# Patient Record
Sex: Female | Born: 2000 | Race: Black or African American | Hispanic: No | Marital: Single | Smoking: Never smoker
Health system: Southern US, Community
[De-identification: ages and names within clinical notes are randomized; demographics above are authoritative.]

## PROBLEM LIST (undated history)

## (undated) DIAGNOSIS — R569 Unspecified convulsions: Secondary | ICD-10-CM

## (undated) HISTORY — PX: LEG SURGERY: SHX1003

---
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
## 2018-04-04 ENCOUNTER — Encounter (HOSPITAL_COMMUNITY): Payer: Self-pay

## 2018-04-04 ENCOUNTER — Ambulatory Visit (HOSPITAL_COMMUNITY)
Admission: EM | Admit: 2018-04-04 | Discharge: 2018-04-04 | Disposition: A | Discharge status: Home / Self Care | Payer: Medicaid Other | Attending: Internal Medicine | Admitting: Internal Medicine

## 2018-04-04 DIAGNOSIS — N898 Other specified noninflammatory disorders of vagina: Secondary | ICD-10-CM

## 2018-04-04 DIAGNOSIS — L292 Pruritus vulvae: Secondary | ICD-10-CM | POA: Insufficient documentation

## 2018-04-04 DIAGNOSIS — R569 Unspecified convulsions: Secondary | ICD-10-CM | POA: Insufficient documentation

## 2018-04-04 DIAGNOSIS — Z79899 Other long term (current) drug therapy: Secondary | ICD-10-CM | POA: Insufficient documentation

## 2018-04-04 DIAGNOSIS — L243 Irritant contact dermatitis due to cosmetics: Secondary | ICD-10-CM

## 2018-04-04 LAB — POCT PREGNANCY, URINE: Preg Test, Ur: NEGATIVE

## 2018-04-04 LAB — POCT URINALYSIS DIP (DEVICE)
Bilirubin Urine: NEGATIVE
Glucose, UA: NEGATIVE mg/dL
Ketones, ur: NEGATIVE mg/dL
Leukocytes, UA: NEGATIVE
Nitrite: NEGATIVE
Protein, ur: NEGATIVE mg/dL
Specific Gravity, Urine: 1.025 (ref 1.005–1.030)
Urobilinogen, UA: 1 mg/dL (ref 0.0–1.0)
pH: 6.5 (ref 5.0–8.0)

## 2018-04-04 MED ORDER — HYDROCORTISONE 2.5 % EX LOTN: TOPICAL_LOTION | Freq: Two times a day (BID) | CUTANEOUS | 0 refills | Status: DC

## 2018-04-04 NOTE — Discharge Instructions (Addendum)
Cervical cytology obtained We will follow up with you regarding the results of your test If tests are positive, please abstain from sexual activity for at least 7 days and notify partners Follow up with PCP if symptoms persists Return here or go to ER if you have any new or worsening symptoms   Wash underarms with warm water and mild soap Hydrocortisone prescribed.  Used as directed for symptomatic relief Continue OTC medications as needed for symptomatic relief Follow up with PCP if symptoms persists

## 2018-04-04 NOTE — ED Provider Notes (Signed)
[facility]   [id number] 04/04/18 Arrival Time: 1708   SUBJECTIVE:  [name] is a 17 y.o. female hx significant for seizures who presents with complaints of vaginal itching and irriation for the past 2 months.  She denies a precipitating event, recent sexual encounter or recent antibiotic use.  Patient is sexually active with 1 female partner.  Does not offer when her last sexual encounter was.  She has NOT tried OTC medications.  She denies aggravating factors.  She denies similar symptoms in the past.  She denies fever, chills, nausea, vomiting, abdominal or pelvic pain, urinary symptoms, vaginal discharge, vaginal odor, vaginal bleeding, dyspareunia, vaginal rashes or lesions.   Patient also complains of rash to bilateral underarms for the past 2 months.  Symptoms began after she switched deodorant.  She localizes the rash to her to her bilateral underarms.  She describes it as itchy.  She has tried OTC lotion without relief.  Denies aggravating factors.  She denies similar symptoms in the past.    No LMP recorded (lmp unknown). Patient has had an implant. LMP this past week  ROS: As per HPI.  Past Medical History:  Diagnosis Date  . Seizure (HCC)   . Seizures (HCC)    Past Surgical History:  Procedure Laterality Date  . LEG SURGERY     Allergies  Allergen Reactions  . Codeine   . Latex   . Tdap [Tetanus-Diphth-Acell Pertussis]    No current facility-administered medications on file prior to encounter.    Current Outpatient Medications on File Prior to Encounter  Medication Sig Dispense Refill  . divalproex (DEPAKOTE SPRINKLE) 125 MG capsule Take 2 capsules (250 mg total) 2 (two) times daily by mouth. 120 capsule 1    Social History   Socioeconomic History  . Marital status: Single    Spouse name: Not on file  . Number of children: Not on file  . Years of education: Not on file  . Highest education level: Not on file  Occupational History  . Not on file    Social Needs  . Financial resource strain: Not on file  . Food insecurity:    Worry: Not on file    Inability: Not on file  . Transportation needs:    Medical: Not on file    Non-medical: Not on file  Tobacco Use  . Smoking status: Never Smoker  . Smokeless tobacco: Never Used  Substance and Sexual Activity  . Alcohol use: No  . Drug use: No  . Sexual activity: Not on file  Lifestyle  . Physical activity:    Days per week: Not on file    Minutes per session: Not on file  . Stress: Not on file  Relationships  . Social connections:    Talks on phone: Not on file    Gets together: Not on file    Attends religious service: Not on file    Active member of club or organization: Not on file    Attends meetings of clubs or organizations: Not on file    Relationship status: Not on file  . Intimate partner violence:    Fear of current or ex partner: Not on file    Emotionally abused: Not on file    Physically abused: Not on file    Forced sexual activity: Not on file  Other Topics Concern  . Not on file  Social History Narrative  . Not on file   History reviewed. No pertinent family history.  OBJECTIVE:  Vitals:   04/04/18 1832  BP: 117/76  Pulse: 73  Resp: 20  Temp: 98.3 F (36.8 C)  TempSrc: Temporal  SpO2: 100%     General appearance: alert, cooperative, appears stated age and no distress Throat: lips, mucosa, and tongue normal; teeth and gums normal Lungs: CTA bilaterally without adventitious breath sounds Heart: regular rate and rhythm.  Radial pulses 2+ symmetrical bilaterally Abdomen: soft, non-tender; bowel sounds normal; no masses or organomegaly; no guarding or rebound tenderness GU:  external examination without vulvar lesions or erythema Bimanual exam: Negative for cervical motion or adenexal tenderness; Speculum exam: Scant thin white/yellow discharge appreciated around cervix; Cervix visualized not friable. Scant blood seen from cervix os. Cervical swab  obtained Skin: warm and dry; hyperpigmented papular rash localized to bilateral axilla; nontender to palpation; no obvious drainage Psychological:  Alert and cooperative. Normal mood and affect.  LABS:   Labs Reviewed  POCT URINALYSIS DIP (DEVICE) - Abnormal; Notable for the following components:      Result Value   Hgb urine dipstick MODERATE (*)    All other components within normal limits  POCT PREGNANCY, URINE  CERVICOVAGINAL ANCILLARY ONLY    ASSESSMENT & PLAN:  1. Itching in the vaginal area   2. Irritant contact dermatitis due to cosmetics     Meds ordered this encounter  Medications  . hydrocortisone 2.5 % lotion    Sig: Apply topically 2 (two) times daily.    Dispense:  59 mL    Refill:  0    Order Specific Question:   Supervising Provider    Answer:   Isa RankinMURRAY, LAURA WILSON [[id number]]    Pending: Labs Reviewed  POCT URINALYSIS DIP (DEVICE) - Abnormal; Notable for the following components:      Result Value   Hgb urine dipstick MODERATE (*)    All other components within normal limits  POCT PREGNANCY, URINE  CERVICOVAGINAL ANCILLARY ONLY    Cervical cytology obtained We will follow up with you regarding the results of your test If tests are positive, please abstain from sexual activity for at least 7 days and notify partners Follow up with PCP if symptoms persists Return here or go to ER if you have any new or worsening symptoms   Wash underarms with warm water and mild soap Hydrocortisone prescribed.  Use as directed for symptomatic relief Continue OTC medications as needed for symptomatic relief Follow up with PCP if symptoms persists  Reviewed expectations re: course of current medical issues. Questions answered. Outlined signs and symptoms indicating need for more acute intervention. Patient verbalized understanding. After Visit Summary given.       Rennis HardingWurst, Salimatou Simone, [name] 04/04/18 2122

## 2018-04-04 NOTE — ED Triage Notes (Signed)
Pt presents with rash and irritation under both arms Pt also complains of vaginal irritation ( burning and itching)

## 2018-04-07 LAB — CERVICOVAGINAL ANCILLARY ONLY
Bacterial vaginitis: NEGATIVE
Candida vaginitis: POSITIVE — AB
Chlamydia: NEGATIVE
Neisseria Gonorrhea: NEGATIVE
Trichomonas: NEGATIVE

## 2018-04-08 ENCOUNTER — Telehealth (HOSPITAL_COMMUNITY): Payer: Self-pay

## 2018-04-08 MED ORDER — FLUCONAZOLE 150 MG PO TABS: 150.0000 mg | ORAL_TABLET | Freq: Every day | ORAL | 0 refills | Status: AC

## 2018-04-08 NOTE — Telephone Encounter (Signed)
Pt contacted regarding test for candida (yeast) was positive.  Prescription for fluconazole 150mg po now, repeat dose in 3d if needed, #2 no refills, sent to the pharmacy of record.  Recheck or followup with PCP for further evaluation if symptoms are not improving.  Answered all questions.  

## 2018-05-28 ENCOUNTER — Ambulatory Visit: Payer: Self-pay | Admitting: Family Medicine

## 2018-05-28 ENCOUNTER — Telehealth: Payer: Self-pay | Admitting: *Deleted

## 2018-05-28 NOTE — Telephone Encounter (Signed)
Contacted pt to ask her if she was wanting to just discuss removing her nexplanon or if she was wanting to have it removed and replaced, she said she would have to call her mom.  I called her back and she stated that her mom would be calling to make an appointment. If it is to remove and replace it needs to be scheduled for a procedure clinic.  PLease assit mom when she calls back to determine what she is seeking to have done. Lamonte SakaiZimmerman Rumple, Elide Stalzer D, [place]

## 2018-07-09 ENCOUNTER — Encounter (HOSPITAL_COMMUNITY): Payer: Self-pay | Admitting: Emergency Medicine

## 2018-07-09 ENCOUNTER — Ambulatory Visit (HOSPITAL_COMMUNITY)
Admission: EM | Admit: 2018-07-09 | Discharge: 2018-07-09 | Disposition: A | Discharge status: Home / Self Care | Payer: Self-pay | Attending: Family Medicine | Admitting: Family Medicine

## 2018-07-09 DIAGNOSIS — J22 Unspecified acute lower respiratory infection: Secondary | ICD-10-CM | POA: Insufficient documentation

## 2018-07-09 LAB — POCT RAPID STREP A: Streptococcus, Group A Screen (Direct): NEGATIVE

## 2018-07-09 MED ORDER — PSEUDOEPH-BROMPHEN-DM 30-2-10 MG/5ML PO SYRP: 5.0000 mL | ORAL_SOLUTION | Freq: Four times a day (QID) | ORAL | 0 refills | Status: DC | PRN

## 2018-07-09 MED ORDER — AZITHROMYCIN 250 MG PO TABS: 250.0000 mg | ORAL_TABLET | Freq: Every day | ORAL | 0 refills | Status: DC

## 2018-07-09 MED ORDER — CETIRIZINE HCL 10 MG PO CAPS: 10.0000 mg | ORAL_CAPSULE | Freq: Every day | ORAL | 0 refills | Status: DC

## 2018-07-09 MED ORDER — ONDANSETRON 4 MG PO TBDP: 4.0000 mg | ORAL_TABLET | Freq: Three times a day (TID) | ORAL | 0 refills | Status: DC | PRN

## 2018-07-09 NOTE — ED Triage Notes (Signed)
Pt sts cough and URI sx with some post tussive vomiting x 5 days

## 2018-07-09 NOTE — Discharge Instructions (Addendum)
Please begin taking azithromycin, 2 tablets today, 1 tablet for the following 4 days Please begin daily Zyrtec over the next 1 to 2 weeks Please use cough syrup provided as needed for cough May use Zofran as needed, dissolves underneath tongue, for nausea.  Please drink plenty of fluids  Follow-up if symptoms not improving or worsening.

## 2018-07-10 NOTE — ED Provider Notes (Signed)
[facility]    CSN: [id number] Arrival date & time: 07/09/18  1147     History   Chief Complaint Chief Complaint  Patient presents with  . Cough    HPI [name] is a 17 y.o. female history of febrile seizures presenting today for evaluation of cough and URI symptoms.  Patient states that she has had cough, congestion for the past 1-1/2 weeks.  Of recently her symptoms have led to posttussive emesis.  She denies any diarrhea.  Denies abdominal pain.  Occasional sore throat/scratchy sensation.  She has tried Warden/ranger as well as Zarbee's and honey lemon tea.  She denies fevers.  Denies chest pain or shortness of breath.  Her main complaint is the cough and persistence of symptoms.  Olene Floss has been monitoring her temperature, as in the past she has had many febrile seizures.  HPI  Past Medical History:  Diagnosis Date  . Seizure (HCC)   . Seizures [facility])     Patient Active Problem List   Diagnosis Date Noted  . Family history of brain cancer 05/20/2015  . 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 Well child check 04/26/2011  . DEVELOPMENTAL DELAY 08/02/2010  . Seizures (HCC) 10/31/2006    Past Surgical History:  Procedure Laterality Date  . LEG SURGERY      OB History   None      Home Medications    Prior to Admission medications   Medication Sig Start Date End Date Taking? Authorizing Provider  azithromycin (ZITHROMAX) 250 MG tablet Take 1 tablet (250 mg total) by mouth daily. Take first 2 tablets together, then 1 every day until finished. 07/09/18   Jeremiyah Cullens C, [name]  brompheniramine-pseudoephedrine-DM 30-2-10 MG/5ML syrup Take 5 mLs by mouth 4 (four) times daily as needed. 07/09/18   Azyah Flett C, [name]  Cetirizine HCl 10 MG CAPS Take 1 capsule (10 mg total) by mouth daily for 10 days. 07/09/18 07/19/18  Aaditya Letizia C, [name]  divalproex (DEPAKOTE SPRINKLE) 125 MG capsule Take 2 capsules (250 mg total) 2 (two) times daily by mouth.  07/19/17   Ward, Layla Maw, DO  hydrocortisone 2.5 % lotion Apply topically 2 (two) times daily. 04/04/18   Wurst, [place], [name]  ondansetron (ZOFRAN ODT) 4 MG disintegrating tablet Take 1 tablet (4 mg total) by mouth every 8 (eight) hours as needed for nausea or vomiting. 07/09/18   Era Parr, Junius Creamer, [name]    Family History History reviewed. No pertinent family history.  Social History Social History   Tobacco Use  . Smoking status: Never Smoker  . Smokeless tobacco: Never Used  Substance Use Topics  . Alcohol use: No  . Drug use: No     Allergies   Codeine; Latex; and Tdap [tetanus-diphth-acell pertussis]   Review of Systems Review of Systems  Constitutional: Negative for activity change, appetite change, chills, fatigue and fever.  HENT: Positive for congestion, rhinorrhea and sore throat. Negative for ear pain, sinus pressure and trouble swallowing.   Eyes: Negative for discharge and redness.  Respiratory: Positive for cough. Negative for chest tightness and shortness of breath.   Cardiovascular: Negative for chest pain.  Gastrointestinal: Positive for vomiting. Negative for abdominal pain, diarrhea and nausea.  Musculoskeletal: Negative for myalgias.  Skin: Negative for rash.  Neurological: Negative for dizziness, light-headedness and headaches.     Physical Exam Triage Vital Signs ED Triage Vitals  Enc Vitals Group     BP 07/09/18 1247 132/69  Pulse Rate 07/09/18 1247 78     Resp 07/09/18 1247 18     Temp 07/09/18 1247 97.9 F (36.6 C)     Temp Source 07/09/18 1247 Oral     SpO2 07/09/18 1247 98 %     Weight --      Height --      Head Circumference --      Peak Flow --      Pain Score 07/09/18 1248 4     Pain Loc --      Pain Edu? --      Excl. in GC? --    No data found.  Updated Vital Signs BP 132/69 (BP Location: Left Arm)   Pulse 78   Temp 97.9 F (36.6 C) (Oral)   Resp 18   SpO2 98%   Visual Acuity Right Eye Distance:   Left Eye  Distance:   Bilateral Distance:    Right Eye Near:   Left Eye Near:    Bilateral Near:     Physical Exam  Constitutional: She appears well-developed and well-nourished. No distress.  HENT:  Head: Normocephalic and atraumatic.  Bilateral ears without tenderness to palpation of external auricle, tragus and mastoid, EAC's without erythema or swelling, TM's with good bony landmarks and cone of light. Non erythematous.  Oral mucosa pink and moist, no tonsillar enlargement or exudate. Posterior pharynx patent and erythematous, no uvula deviation or swelling. Normal phonation.  Eyes: Conjunctivae are normal.  Neck: Neck supple.  Cardiovascular: Normal rate and regular rhythm.  No murmur heard. Pulmonary/Chest: Effort normal and breath sounds normal. No respiratory distress.  Breathing comfortably at rest, CTABL, no wheezing, rales or other adventitious sounds auscultated  Frequent dry cough in room  Abdominal: Soft. There is no tenderness.  Musculoskeletal: She exhibits no edema.  Neurological: She is alert.  Skin: Skin is warm and dry.  Psychiatric: She has a normal mood and affect.  Nursing note and vitals reviewed.    UC Treatments / Results  Labs (all labs ordered are listed, but only abnormal results are displayed) Labs Reviewed  CULTURE, GROUP A STREP [facility])  POCT RAPID STREP A    EKG None  Radiology No results found.  Procedures Procedures (including critical care time)  Medications Ordered in UC Medications - No data to display  Initial Impression / Assessment and Plan / UC Course  I have reviewed the triage vital signs and the nursing notes.  Pertinent labs & imaging results that were available during my care of the patient were reviewed by me and considered in my medical decision making (see chart for details).     Patient with 1/2 weeks of URI symptoms, will recommend continued symptomatic management with Zyrtec, cough syrup, will also provide  azithromycin to cover for atypical respiratory infection.  Zofran as needed for nausea.  Continue to monitor temperature, no fever in clinic today.  If developing fever or seizures to go to emergency room.Discussed strict return precautions. Patient verbalized understanding and is agreeable with plan.  Final Clinical Impressions(s) / UC Diagnoses   Final diagnoses:  Lower respiratory infection (e.g., bronchitis, pneumonia, pneumonitis, pulmonitis)     Discharge Instructions     Please begin taking azithromycin, 2 tablets today, 1 tablet for the following 4 days Please begin daily Zyrtec over the next 1 to 2 weeks Please use cough syrup provided as needed for cough May use Zofran as needed, dissolves underneath tongue, for nausea.  Please drink plenty of fluids  Follow-up if symptoms not improving or worsening.   ED Prescriptions    Medication Sig Dispense Auth. Provider   azithromycin (ZITHROMAX) 250 MG tablet Take 1 tablet (250 mg total) by mouth daily. Take first 2 tablets together, then 1 every day until finished. 6 tablet Cythina Mickelsen C, [name]   Cetirizine HCl 10 MG CAPS Take 1 capsule (10 mg total) by mouth daily for 10 days. 10 capsule Gaines Cartmell C, [name]   brompheniramine-pseudoephedrine-DM 30-2-10 MG/5ML syrup Take 5 mLs by mouth 4 (four) times daily as needed. 120 mL Kevina Piloto C, [name]   ondansetron (ZOFRAN ODT) 4 MG disintegrating tablet Take 1 tablet (4 mg total) by mouth every 8 (eight) hours as needed for nausea or vomiting. 20 tablet Terryann Verbeek, [place] C, [name]     Controlled Substance Prescriptions [place] Controlled Substance Registry consulted? Not Applicable   Lew Dawes, [place] 07/10/18 4098

## 2018-07-11 LAB — CULTURE, GROUP A STREP (THRC)

## 2018-10-15 ENCOUNTER — Ambulatory Visit (INDEPENDENT_AMBULATORY_CARE_PROVIDER_SITE_OTHER): Payer: Self-pay | Admitting: Family Medicine

## 2018-10-15 ENCOUNTER — Other Ambulatory Visit: Payer: Self-pay

## 2018-10-15 ENCOUNTER — Encounter: Payer: Self-pay | Admitting: Family Medicine

## 2018-10-15 VITALS — BP 100/82 | HR 102 | Temp 98.7°F | Ht 63.0 in | Wt 109.2 lb

## 2018-10-15 DIAGNOSIS — J029 Acute pharyngitis, unspecified: Secondary | ICD-10-CM

## 2018-10-15 DIAGNOSIS — J209 Acute bronchitis, unspecified: Secondary | ICD-10-CM

## 2018-10-15 LAB — POCT RAPID STREP A (OFFICE): Rapid Strep A Screen: NEGATIVE

## 2018-10-15 MED ORDER — AZITHROMYCIN 250 MG PO TABS: ORAL_TABLET | ORAL | 0 refills | Status: DC

## 2018-10-15 MED ORDER — BENZONATATE 200 MG PO CAPS: 200.0000 mg | ORAL_CAPSULE | Freq: Two times a day (BID) | ORAL | 0 refills | Status: DC | PRN

## 2018-10-15 MED ORDER — LORATADINE 10 MG PO TABS
10.0000 mg | ORAL_TABLET | Freq: Every day | ORAL | 3 refills | Status: DC
Start: 2018-10-15 — End: 2021-01-28

## 2018-10-15 MED ORDER — DIVALPROEX SODIUM 125 MG PO CSDR: 250.0000 mg | DELAYED_RELEASE_CAPSULE | Freq: Two times a day (BID) | ORAL | 1 refills | Status: DC

## 2018-10-15 NOTE — Patient Instructions (Signed)
Great to see you!   

## 2018-10-17 NOTE — Progress Notes (Signed)
    CHIEF COMPLAINT / HPI: Cough, sore throat and general fatigue fors everal days. Started with sore throat which ahs resolved but loasy 8 days cough getting worse. Was treated with Tamiflu initially.  REVIEW OF SYSTEMS: Fever initially but none inlast 7 days. Cough productive scant green sputum.Muscle aches resolving. Fatigue for 8 days. No rash  PERTINENT  PMH / PSH: I have reviewed the patient's medications, allergies, past medical and surgical history, smoking status and updated in the EMR as appropriate.   OBJECTIVE:  Vital signs reviewed. GENERAL: Well-developed, well-nourished, no acute distress. CARDIOVASCULAR: Regular rate and rhythm no murmur gallop or rub LUNGS: Clear to auscultation bilaterally, no rales or wheeze.Coarse breath sounds on ciough OP: no erythema or exudate. NECK no LAD ABDOMEN: Soft positive bowel sounds NEURO: No gross focal neurological deficits. MSK: Movement of extremity x 4.    ASSESSMENT / PLAN:  Bronchitis Recently treated for influenza  Will treat witth azithromycin.  She is due WCC and Nexplanon removal with re-insetrion so will schedyule her for that and f/u of current issues Refilled depakote Will get blood work at next visit as she has not been takingmedicine  regularly

## 2018-11-04 ENCOUNTER — Ambulatory Visit (INDEPENDENT_AMBULATORY_CARE_PROVIDER_SITE_OTHER): Payer: Medicaid Other | Admitting: Pediatrics

## 2018-11-05 ENCOUNTER — Encounter: Payer: Self-pay | Admitting: Family Medicine

## 2018-11-12 ENCOUNTER — Ambulatory Visit (INDEPENDENT_AMBULATORY_CARE_PROVIDER_SITE_OTHER): Payer: Medicaid Other | Admitting: Pediatrics

## 2018-11-12 ENCOUNTER — Encounter (INDEPENDENT_AMBULATORY_CARE_PROVIDER_SITE_OTHER): Payer: Self-pay | Admitting: Pediatrics

## 2018-11-12 ENCOUNTER — Other Ambulatory Visit: Payer: Self-pay

## 2018-11-12 VITALS — BP 102/74 | HR 74 | Temp 97.9°F | Ht 64.37 in | Wt 109.8 lb

## 2018-11-12 DIAGNOSIS — T7622XA Child sexual abuse, suspected, initial encounter: Secondary | ICD-10-CM

## 2018-11-12 DIAGNOSIS — N898 Other specified noninflammatory disorders of vagina: Secondary | ICD-10-CM

## 2018-11-12 DIAGNOSIS — T7621XA Adult sexual abuse, suspected, initial encounter: Secondary | ICD-10-CM | POA: Diagnosis not present
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
## 2018-11-17 LAB — CHLAMYDIA/GONOCOCCUS/TRICHOMONAS, NAA
Chlamydia by NAA: NEGATIVE
Gonococcus by NAA: NEGATIVE
Trich vag by NAA: NEGATIVE

## 2018-11-20 ENCOUNTER — Other Ambulatory Visit: Payer: Self-pay

## 2018-11-20 ENCOUNTER — Emergency Department (HOSPITAL_COMMUNITY)
Admission: EM | Admit: 2018-11-20 | Discharge: 2018-11-20 | Disposition: A | Discharge status: Home / Self Care | Payer: Medicaid Other | Attending: Emergency Medicine | Admitting: Emergency Medicine

## 2018-11-20 DIAGNOSIS — R451 Restlessness and agitation: Secondary | ICD-10-CM | POA: Diagnosis not present

## 2018-11-20 DIAGNOSIS — E876 Hypokalemia: Secondary | ICD-10-CM | POA: Diagnosis not present

## 2018-11-20 LAB — CBC WITH DIFFERENTIAL/PLATELET
Abs Immature Granulocytes: 0.03 10*3/uL (ref 0.00–0.07)
Basophils Absolute: 0 10*3/uL (ref 0.0–0.1)
Basophils Relative: 0 %
Eosinophils Absolute: 0.1 10*3/uL (ref 0.0–0.5)
Eosinophils Relative: 1 %
HCT: 38.4 % (ref 36.0–46.0)
Hemoglobin: 12.2 g/dL (ref 12.0–15.0)
Immature Granulocytes: 0 %
Lymphocytes Relative: 20 %
Lymphs Abs: 1.6 10*3/uL (ref 0.7–4.0)
MCH: 27.9 pg (ref 26.0–34.0)
MCHC: 31.8 g/dL (ref 30.0–36.0)
MCV: 87.9 fL (ref 80.0–100.0)
Monocytes Absolute: 0.3 10*3/uL (ref 0.1–1.0)
Monocytes Relative: 4 %
Neutro Abs: 6 10*3/uL (ref 1.7–7.7)
Neutrophils Relative %: 75 %
Platelets: 315 10*3/uL (ref 150–400)
RBC: 4.37 MIL/uL (ref 3.87–5.11)
RDW: 12.3 % (ref 11.5–15.5)
WBC: 8 10*3/uL (ref 4.0–10.5)
nRBC: 0 % (ref 0.0–0.2)

## 2018-11-20 LAB — COMPREHENSIVE METABOLIC PANEL
ALT: 12 U/L (ref 0–44)
AST: 27 U/L (ref 15–41)
Albumin: 4.1 g/dL (ref 3.5–5.0)
Alkaline Phosphatase: 53 U/L (ref 38–126)
Anion gap: 17 — ABNORMAL HIGH (ref 5–15)
BUN: 18 mg/dL (ref 6–20)
CO2: 18 mmol/L — ABNORMAL LOW (ref 22–32)
Calcium: 9.6 mg/dL (ref 8.9–10.3)
Chloride: 106 mmol/L (ref 98–111)
Creatinine, Ser: 1.23 mg/dL — ABNORMAL HIGH (ref 0.44–1.00)
GFR calc Af Amer: >60 mL/min (ref >60)
GFR calc non Af Amer: >60 mL/min (ref >60)
Glucose, Bld: 149 mg/dL — ABNORMAL HIGH (ref 70–99)
Potassium: 3 mmol/L — ABNORMAL LOW (ref 3.5–5.1)
Sodium: 141 mmol/L (ref 135–145)
Total Bilirubin: 0.7 mg/dL (ref 0.3–1.2)
Total Protein: 7.3 g/dL (ref 6.5–8.1)

## 2018-11-20 LAB — I-STAT BETA HCG BLOOD, ED (MC, WL, AP ONLY): I-stat hCG, quantitative: <5 m[IU]/mL (ref <5)

## 2018-11-20 LAB — ACETAMINOPHEN LEVEL: Acetaminophen (Tylenol), Serum: <10 ug/mL — ABNORMAL LOW (ref 10–30)

## 2018-11-20 LAB — ETHANOL: Alcohol, Ethyl (B): <10 mg/dL (ref <10)

## 2018-11-20 LAB — SALICYLATE LEVEL: Salicylate Lvl: <7 mg/dL (ref 2.8–30.0)

## 2018-11-20 MED ORDER — HALOPERIDOL LACTATE 5 MG/ML IJ SOLN
5.0000 mg | Freq: Once | INTRAMUSCULAR | Status: AC
  Administered 2018-11-20: 5 mg via INTRAMUSCULAR

## 2018-11-20 MED ORDER — POTASSIUM CHLORIDE ER 20 MEQ PO TBCR: 10.0000 meq | EXTENDED_RELEASE_TABLET | Freq: Every day | ORAL | 0 refills | Status: DC

## 2018-11-20 NOTE — ED Triage Notes (Signed)
Pt here via EMS after behavioral pseudoseizure activity with large crowd around her videoing. EMS arrival and gave 5 of midazolam IM. Pt arrives to ED screaming profanity at staff, attempting to spit on staff, and requiring four people to keep her from flailing off the bed. Pt uncooperative with triage assessment, screaming "it ain't none of your business, bitch," "I will slap the fuck out of all of y'all."
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
## 2018-11-21 ENCOUNTER — Emergency Department (HOSPITAL_COMMUNITY): Payer: Medicaid Other

## 2018-11-21 ENCOUNTER — Other Ambulatory Visit: Payer: Self-pay

## 2018-11-21 ENCOUNTER — Emergency Department (HOSPITAL_COMMUNITY)
Admission: EM | Admit: 2018-11-21 | Discharge: 2018-11-22 | Disposition: A | Discharge status: Home / Self Care | Payer: Medicaid Other | Attending: Emergency Medicine | Admitting: Emergency Medicine

## 2018-11-21 DIAGNOSIS — R404 Transient alteration of awareness: Secondary | ICD-10-CM

## 2018-11-21 DIAGNOSIS — R000 Tachycardia, unspecified: Secondary | ICD-10-CM | POA: Diagnosis not present

## 2018-11-21 DIAGNOSIS — R569 Unspecified convulsions: Secondary | ICD-10-CM | POA: Diagnosis not present

## 2018-11-21 DIAGNOSIS — Z8669 Personal history of other diseases of the nervous system and sense organs: Secondary | ICD-10-CM | POA: Diagnosis not present

## 2018-11-21 DIAGNOSIS — R4182 Altered mental status, unspecified: Secondary | ICD-10-CM | POA: Diagnosis not present

## 2018-11-21 DIAGNOSIS — G40909 Epilepsy, unspecified, not intractable, without status epilepticus: Secondary | ICD-10-CM | POA: Diagnosis present

## 2018-11-21 LAB — CBC WITH DIFFERENTIAL/PLATELET
Abs Immature Granulocytes: 0.02 10*3/uL (ref 0.00–0.07)
Basophils Absolute: 0 10*3/uL (ref 0.0–0.1)
Basophils Relative: 0 %
Eosinophils Absolute: 0 10*3/uL (ref 0.0–0.5)
Eosinophils Relative: 0 %
HCT: 38.5 % (ref 36.0–46.0)
Hemoglobin: 12.4 g/dL (ref 12.0–15.0)
Immature Granulocytes: 0 %
Lymphocytes Relative: 9 %
Lymphs Abs: 0.7 10*3/uL (ref 0.7–4.0)
MCH: 29 pg (ref 26.0–34.0)
MCHC: 32.2 g/dL (ref 30.0–36.0)
MCV: 90.2 fL (ref 80.0–100.0)
Monocytes Absolute: 0.3 10*3/uL (ref 0.1–1.0)
Monocytes Relative: 4 %
Neutro Abs: 6.3 10*3/uL (ref 1.7–7.7)
Neutrophils Relative %: 87 %
Platelets: 288 10*3/uL (ref 150–400)
RBC: 4.27 MIL/uL (ref 3.87–5.11)
RDW: 12.7 % (ref 11.5–15.5)
WBC: 7.3 10*3/uL (ref 4.0–10.5)
nRBC: 0 % (ref 0.0–0.2)

## 2018-11-21 LAB — COMPREHENSIVE METABOLIC PANEL
ALT: 14 U/L (ref 0–44)
AST: 27 U/L (ref 15–41)
Albumin: 4.1 g/dL (ref 3.5–5.0)
Alkaline Phosphatase: 54 U/L (ref 38–126)
Anion gap: 10 (ref 5–15)
BUN: 16 mg/dL (ref 6–20)
CO2: 23 mmol/L (ref 22–32)
Calcium: 9.6 mg/dL (ref 8.9–10.3)
Chloride: 106 mmol/L (ref 98–111)
Creatinine, Ser: 0.9 mg/dL (ref 0.44–1.00)
GFR calc Af Amer: >60 mL/min (ref >60)
GFR calc non Af Amer: >60 mL/min (ref >60)
Glucose, Bld: 110 mg/dL — ABNORMAL HIGH (ref 70–99)
Potassium: 3.5 mmol/L (ref 3.5–5.1)
Sodium: 139 mmol/L (ref 135–145)
Total Bilirubin: 0.5 mg/dL (ref 0.3–1.2)
Total Protein: 7.5 g/dL (ref 6.5–8.1)

## 2018-11-21 LAB — I-STAT BETA HCG BLOOD, ED (MC, WL, AP ONLY): I-stat hCG, quantitative: <5 m[IU]/mL (ref <5)

## 2018-11-21 LAB — CBG MONITORING, ED: Glucose-Capillary: 80 mg/dL (ref 70–99)

## 2018-11-21 LAB — ETHANOL: Alcohol, Ethyl (B): <10 mg/dL (ref <10)

## 2018-11-21 LAB — VALPROIC ACID LEVEL: Valproic Acid Lvl: 25 ug/mL — ABNORMAL LOW (ref 50.0–100.0)

## 2018-11-21 MED ORDER — LORAZEPAM 2 MG/ML IJ SOLN
1.0000 mg | Freq: Once | INTRAMUSCULAR | Status: AC
  Administered 2018-11-21: 1 mg via INTRAVENOUS
  Filled 2018-11-21: qty 1

## 2018-11-21 MED ORDER — VALPROATE SODIUM 500 MG/5ML IV SOLN
250.0000 mg | Freq: Once | INTRAVENOUS | Status: AC
  Administered 2018-11-22: 250 mg via INTRAVENOUS
  Filled 2018-11-21: qty 2.5

## 2018-11-21 NOTE — ED Notes (Signed)
Dr Jacqulyn Bath made aware of family request to speak with him.

## 2018-11-21 NOTE — ED Notes (Signed)
Pt. To CT

## 2018-11-21 NOTE — ED Provider Notes (Signed)
Emergency Department Provider Note   I have reviewed the triage vital signs and the nursing notes.   HISTORY  Chief Complaint Seizures   HPI [name] is a 18 y.o. female with PMH of seizure including non-epileptiform seizure presents to the emergency department by EMS.  She was found unresponsive on her knees by her mother.  EMS notes a history of seizure but report unclear regarding compliance with medications.  She was seen in the emergency department yesterday with agitation and what sounded like a pseudoseizure on scene.  She required Haldol at that time and restraint.   Level 5 caveat: Patient following commands but not speaking. Moaning at times.   Past Medical History:  Diagnosis Date  . Seizure (HCC)   . Seizures [facility])     Patient Active Problem List   Diagnosis Date Noted  . Family history of brain cancer 05/20/2015  . [name] term use of drug 09/26/2011  . Well child check 04/26/2011  . DEVELOPMENTAL DELAY 08/02/2010  . Seizures (HCC) 10/31/2006    Past Surgical History:  Procedure Laterality Date  . LEG SURGERY     Allergies Codeine; Latex; and Tdap [tetanus-diphth-acell pertussis]  No family history on file.  Social History Social History   Tobacco Use  . Smoking status: Never Smoker  . Smokeless tobacco: Never Used  Substance Use Topics  . Alcohol use: No  . Drug use: No    Review of Systems  Level 5 caveat: Non-verbal  ____________________________________________   PHYSICAL EXAM:  VITAL SIGNS: Vitals:   11/22/18 0000 11/22/18 0020  BP: 101/65   Pulse: 78 97  Resp: 16 19  Temp:    SpO2: 100% 100%   Constitutional: Alert and making eye contact but not speaking. Well appearing and in no acute distress. Eyes: Conjunctivae are normal.  Head: Atraumatic. Nose: No congestion/rhinnorhea. Mouth/Throat: Mucous membranes are moist.  Neck: No stridor. Cardiovascular: Normal rate, regular rhythm. Good peripheral circulation. Grossly  normal heart sounds.   Respiratory: Normal respiratory effort.  No retractions. Lungs CTAB. Gastrointestinal: Soft and nontender. No distention.  Musculoskeletal: No lower extremity tenderness nor edema. No gross deformities of extremities. Neurologic:  Normal speech and language. No gross focal neurologic deficits are appreciated.  Skin:  Skin is warm, dry and intact. No rash noted.  ____________________________________________   LABS (all labs ordered are listed, but only abnormal results are displayed)  Labs Reviewed  COMPREHENSIVE METABOLIC PANEL - Abnormal; Notable for the following components:      Result Value   Glucose, Bld 110 (*)    All other components within normal limits  VALPROIC ACID LEVEL - Abnormal; Notable for the following components:   Valproic Acid Lvl 25 (*)    All other components within normal limits  ETHANOL  CBC WITH DIFFERENTIAL/PLATELET  URINALYSIS, ROUTINE W REFLEX MICROSCOPIC  RAPID URINE DRUG SCREEN, HOSP PERFORMED  CBG MONITORING, ED  I-STAT BETA HCG BLOOD, ED ([facility], [facility], [facility] ONLY)  I-STAT BETA HCG BLOOD, ED ([facility], [facility], [facility] ONLY)   ____________________________________________  EKG   EKG Interpretation  Date/Time:  Friday November 21 2018 20:43:58 EDT Ventricular Rate:  92 PR Interval:    QRS Duration: 92 QT Interval:  376 QTC Calculation: 466 R Axis:   73 Text Interpretation:  Sinus rhythm RSR' in V1 or V2, probably normal variant No STEMI.  Confirmed by Alona Bene [phone]) on 11/21/2018 8:59:45 PM       ____________________________________________  RADIOLOGY  Ct Head Wo Contrast  Result Date: 11/21/2018 CLINICAL DATA:  Found unresponsive. Has history of seizures. EXAM: CT HEAD WITHOUT CONTRAST TECHNIQUE: Contiguous axial images were obtained from the base of the skull through the vertex without intravenous contrast. COMPARISON:  None available. FINDINGS: Brain: No evidence of acute infarction, hemorrhage, hydrocephalus, extra-axial collection  or mass lesion/mass effect. Vascular: No hyperdense vessel or unexpected calcification. Skull: Normal. Negative for fracture or focal lesion. Sinuses/Orbits: No acute finding. Other: None. IMPRESSION: No acute intracranial abnormality. Electronically Signed   By: Ted Mcalpine M.D.   On: 11/21/2018 21:44    ____________________________________________   PROCEDURES  Procedure(s) performed:   Procedures  None ____________________________________________   INITIAL IMPRESSION / ASSESSMENT AND PLAN / ED COURSE  Pertinent labs & imaging results that were available during my care of the patient were reviewed by me and considered in my medical decision making (see chart for details).  Patient arrives to the emergency department after being found unresponsive in her room.  EMS questioning seizure versus pseudoseizure activity at home.  No seizure-like activity with EMS.  Patient is nonverbal here but making eye contact and following commands.   10:30 PM  CT imaging and lab work reviewed.  Discussed the case with Dr. Lyman Bishop.  Plan for labs in the emergency department.  Will give Ativan here and send Depakote level.  Likely increase Depakote at discharge if patient returns to baseline.  If not, plan for admit and EEG in the morning.   11:40 PM  Patient sleeping. Vitals are normal. Updated grandma. Plan for obs in the ED.   Care transferred to Dr. Clayborne Dana.  ____________________________________________  FINAL CLINICAL IMPRESSION(S) / ED DIAGNOSES  Final diagnoses:  Seizure-like activity (HCC)  Transient alteration of awareness     MEDICATIONS GIVEN DURING THIS VISIT:  Medications  valproate (DEPACON) 250 mg in dextrose 5 % 50 mL IVPB (250 mg Intravenous New Bag/Given 11/22/18 0018)  LORazepam (ATIVAN) injection 1 mg (1 mg Intravenous Given 11/21/18 2231)    Note:  This document was prepared using Dragon voice recognition software and may include unintentional dictation  errors.  Alona Bene, MD Emergency Medicine    Meeyah Ovitt, Arlyss Repress, MD 11/22/18 [phone]

## 2018-11-21 NOTE — ED Triage Notes (Signed)
Pt. Arrived via EMS. Per EMS pt found on knees in room and unresponsive. Pt. Has HX of seizures and uncertain of pts. Compliance with medications. Pt. Was seen yesterday c/o seizures.   Pt. Currently is unresponsive and following commands.

## 2018-11-22 MED ORDER — DIVALPROEX SODIUM 125 MG PO CSDR: 375.0000 mg | DELAYED_RELEASE_CAPSULE | Freq: Two times a day (BID) | ORAL | 1 refills | Status: DC

## 2018-11-22 NOTE — ED Notes (Addendum)
Discharge instructions and prescription discussed with pt and grandmother at bedside. Pt. Is now verbal and able to communicate needs. Pt. Verbalized no other questions at this time. Pt discharged home via wheelchair with grandmother.

## 2018-12-08 ENCOUNTER — Other Ambulatory Visit: Payer: Self-pay | Admitting: *Deleted

## 2018-12-08 ENCOUNTER — Other Ambulatory Visit: Payer: Self-pay | Admitting: Family Medicine

## 2018-12-09 MED ORDER — DIVALPROEX SODIUM 125 MG PO CSDR: 375.0000 mg | DELAYED_RELEASE_CAPSULE | Freq: Two times a day (BID) | ORAL | 1 refills | Status: DC

## 2019-04-22 DIAGNOSIS — H5212 Myopia, left eye: Secondary | ICD-10-CM | POA: Diagnosis not present

## 2019-07-06 ENCOUNTER — Other Ambulatory Visit: Payer: Self-pay

## 2019-07-07 MED ORDER — DIVALPROEX SODIUM 125 MG PO CSDR: DELAYED_RELEASE_CAPSULE | ORAL | 1 refills | Status: DC

## 2019-07-26 ENCOUNTER — Ambulatory Visit (INDEPENDENT_AMBULATORY_CARE_PROVIDER_SITE_OTHER): Payer: Medicaid Other

## 2019-07-26 ENCOUNTER — Other Ambulatory Visit: Payer: Self-pay

## 2019-07-26 ENCOUNTER — Ambulatory Visit
Admission: EM | Admit: 2019-07-26 | Discharge: 2019-07-26 | Disposition: A | Discharge status: Home / Self Care | Payer: Medicaid Other | Attending: Emergency Medicine | Admitting: Emergency Medicine

## 2019-07-26 ENCOUNTER — Encounter: Payer: Self-pay | Admitting: Emergency Medicine

## 2019-07-26 DIAGNOSIS — X501XXA Overexertion from prolonged static or awkward postures, initial encounter: Secondary | ICD-10-CM

## 2019-07-26 DIAGNOSIS — S93401A Sprain of unspecified ligament of right ankle, initial encounter: Secondary | ICD-10-CM

## 2019-07-26 DIAGNOSIS — M25571 Pain in right ankle and joints of right foot: Secondary | ICD-10-CM | POA: Diagnosis not present

## 2019-07-26 NOTE — ED Notes (Signed)
Patient able to ambulate independently  

## 2019-07-26 NOTE — ED Provider Notes (Signed)
[name]    CSN: [id number] Arrival date & time: 07/26/19  1429      History   Chief Complaint Chief Complaint  Patient presents with  . Ankle Pain    HPI [name] is a 18 y.o. female with history of seizures presenting for right ankle pain after inversion injury down some steps earlier today.  Patient denies head trauma, LOC, seizure preceding this event.  Has not tried thing for symptoms.  States pain is worse with ambulation, though can bear weight.   Past Medical History:  Diagnosis Date  . Seizure ([place])   . Seizures [facility])     Patient Active Problem List   Diagnosis Date Noted  . Family history of brain cancer 05/20/2015  . 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 Well child check 04/26/2011  . DEVELOPMENTAL DELAY 08/02/2010  . Seizures ([place]) 10/31/2006    Past Surgical History:  Procedure Laterality Date  . LEG SURGERY      OB History   No obstetric history on file.      Home Medications    Prior to Admission medications   Medication Sig Start Date End Date Taking? Authorizing Provider  divalproex (DEPAKOTE SPRINKLE) 125 MG capsule Take 3 capsules (375 mg total) by mouth 2 (two) times daily. 12/09/18   Dickie La, MD  divalproex (DEPAKOTE SPRINKLE) 125 MG capsule TAKE 2 CAPSULES (250 MG TOTAL) BY MOUTH 2 (TWO) TIMES DAILY. 07/07/19   Dickie La, MD  hydrocortisone 2.5 % lotion Apply topically 2 (two) times daily. 04/04/18   Wurst, [place], [name]  loratadine (CLARITIN) 10 MG tablet Take 1 tablet (10 mg total) by mouth daily. 10/15/18   Dickie La, MD  potassium chloride 20 MEQ TBCR Take 10 mEq by mouth daily. 11/20/18   Little, Wenda Overland, MD    Family History Family History  Problem Relation Age of Onset  . Healthy Mother   . Seizures Father     Social History Social History   Tobacco Use  . Smoking status: Never Smoker  . Smokeless tobacco: Never Used  Substance Use Topics  . Alcohol use: No  . Drug use: No      Allergies   Codeine, Latex, and Tdap [tetanus-diphth-acell pertussis]   Review of Systems Review of Systems  Constitutional: Negative for fatigue and fever.  Respiratory: Negative for cough and shortness of breath.   Cardiovascular: Negative for chest pain and palpitations.  Musculoskeletal:       Positive for right ankle pain  Neurological: Negative for weakness and numbness.     Physical Exam Triage Vital Signs ED Triage Vitals  Enc Vitals Group     BP      Pulse      Resp      Temp      Temp src      SpO2      Weight      Height      Head Circumference      Peak Flow      Pain Score      Pain Loc      Pain Edu?      Excl. in [place]?    No data found.  Updated Vital Signs BP 115/73 (BP Location: Left Arm)   Pulse 71   Temp 98 F (36.7 C) (Oral)   Resp 16   SpO2 100%   Visual Acuity Right Eye Distance:   Left Eye Distance:  Bilateral Distance:    Right Eye Near:   Left Eye Near:    Bilateral Near:     Physical Exam Constitutional:      General: She is not in acute distress. HENT:     Head: Normocephalic and atraumatic.  Eyes:     General: No scleral icterus.    Pupils: Pupils are equal, round, and reactive to light.  Cardiovascular:     Rate and Rhythm: Normal rate.  Pulmonary:     Effort: Pulmonary effort is normal.  Musculoskeletal:     Right ankle: She exhibits normal range of motion, no swelling, no ecchymosis, no deformity and normal pulse. Tenderness. Lateral malleolus tenderness found. No medial malleolus and no head of 5th metatarsal tenderness found. Achilles tendon exhibits no pain and no defect.  Skin:    Coloration: Skin is not jaundiced or pale.  Neurological:     Mental Status: She is alert and oriented to person, place, and time.      UC Treatments / Results  Labs (all labs ordered are listed, but only abnormal results are displayed) Labs Reviewed - No data to display  EKG   Radiology Dg Ankle Complete  Right  Result Date: 07/26/2019 CLINICAL DATA:  Patient fell yesterday.  Right ankle pain. EXAM: RIGHT ANKLE - COMPLETE 3+ VIEW COMPARISON:  None. FINDINGS: No evidence for an acute fracture. No subluxation or dislocation. No substantial soft tissue swelling. IMPRESSION: Negative. Electronically Signed   By: Kennith Center M.D.   On: 07/26/2019 14:53    Procedures Procedures (including critical care time)  Medications Ordered in UC Medications - No data to display  Initial Impression / Assessment and Plan / UC Course  I have reviewed the triage vital signs and the nursing notes.  Pertinent labs & imaging results that were available during my care of the patient were reviewed by me and considered in my medical decision making (see chart for details).     Foot x-ray attended office, reviewed by me and radiology: Negative for subluxation, dislocation, fracture.  Ace wrap applied in office which patient tolerated well.  Will practice RICE, ankle exercises that were provided at time of appointment.  Return precautions discussed, patient verbalized understanding and is agreeable to plan. Final Clinical Impressions(s) / UC Diagnoses   Final diagnoses:  Sprain of right ankle, unspecified ligament, initial encounter     Discharge Instructions     Part to do exercises once daily. Recommend RICE: rest, ice, compression, elevation as needed for pain.   Cold therapy (ice packs) can be used to help swelling both after injury and after prolonged use of areas of chronic pain/aches.  For pain: recommend 350 mg-1000 mg of Tylenol (acetaminophen) and/or 200 mg - 800 mg of Advil (ibuprofen, Motrin) every 8 hours as needed.  May alternate between the two throughout the day as they are generally safe to take together.  DO NOT exceed more than 3000 mg of Tylenol or 3200 mg of ibuprofen in a 24 hour period as this could damage your stomach, kidneys, liver, or increase your bleeding risk.  Return for  worsening pain, swelling, inability to bear weight, reinjury.    ED Prescriptions    None     PDMP not reviewed this encounter.   Hall-Potvin, [place], [place] 07/26/19 1511

## 2019-07-26 NOTE — Discharge Instructions (Addendum)
Part to do exercises once daily. Recommend RICE: rest, ice, compression, elevation as needed for pain.   Cold therapy (ice packs) can be used to help swelling both after injury and after prolonged use of areas of chronic pain/aches.  For pain: recommend 350 mg-1000 mg of Tylenol (acetaminophen) and/or 200 mg - 800 mg of Advil (ibuprofen, Motrin) every 8 hours as needed.  May alternate between the two throughout the day as they are generally safe to take together.  DO NOT exceed more than 3000 mg of Tylenol or 3200 mg of ibuprofen in a 24 hour period as this could damage your stomach, kidneys, liver, or increase your bleeding risk.  Return for worsening pain, swelling, inability to bear weight, reinjury.

## 2019-07-26 NOTE — ED Triage Notes (Signed)
Pt presents to [facility] for assessment after she slipped down the a few steps, twisting her ankle.  Pt c/o external ankle pain.  No swelling noted at triage.

## 2019-11-04 ENCOUNTER — Ambulatory Visit
Admission: EM | Admit: 2019-11-04 | Discharge: 2019-11-04 | Disposition: A | Discharge status: Home / Self Care | Payer: Medicaid Other | Attending: Emergency Medicine | Admitting: Emergency Medicine

## 2019-11-04 DIAGNOSIS — M545 Low back pain, unspecified: Secondary | ICD-10-CM

## 2019-11-04 DIAGNOSIS — R110 Nausea: Secondary | ICD-10-CM | POA: Diagnosis not present

## 2019-11-04 LAB — POCT URINALYSIS DIP (MANUAL ENTRY)
Bilirubin, UA: NEGATIVE
Glucose, UA: NEGATIVE mg/dL
Ketones, POC UA: NEGATIVE mg/dL
Leukocytes, UA: NEGATIVE
Nitrite, UA: NEGATIVE
Protein Ur, POC: NEGATIVE mg/dL
Spec Grav, UA: <=1.005 — AB (ref 1.010–1.025)
Urobilinogen, UA: 0.2 E.U./dL
pH, UA: 6.5 (ref 5.0–8.0)

## 2019-11-04 MED ORDER — OMEPRAZOLE 20 MG PO CPDR: 20.0000 mg | DELAYED_RELEASE_CAPSULE | Freq: Every day | ORAL | 0 refills | Status: DC

## 2019-11-04 MED ORDER — MELOXICAM 7.5 MG PO TABS: 7.5000 mg | ORAL_TABLET | Freq: Every day | ORAL | 0 refills | Status: DC

## 2019-11-04 MED ORDER — ONDANSETRON 4 MG PO TBDP
4.0000 mg | ORAL_TABLET | Freq: Once | ORAL | Status: AC
  Administered 2019-11-04: 4 mg via ORAL

## 2019-11-04 MED ORDER — ONDANSETRON HCL 4 MG PO TABS: 4.0000 mg | ORAL_TABLET | Freq: Four times a day (QID) | ORAL | 0 refills | Status: DC

## 2019-11-04 NOTE — Discharge Instructions (Signed)
Take pain medication once daily: May take Tylenol as well if needed. May use ice/heat for additional pain relief. Recommend taking Prilosec every day as this can help treat nausea. Take Zofran as needed for severe nausea. Return for worsening symptoms, fever, difficulty breathing, or chest pain.

## 2019-11-04 NOTE — ED Provider Notes (Signed)
[name]    CSN: [id number] Arrival date & time: 11/04/19  1633      History   Chief Complaint Chief Complaint  Patient presents with  . Sore Throat    HPI [name] is a 19 y.o. female with history of seizures presenting for periumbilical pain, low back pain, sore throat since yesterday.  Patient has not tried any interventions for sore throat.  Denies difficulty chewing, swallowing, breathing.  No chest pain, fever, known sick contacts.  Does endorse nausea: Denies emesis, diarrhea.  Has been burping more than baseline with some reflux.  Patient not currently sexually active: Denies vaginal discharge or bleeding.  No urinary symptoms such as urgency, frequency, hematuria.  Past Medical History:  Diagnosis Date  . Seizure (HCC)   . Seizures [facility])     Patient Active Problem List   Diagnosis Date Noted  . Family history of brain cancer 05/20/2015  . 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 Well child check 04/26/2011  . DEVELOPMENTAL DELAY 08/02/2010  . Seizures (HCC) 10/31/2006    Past Surgical History:  Procedure Laterality Date  . LEG SURGERY      OB History   No obstetric history on file.      Home Medications    Prior to Admission medications   Medication Sig Start Date End Date Taking? Authorizing Provider  divalproex (DEPAKOTE SPRINKLE) 125 MG capsule Take 3 capsules (375 mg total) by mouth 2 (two) times daily. 12/09/18   Nestor Ramp, MD  divalproex (DEPAKOTE SPRINKLE) 125 MG capsule TAKE 2 CAPSULES (250 MG TOTAL) BY MOUTH 2 (TWO) TIMES DAILY. 07/07/19   Nestor Ramp, MD  hydrocortisone 2.5 % lotion Apply topically 2 (two) times daily. 04/04/18   Wurst, [place], [name]  loratadine (CLARITIN) 10 MG tablet Take 1 tablet (10 mg total) by mouth daily. 10/15/18   Nestor Ramp, MD  meloxicam (MOBIC) 7.5 MG tablet Take 1 tablet (7.5 mg total) by mouth daily. 11/04/19   Hall-Potvin, [place], [name]  omeprazole (PRILOSEC) 20 MG capsule Take 1 capsule (20 mg  total) by mouth daily. 11/04/19   Hall-Potvin, [place], [name]  ondansetron (ZOFRAN) 4 MG tablet Take 1 tablet (4 mg total) by mouth every 6 (six) hours. 11/04/19   Hall-Potvin, [place], [name]  potassium chloride 20 MEQ TBCR Take 10 mEq by mouth daily. 11/20/18   Little, Ambrose [place], MD    Family History Family History  Problem Relation Age of Onset  . Healthy Mother   . Seizures Father     Social History Social History   Tobacco Use  . Smoking status: Never Smoker  . Smokeless tobacco: Never Used  Substance Use Topics  . Alcohol use: No  . Drug use: No     Allergies   Codeine, Latex, and Tdap [tetanus-diphth-acell pertussis]   Review of Systems As per HPI   Physical Exam Triage Vital Signs ED Triage Vitals  Enc Vitals Group     BP 11/04/19 1652 113/77     Pulse Rate 11/04/19 1652 91     Resp 11/04/19 1652 16     Temp 11/04/19 1652 98 F (36.7 C)     Temp Source 11/04/19 1652 Oral     SpO2 11/04/19 1652 91 %     Weight --      Height --      Head Circumference --      Peak Flow --      Pain Score 11/04/19 1653  8     Pain Loc --      Pain Edu? --      Excl. in [place]? --    No data found.  Updated Vital Signs BP 113/77 (BP Location: Left Arm)   Pulse 91   Temp 98 F (36.7 C) (Oral)   Resp 16   SpO2 91%   Visual Acuity Right Eye Distance:   Left Eye Distance:   Bilateral Distance:    Right Eye Near:   Left Eye Near:    Bilateral Near:     Physical Exam Constitutional:      General: She is not in acute distress.    Appearance: She is normal weight. She is not ill-appearing or diaphoretic.  HENT:     Head: Normocephalic and atraumatic.     Right Ear: Tympanic membrane, ear canal and external ear normal.     Left Ear: Tympanic membrane, ear canal and external ear normal.     Mouth/Throat:     Mouth: Mucous membranes are moist.     Pharynx: Oropharynx is clear. No oropharyngeal exudate or posterior oropharyngeal erythema.  Eyes:     General: No  scleral icterus.    Conjunctiva/sclera: Conjunctivae normal.     Pupils: Pupils are equal, round, and reactive to light.  Cardiovascular:     Rate and Rhythm: Normal rate and regular rhythm.     Heart sounds: No murmur. No gallop.   Pulmonary:     Effort: Pulmonary effort is normal. No respiratory distress.     Breath sounds: No wheezing.  Abdominal:     General: Abdomen is flat. Bowel sounds are normal.     Tenderness: There is no guarding.     Comments: Generalized mild TTP palpation.  No rebound, hernia, mass, hepatosplenomegaly.  Negative Murphy's, McBurney's, Rovsing signs  Musculoskeletal:     Cervical back: Normal and neck supple. No tenderness.     Thoracic back: Normal.     Lumbar back: Tenderness present. No swelling, spasms or bony tenderness. No scoliosis.     Right lower leg: No edema.     Left lower leg: No edema.     Comments: Diffuse low back tenderness (R>L) that spares PSIS bilaterally, spinous process.  No crepitus, fluctuance or masses appreciated.  No overlying rash.  Lymphadenopathy:     Cervical: No cervical adenopathy.  Skin:    Capillary Refill: Capillary refill takes less than 2 seconds.     Coloration: Skin is not jaundiced or pale.     Findings: No rash.  Neurological:     General: No focal deficit present.     Mental Status: She is alert and oriented to person, place, and time.      UC Treatments / Results  Labs (all labs ordered are listed, but only abnormal results are displayed) Labs Reviewed  POCT URINALYSIS DIP (MANUAL ENTRY) - Abnormal; Notable for the following components:      Result Value   Spec Grav, UA <=1.005 (*)    Blood, UA trace-intact (*)    All other components within normal limits    EKG   Radiology No results found.  Procedures Procedures (including critical care time)  Medications Ordered in UC Medications  ondansetron (ZOFRAN-ODT) disintegrating tablet 4 mg (4 mg Oral Given 11/04/19 1740)    Initial Impression /  Assessment and Plan / UC Course  I have reviewed the triage vital signs and the nursing notes.  Pertinent labs & imaging results that  were available during my care of the patient were reviewed by me and considered in my medical decision making (see chart for details).     Patient afebrile, nontoxic in office today.  Urine dipstick significant for trace intact blood, low specific gravity.  Culture deferred.  Patient given Zofran ODT which he tolerated well.  Reporting improvement of nausea at time.  Will trial PPI for suspected GERD component, Zofran short-term for additional nausea.  Urine pregnancy deferred per patient due to implant and lack of sex activity for the last few months.  Low back pain likely musculoskeletal in origin: We will try meloxicam.  Patient to keep log of symptoms and follow-up with PCP within 1 week for repeat evaluation if needed.  Return precautions discussed, patient verbalized understanding and is agreeable to plan. Final Clinical Impressions(s) / UC Diagnoses   Final diagnoses:  Nausea without vomiting  Acute bilateral low back pain without sciatica     Discharge Instructions     Take pain medication once daily: May take Tylenol as well if needed. May use ice/heat for additional pain relief. Recommend taking Prilosec every day as this can help treat nausea. Take Zofran as needed for severe nausea. Return for worsening symptoms, fever, difficulty breathing, or chest pain.    ED Prescriptions    Medication Sig Dispense Auth. Provider   omeprazole (PRILOSEC) 20 MG capsule Take 1 capsule (20 mg total) by mouth daily. 30 capsule Hall-Potvin, [place], [name]   meloxicam (MOBIC) 7.5 MG tablet Take 1 tablet (7.5 mg total) by mouth daily. 14 tablet Hall-Potvin, [place], [name]   ondansetron (ZOFRAN) 4 MG tablet Take 1 tablet (4 mg total) by mouth every 6 (six) hours. 12 tablet Hall-Potvin, [place], [name]     PDMP not reviewed this encounter.   Hall-Potvin,  [place], [place] 11/05/19 1759

## 2019-11-04 NOTE — ED Triage Notes (Signed)
Pt c/o generalized abdominal pain radiating through to back since yesterday. Pt c/o sore throat and cough since yesterday. Pt states off work today but needs a note to stay out of work tomorrow. Denies n/v/d. No distress noted

## 2019-12-14 DIAGNOSIS — G4489 Other headache syndrome: Secondary | ICD-10-CM | POA: Diagnosis not present

## 2019-12-14 DIAGNOSIS — R000 Tachycardia, unspecified: Secondary | ICD-10-CM | POA: Diagnosis not present

## 2019-12-14 DIAGNOSIS — R569 Unspecified convulsions: Secondary | ICD-10-CM | POA: Diagnosis not present

## 2019-12-15 ENCOUNTER — Encounter (HOSPITAL_COMMUNITY): Payer: Self-pay

## 2019-12-15 ENCOUNTER — Emergency Department (HOSPITAL_COMMUNITY)
Admission: EM | Admit: 2019-12-15 | Discharge: 2019-12-15 | Disposition: A | Discharge status: Home / Self Care | Payer: Medicaid Other | Attending: Emergency Medicine | Admitting: Emergency Medicine

## 2019-12-15 DIAGNOSIS — K529 Noninfective gastroenteritis and colitis, unspecified: Secondary | ICD-10-CM | POA: Diagnosis not present

## 2019-12-15 DIAGNOSIS — G40909 Epilepsy, unspecified, not intractable, without status epilepticus: Secondary | ICD-10-CM | POA: Diagnosis not present

## 2019-12-15 DIAGNOSIS — R569 Unspecified convulsions: Secondary | ICD-10-CM

## 2019-12-15 DIAGNOSIS — Z79899 Other long term (current) drug therapy: Secondary | ICD-10-CM | POA: Diagnosis not present

## 2019-12-15 DIAGNOSIS — Z91040 Latex allergy status: Secondary | ICD-10-CM | POA: Insufficient documentation

## 2019-12-15 LAB — I-STAT BETA HCG BLOOD, ED (MC, WL, AP ONLY): I-stat hCG, quantitative: <5 m[IU]/mL (ref <5)

## 2019-12-15 LAB — I-STAT CHEM 8, ED
BUN: 13 mg/dL (ref 6–20)
Calcium, Ion: 1.27 mmol/L (ref 1.15–1.40)
Chloride: 105 mmol/L (ref 98–111)
Creatinine, Ser: 0.8 mg/dL (ref 0.44–1.00)
Glucose, Bld: 83 mg/dL (ref 70–99)
HCT: 41 % (ref 36.0–46.0)
Hemoglobin: 13.9 g/dL (ref 12.0–15.0)
Potassium: 4.2 mmol/L (ref 3.5–5.1)
Sodium: 144 mmol/L (ref 135–145)
TCO2: 27 mmol/L (ref 22–32)

## 2019-12-15 LAB — VALPROIC ACID LEVEL: Valproic Acid Lvl: 75 ug/mL (ref 50.0–100.0)

## 2019-12-15 MED ORDER — DIAZEPAM 20 MG RE GEL: 20.0000 mg | Freq: Once | RECTAL | 0 refills | Status: DC | PRN

## 2019-12-15 MED ORDER — ONDANSETRON HCL 4 MG/2ML IJ SOLN
4.0000 mg | Freq: Once | INTRAMUSCULAR | Status: AC
  Administered 2019-12-15: 4 mg via INTRAVENOUS
  Filled 2019-12-15: qty 2

## 2019-12-15 MED ORDER — ONDANSETRON 4 MG PO TBDP: 4.0000 mg | ORAL_TABLET | Freq: Three times a day (TID) | ORAL | 0 refills | Status: DC | PRN

## 2019-12-15 MED ORDER — VALPROATE SODIUM 500 MG/5ML IV SOLN
250.0000 mg | Freq: Once | INTRAVENOUS | Status: AC
  Administered 2019-12-15: 250 mg via INTRAVENOUS
  Filled 2019-12-15: qty 2.5

## 2019-12-15 MED ORDER — SODIUM CHLORIDE 0.9 % IV BOLUS
1000.0000 mL | Freq: Once | INTRAVENOUS | Status: AC
  Administered 2019-12-15: 1000 mL via INTRAVENOUS

## 2019-12-15 NOTE — ED Triage Notes (Signed)
Pt. Transported via EMS from home for seizure like activity this evening. Pt was unresponsive when EMS arrived. EMS administered 5mg  Versed and pt. Instantly responded. Pt. Has a hx of seizures. Per EMS last vitals: B/P 104/86 HR 120

## 2019-12-15 NOTE — ED Notes (Addendum)
Pt's mother arrived, upset that pt was in the hall. Patient is currently alert. Pt's mother asked to speak to pt's nurse and then said, "she's been here a while, what have ya'll done, what's going to be done". Patient has been here approx 23 minutes. Nurse Hinda Kehr, relayed to pt's mother that we are awaiting the provider to proceed. Pt's mother then states, "we will just go to [employer] Provider is now at bedside. Will continue to monitor.

## 2019-12-15 NOTE — ED Provider Notes (Signed)
[facility] EMERGENCY DEPARTMENT Provider Note   CSN: [id number] Arrival date & time: 12/15/19  0011     History Chief Complaint  Patient presents with  . Seizures    Analyssa Gutierrez is a 19 y.o. female.   19 y/o female with hx of seizure disorder presents to the ED for seizure activity. This was witnessed by mother. States she had "15 seizures back-to-back" all of which were 2 minutes or less, though mother reports that patient never returned to baseline in between episodes. Mother describes the seizures as full body shaking/convulsions.  Seizures stopped after patient given Versed by EMS. Patient recalls being in the bathroom vomiting prior to seizures tonight. Mother states that everyone in the house has been sick with vomiting and/or diarrhea after eating out at a restaurant yesterday. Patient notes compliance with her Depakote (250mg  BID). No associated fevers, tongue biting, incontinence, diarrhea, abdominal pain, SOB. Seizure medications managed by the patient's PCP. Was seen by Dr. when she was a child.  The history is provided by the patient. No language interpreter was used.  Seizures     Past Medical History:  Diagnosis Date  . Seizure (HCC)   . Seizures [facility])     Patient Active Problem List   Diagnosis Date Noted  . Family history of brain cancer 05/20/2015  . 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 Well child check 04/26/2011  . DEVELOPMENTAL DELAY 08/02/2010  . Seizures (HCC) 10/31/2006    Past Surgical History:  Procedure Laterality Date  . LEG SURGERY       OB History   No obstetric history on file.     Family History  Problem Relation Age of Onset  . Healthy Mother   . Seizures Father     Social History   Tobacco Use  . Smoking status: Never Smoker  . Smokeless tobacco: Never Used  Substance Use Topics  . Alcohol use: No  . Drug use: No    Home Medications Prior to Admission medications   Medication Sig Start Date  End Date Taking? Authorizing Provider  diazepam (DIASAT) 20 MG GEL Place 20 mg rectally once as needed for up to 1 dose. Use in the setting of seizure lasting longer than 5 minutes. Call 9-1-1 promptly after use. 12/15/19   12/17/19, [name]  divalproex (DEPAKOTE SPRINKLE) 125 MG capsule Take 3 capsules (375 mg total) by mouth 2 (two) times daily. 12/09/18   02/08/19, MD  divalproex (DEPAKOTE SPRINKLE) 125 MG capsule TAKE 2 CAPSULES (250 MG TOTAL) BY MOUTH 2 (TWO) TIMES DAILY. 07/07/19   13/3/20, MD  hydrocortisone 2.5 % lotion Apply topically 2 (two) times daily. 04/04/18   Wurst, 06/04/18, [name]  loratadine (CLARITIN) 10 MG tablet Take 1 tablet (10 mg total) by mouth daily. 10/15/18   12/14/18, MD  meloxicam (MOBIC) 7.5 MG tablet Take 1 tablet (7.5 mg total) by mouth daily. 11/04/19   Hall-Potvin, 01/04/20, [name]  omeprazole (PRILOSEC) 20 MG capsule Take 1 capsule (20 mg total) by mouth daily. 11/04/19   Hall-Potvin, 01/04/20, [name]  ondansetron (ZOFRAN ODT) 4 MG disintegrating tablet Take 1 tablet (4 mg total) by mouth every 8 (eight) hours as needed for nausea or vomiting. 12/15/19   12/17/19, [name]  ondansetron (ZOFRAN) 4 MG tablet Take 1 tablet (4 mg total) by mouth every 6 (six) hours. 11/04/19   Hall-Potvin, 01/04/20, [name]  potassium chloride 20 MEQ TBCR Take 10 mEq by  mouth daily. 11/20/18   Little, Wenda Overland, MD    Allergies    Codeine, Latex, and Tdap [tetanus-diphth-acell pertussis]  Review of Systems   Review of Systems  Neurological: Positive for seizures.  Ten systems reviewed and are negative for acute change, except as noted in the HPI.    Physical Exam Updated Vital Signs BP 104/61 (BP Location: Right Arm)   Pulse 88   Temp 98 F (36.7 C) (Oral)   Resp 16   SpO2 98%   Physical Exam Vitals and nursing note reviewed.  Constitutional:      General: She is not in acute distress.    Appearance: She is well-developed. She is not diaphoretic.     Comments: Nontoxic  appearing and in NAD  HENT:     Head: Normocephalic and atraumatic.     Mouth/Throat:     Mouth: Mucous membranes are moist.     Comments: Symmetric rise of the uvula with phonation Eyes:     General: No scleral icterus.    Extraocular Movements: Extraocular movements intact.     Conjunctiva/sclera: Conjunctivae normal.     Pupils: Pupils are equal, round, and reactive to light.  Neck:     Comments: No nuchal rigidity or meningismus  Cardiovascular:     Rate and Rhythm: Normal rate and regular rhythm.     Pulses: Normal pulses.  Pulmonary:     Effort: Pulmonary effort is normal. No respiratory distress.     Comments: Respirations even and unlabored Musculoskeletal:        General: Normal range of motion.     Cervical back: Normal range of motion.  Skin:    General: Skin is warm and dry.     Coloration: Skin is not pale.     Findings: No erythema or rash.  Neurological:     General: No focal deficit present.     Mental Status: She is alert and oriented to person, place, and time.     Coordination: Coordination normal.     Comments: GCS 15. Speech is goal oriented. No cranial nerve deficits appreciated; symmetric eyebrow raise, no facial drooping, tongue midline. Patient has equal grip strength bilaterally with 5/5 strength against resistance in all major muscle groups bilaterally. Sensation to light touch intact. Patient moves extremities without ataxia.   Psychiatric:        Behavior: Behavior normal.     ED Results / Procedures / Treatments   Labs (all labs ordered are listed, but only abnormal results are displayed) Labs Reviewed  VALPROIC ACID LEVEL  I-STAT BETA HCG BLOOD, ED ([facility], [facility], [facility] ONLY)  I-STAT CHEM 8, ED    EKG None  Radiology No results found.  Procedures Procedures (including critical care time)  Medications Ordered in ED Medications  valproate (DEPACON) 250 mg in dextrose 5 % 50 mL IVPB (250 mg Intravenous New Bag/Given 12/15/19 0256)  sodium  chloride 0.9 % bolus 1,000 mL (0 mLs Intravenous Stopped 12/15/19 0209)  ondansetron (ZOFRAN) injection 4 mg (4 mg Intravenous Given 12/15/19 0132)    ED Course  I have reviewed the triage vital signs and the nursing notes.  Pertinent labs & imaging results that were available during my care of the patient were reviewed by me and considered in my medical decision making (see chart for details).  Clinical Course as of Dec 14 312  Tue Dec 15, 2019  0206 Spoke with Dr. Cheral Marker who recommends loading dose of IV Depakote at 5 mg/kg  and to keep patient on current daily dose of Depakote until she is able to follow up with outpatient neurology and her PCP.   [KH]    Clinical Course User Index [KH] [name], [name]   MDM Rules/Calculators/A&P                      19 year old female with a history of seizure disorder presents to the ED for evaluation after having witnessed seizure-like activity at home.  Seizure occurred in the setting of illness; patient has been sick with gastroenteritis symptoms as have other individuals in the family.  Was vomiting prior to onset of her seizure.  Given Versed by EMS and was sleepy, but at baseline on arrival to the ED.  Neurologic exam nonfocal.  No recent fevers.  No nuchal rigidity or meningismus.  The patient is now tolerating oral fluids following Zofran.  She has received IV fluids.  No electrolyte derangements.  Pregnancy is negative.  She reports compliance with her Depakote and her valproic acid level appears therapeutic.  The patient was given IV Depakote prior to discharge at the recommendation of neurology.  She is not actively followed by an outpatient neurologist.  Will provide referral.  Encouraged to follow-up with her primary care doctor in the interim.  Return precautions discussed and provided. Patient discharged in stable condition with no unaddressed concerns.   Final Clinical Impression(s) / ED Diagnoses Final diagnoses:  Seizure (HCC)   Gastroenteritis    Rx / DC Orders ED Discharge Orders         Ordered    Ambulatory referral to Neurology    Comments: An appointment is requested in approximately: 2-4 weeks Seizure d/o with no follow up since evaluated by pediatric Neuro as a child.   12/15/19 0235    diazepam (DIASAT) 20 MG GEL  Once PRN     12/15/19 0235    ondansetron (ZOFRAN ODT) 4 MG disintegrating tablet  Every 8 hours PRN     12/15/19 0236           [name], [name] 12/15/19 0314    Gilda Crease, MD 12/15/19 [phone]

## 2019-12-15 NOTE — Discharge Instructions (Signed)
Continue your current Depakote dosing and follow-up with your primary care doctor.  We also recommend follow-up with a neurologist.  You were given a referral to [facility] neurology.  Use Zofran for management of persistent nausea and vomiting.  Drink plenty of clear liquids to prevent dehydration.  Avoid fried foods, fatty foods, greasy foods, and milk products until nausea/vomiting resolve. You may return for new or concerning symptoms.

## 2019-12-15 NOTE — ED Notes (Signed)
Fluids administering, pt. non compliant, keeps bending arm, causing fluids to infuse at a slower rate. Pt. Educated on the importance of keeping her arm straight.

## 2019-12-26 ENCOUNTER — Other Ambulatory Visit: Payer: Self-pay | Admitting: Family Medicine

## 2020-01-07 ENCOUNTER — Other Ambulatory Visit: Payer: Self-pay | Admitting: Family Medicine

## 2020-01-07 NOTE — Telephone Encounter (Signed)
Patients grandmother is calling nurse stating she had made "several" attempts to contact us to get Depakote refilled. I advised her will filled this on 12/28/2019. Grandmother stated, "they have nothing." I called the pharmacy and they never received this, despite confirmation on our end. Verbal given to fill 12/28/2019 Depakote.

## 2020-01-26 ENCOUNTER — Telehealth: Payer: Self-pay | Admitting: Neurology

## 2020-01-26 ENCOUNTER — Encounter: Payer: Self-pay | Admitting: Neurology

## 2020-01-26 ENCOUNTER — Ambulatory Visit: Payer: Medicaid Other | Admitting: Neurology

## 2020-01-26 NOTE — Telephone Encounter (Signed)
Pt was a no show to the new patient apt.

## 2020-01-29 ENCOUNTER — Other Ambulatory Visit: Payer: Self-pay | Admitting: Family Medicine

## 2020-02-21 ENCOUNTER — Other Ambulatory Visit: Payer: Self-pay | Admitting: Family Medicine

## 2020-03-09 ENCOUNTER — Encounter: Payer: Medicaid Other | Admitting: Family Medicine

## 2020-03-16 ENCOUNTER — Other Ambulatory Visit: Payer: Self-pay | Admitting: Family Medicine

## 2020-05-11 ENCOUNTER — Other Ambulatory Visit: Payer: Self-pay | Admitting: Family Medicine

## 2020-05-18 ENCOUNTER — Encounter: Payer: Medicaid Other | Admitting: Family Medicine

## 2020-05-28 ENCOUNTER — Emergency Department (HOSPITAL_COMMUNITY): Payer: Medicaid Other

## 2020-05-28 ENCOUNTER — Emergency Department (HOSPITAL_COMMUNITY)
Admission: EM | Admit: 2020-05-28 | Discharge: 2020-05-28 | Disposition: A | Discharge status: Home / Self Care | Payer: Medicaid Other | Attending: Emergency Medicine | Admitting: Emergency Medicine

## 2020-05-28 ENCOUNTER — Other Ambulatory Visit: Payer: Self-pay

## 2020-05-28 ENCOUNTER — Encounter (HOSPITAL_COMMUNITY): Payer: Self-pay | Admitting: Emergency Medicine

## 2020-05-28 DIAGNOSIS — R05 Cough: Secondary | ICD-10-CM | POA: Diagnosis not present

## 2020-05-28 DIAGNOSIS — R439 Unspecified disturbances of smell and taste: Secondary | ICD-10-CM | POA: Insufficient documentation

## 2020-05-28 DIAGNOSIS — J069 Acute upper respiratory infection, unspecified: Secondary | ICD-10-CM

## 2020-05-28 DIAGNOSIS — B9789 Other viral agents as the cause of diseases classified elsewhere: Secondary | ICD-10-CM | POA: Diagnosis not present

## 2020-05-28 DIAGNOSIS — Z20822 Contact with and (suspected) exposure to covid-19: Secondary | ICD-10-CM | POA: Insufficient documentation

## 2020-05-28 DIAGNOSIS — Z91040 Latex allergy status: Secondary | ICD-10-CM | POA: Diagnosis not present

## 2020-05-28 DIAGNOSIS — J029 Acute pharyngitis, unspecified: Secondary | ICD-10-CM | POA: Insufficient documentation

## 2020-05-28 LAB — GROUP A STREP BY PCR: Group A Strep by PCR: NOT DETECTED

## 2020-05-28 LAB — RESPIRATORY PANEL BY RT PCR (FLU A&B, COVID)
Influenza A by PCR: NEGATIVE
Influenza B by PCR: NEGATIVE
SARS Coronavirus 2 by RT PCR: NEGATIVE

## 2020-05-28 MED ORDER — LIDOCAINE VISCOUS HCL 2 % MT SOLN
15.0000 mL | Freq: Once | OROMUCOSAL | Status: AC
  Administered 2020-05-28: 15 mL via OROMUCOSAL
  Filled 2020-05-28: qty 15

## 2020-05-28 NOTE — ED Notes (Signed)
All assessments WDL

## 2020-05-28 NOTE — Discharge Instructions (Addendum)
Covid test and strep test were negative. Xray did not show signs of pneumonia.   You likely have a viral illness and should take tylenol and ibuprofen for pain. You can drink warm tea, use cough drops, warm salt water rinses to help with your sore throat.

## 2020-05-28 NOTE — ED Triage Notes (Signed)
Patient reports body aches, cough, loss of taste and smell.

## 2020-05-28 NOTE — ED Provider Notes (Signed)
[facility]-EMERGENCY DEPT Provider Note   CSN: [id number] Arrival date & time: 05/28/20  1749     History Chief Complaint  Patient presents with  . Generalized Body Aches  . Cough    [name] is a 19 y.o. female with past medical history significant for seizures.  Did not have Covid vaccinations  HPI Patient presents to emergency department today with chief complaint of cough and generalized body aches x2 days.  Patient states her symptoms started after she had been outside in the rain for several hours when she was looking for her friend.  She is endorsing subjective fever, nonproductive cough and loss of sense of taste and smell, and sore throat.  She describes her sore throat as feeling a raw sensation when trying to swallow.  She is able to tolerate p.o. intake however states that it hurts.  She rates her pain 4 out of 10 in severity.  No medications for symptoms prior to arrival.  No sick contacts, no known Covid exposure.  She denies chills, congestion, neck pain, shortness of breath, chest pain, back pain, urinary symptoms, diarrhea.    Past Medical History:  Diagnosis Date  . Seizure (HCC)   . Seizures [facility])     Patient Active Problem List   Diagnosis Date Noted  . Family history of brain cancer 05/20/2015  . 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 Well child check 04/26/2011  . DEVELOPMENTAL DELAY 08/02/2010  . Seizures (HCC) 10/31/2006    Past Surgical History:  Procedure Laterality Date  . LEG SURGERY       OB History   No obstetric history on file.     Family History  Problem Relation Age of Onset  . Healthy Mother   . Seizures Father     Social History   Tobacco Use  . Smoking status: Never Smoker  . Smokeless tobacco: Never Used  Substance Use Topics  . Alcohol use: No  . Drug use: No    Home Medications Prior to Admission medications   Medication Sig Start Date End Date Taking? Authorizing Provider  diazepam (DIASAT)  20 MG GEL Place 20 mg rectally once as needed for up to 1 dose. Use in the setting of seizure lasting longer than 5 minutes. Call 9-1-1 promptly after use. 12/15/19   [name], [name]  divalproex (DEPAKOTE SPRINKLE) 125 MG capsule Take 3 capsules (375 mg total) by mouth 2 (two) times daily. 12/09/18   Nestor Ramp, MD  divalproex (DEPAKOTE SPRINKLE) 125 MG capsule TAKE 2 CAPSULES (250 MG TOTAL) BY MOUTH 2 (TWO) TIMES DAILY. 12/28/19   Nestor Ramp, MD  divalproex (DEPAKOTE SPRINKLE) 125 MG capsule TAKE 2 CAPSULES BY MOUTH TWICE A DAY 05/11/20   Nestor Ramp, MD  hydrocortisone 2.5 % lotion Apply topically 2 (two) times daily. 04/04/18   Wurst, [place], [name]  loratadine (CLARITIN) 10 MG tablet Take 1 tablet (10 mg total) by mouth daily. 10/15/18   Nestor Ramp, MD  meloxicam (MOBIC) 7.5 MG tablet Take 1 tablet (7.5 mg total) by mouth daily. 11/04/19   Hall-Potvin, [place], [name]  omeprazole (PRILOSEC) 20 MG capsule Take 1 capsule (20 mg total) by mouth daily. 11/04/19   Hall-Potvin, [place], [name]  ondansetron (ZOFRAN ODT) 4 MG disintegrating tablet Take 1 tablet (4 mg total) by mouth every 8 (eight) hours as needed for nausea or vomiting. 12/15/19   [name], [name]  ondansetron (ZOFRAN) 4 MG tablet Take 1 tablet (  4 mg total) by mouth every 6 (six) hours. 11/04/19   Hall-Potvin, [place], [name]  potassium chloride 20 MEQ TBCR Take 10 mEq by mouth daily. 11/20/18   Little, Ambrose [place], MD    Allergies    Codeine, Latex, and Tdap [tetanus-diphth-acell pertussis]  Review of Systems   Review of Systems All other systems are reviewed and are negative for acute change except as noted in the HPI.  Physical Exam Updated Vital Signs BP 135/86 (BP Location: Right Arm)   Pulse 95   Temp 99.2 F (37.3 C) (Oral)   Resp 18   SpO2 98%   Physical Exam Vitals and nursing note reviewed.  Constitutional:      Appearance: She is well-developed. She is not ill-appearing or toxic-appearing.  HENT:     Head:  Normocephalic and atraumatic.     Comments: No sinus or temporal tenderness.    Nose: Nose normal.     Mouth/Throat:     Comments: Minor erythema to oropharynx, no edema, no exudate, no tonsillar swelling, voice normal, neck supple without lymphadenopathy Eyes:     General: No scleral icterus.       Right eye: No discharge.        Left eye: No discharge.     Conjunctiva/sclera: Conjunctivae normal.  Neck:     Vascular: No JVD.  Cardiovascular:     Rate and Rhythm: Normal rate and regular rhythm.     Pulses: Normal pulses.     Heart sounds: Normal heart sounds.  Pulmonary:     Effort: Pulmonary effort is normal. No respiratory distress.     Breath sounds: Normal breath sounds. No stridor. No wheezing, rhonchi or rales.     Comments: Oxygen saturation is 98% on room air.  Chest:     Chest wall: No tenderness.  Abdominal:     General: There is no distension.  Musculoskeletal:        General: Normal range of motion.     Cervical back: Normal range of motion.  Lymphadenopathy:     Cervical: No cervical adenopathy.  Skin:    General: Skin is warm and dry.     Findings: No rash.  Neurological:     Mental Status: She is oriented to person, place, and time.     GCS: GCS eye subscore is 4. GCS verbal subscore is 5. GCS motor subscore is 6.     Comments: Fluent speech, no facial droop.  Psychiatric:        Behavior: Behavior normal.     ED Results / Procedures / Treatments   Labs (all labs ordered are listed, but only abnormal results are displayed) Labs Reviewed  GROUP A STREP BY PCR  RESPIRATORY PANEL BY RT PCR (FLU A&B, COVID)    EKG None  Radiology DG Chest Portable 1 View  Result Date: 05/28/2020 CLINICAL DATA:  Cough. EXAM: PORTABLE CHEST 1 VIEW COMPARISON:  07/15/2015 FINDINGS: The cardiomediastinal contours are normal. The lungs are clear. Pulmonary vasculature is normal. No consolidation, pleural effusion, or pneumothorax. No acute osseous abnormalities are seen.  IMPRESSION: Negative AP view of the chest. Electronically Signed   By: Narda Rutherford M.D.   On: 05/28/2020 18:51    Procedures Procedures (including critical care time)  Medications Ordered in ED Medications  lidocaine (XYLOCAINE) 2 % viscous mouth solution 15 mL (15 mLs Mouth/Throat Given 05/28/20 1821)    ED Course  I have reviewed the triage vital signs and the nursing notes.  Pertinent labs & imaging results that were available during my care of the patient were reviewed by me and considered in my medical decision making (see chart for details).    MDM Rules/Calculators/A&P                          History provided by patient with additional history obtained from chart review.    19 yo female who presents with  sore throat. Still able to tolerate PO/secretions but with worsening pain. Patient is afebrile, non-toxic appearing, sitting comfortably on examination table. On exam, she has erythema to posterior oropharynx. Presentation not concerning for PTA or Ludwig's angina, Uvulitis, epiglottitis, peritonsillar abscess, or retropharyngeal abscess.   She denies chance of pregnancy as she is not sexually active. Refuses pregnancy test. Covid and flu test negative. I viewed pt's chest xray and it does not suggest acute infectious processes Strep reviewed. Negative. Discussed results with patient. Encouraged at home supportive care measures. Pt does not appear dehydrated, but did discuss importance of water rehydration. Patient had ample opportunity for questions and discussion. All patient's questions were answered with full understanding. Patient expresses understanding and agreement to plan. Patient successfully fluid challenged in the ED without difficulty swallowing.  Strict return precautions given. NAD. VSS. Recommended PCP follow up for re-evaluation.   Wreatha Sturgeon was evaluated in Emergency Department on 05/28/2020 for the symptoms described in the history of present illness. She  was evaluated in the context of the global COVID-19 pandemic, which necessitated consideration that the patient might be at risk for infection with the SARS-CoV-2 virus that causes COVID-19. Institutional protocols and algorithms that pertain to the evaluation of patients at risk for COVID-19 are in a state of rapid change based on information released by regulatory bodies including the CDC and federal and state organizations. These policies and algorithms were followed during the patient's care in the ED.   Portions of this note were generated with Scientist, clinical (histocompatibility and immunogenetics). Dictation errors may occur despite best attempts at proofreading.   Final Clinical Impression(s) / ED Diagnoses Final diagnoses:  Viral upper respiratory tract infection    Rx / DC Orders ED Discharge Orders    None       Kathyrn Lass 05/28/20 2023    Bethann Berkshire, MD 05/29/20 [phone]

## 2020-05-28 NOTE — ED Notes (Signed)
Rounds performed. Pt asleep and resting comfortably. No concerns or needs identified

## 2020-05-30 ENCOUNTER — Telehealth: Payer: Self-pay

## 2020-05-30 NOTE — Telephone Encounter (Signed)
Call made to emergency contact on file for patient due to number listed "[phone]"  Transition Care Management Follow-up Telephone Call  Date of discharge and from where: 05/28/2020 from [facility]  How have you been since you were released from the hospital? Grandmother stated that patient was feeling fine and was unable to speak because she was resting.   Any questions or concerns? No  Items Reviewed:  Did the pt receive and understand the discharge instructions provided? Yes   Medications obtained and verified? Yes   Any new allergies since your discharge? Yes   Dietary orders reviewed? Yes  Do you have support at home? Yes   Functional Questionnaire: (I = Independent and D = Dependent) ADLs: I Bathing/Dressing- I Meal Prep- I Eating- I Maintaining continence- I Transferring/Ambulation- I Managing Meds- I  Follow up appointments reviewed:   PCP Hospital f/u appt confirmed? Patient grandmother hung up phone before questions could be [facility] f/u appt confirmed?   Are transportation arrangements needed? No   If their condition worsens, is the pt aware to call PCP or go to the Emergency Dept.?   Was the patient provided with contact information for the PCP's office or ED?   Was to pt encouraged to call back with questions or concerns?

## 2020-05-31 ENCOUNTER — Telehealth: Payer: Self-pay | Admitting: *Deleted

## 2020-05-31 NOTE — Telephone Encounter (Deleted)
Email sent to  Internal Medicine to request follow up appointment .                                Tynika Luddy                                  PEC                              [phone]         

## 2020-06-02 NOTE — Telephone Encounter (Signed)
Email sent to [facility]  to schedule follow up appointment on 06/02/20.Email was sent to Internal Medicine in error on 06/01/2020 Avie Arenas PEC  [phone]

## 2020-08-04 DIAGNOSIS — F339 Major depressive disorder, recurrent, unspecified: Secondary | ICD-10-CM | POA: Diagnosis not present

## 2020-08-17 ENCOUNTER — Ambulatory Visit
Admission: EM | Admit: 2020-08-17 | Discharge: 2020-08-17 | Disposition: A | Discharge status: Home / Self Care | Payer: Medicaid Other | Attending: Emergency Medicine | Admitting: Emergency Medicine

## 2020-08-17 DIAGNOSIS — Z20822 Contact with and (suspected) exposure to covid-19: Secondary | ICD-10-CM | POA: Diagnosis not present

## 2020-08-17 DIAGNOSIS — Z3202 Encounter for pregnancy test, result negative: Secondary | ICD-10-CM | POA: Diagnosis not present

## 2020-08-17 DIAGNOSIS — J069 Acute upper respiratory infection, unspecified: Secondary | ICD-10-CM | POA: Diagnosis not present

## 2020-08-17 LAB — POCT RAPID STREP A (OFFICE): Rapid Strep A Screen: NEGATIVE

## 2020-08-17 LAB — POCT URINE PREGNANCY: Preg Test, Ur: NEGATIVE

## 2020-08-17 MED ORDER — IBUPROFEN 600 MG PO TABS: 600.0000 mg | ORAL_TABLET | Freq: Four times a day (QID) | ORAL | 0 refills | Status: DC | PRN

## 2020-08-17 MED ORDER — FLUTICASONE PROPIONATE 50 MCG/ACT NA SUSP: 1.0000 | Freq: Every day | NASAL | 0 refills | Status: DC

## 2020-08-17 MED ORDER — CYCLOBENZAPRINE HCL 5 MG PO TABS: 5.0000 mg | ORAL_TABLET | Freq: Two times a day (BID) | ORAL | 0 refills | Status: DC | PRN

## 2020-08-17 MED ORDER — BENZONATATE 200 MG PO CAPS: 200.0000 mg | ORAL_CAPSULE | Freq: Three times a day (TID) | ORAL | 0 refills | Status: AC | PRN

## 2020-08-17 MED ORDER — ONDANSETRON 4 MG PO TBDP: 4.0000 mg | ORAL_TABLET | Freq: Three times a day (TID) | ORAL | 0 refills | Status: DC | PRN

## 2020-08-17 NOTE — Discharge Instructions (Addendum)
Strep test negative, Covid test pending Ibuprofen and Tylenol for fevers, body aches, headaches and chest discomfort Flonase for nasal congestion Tessalon for cough Zofran as needed for nausea May use Flexeril as needed if developing muscle spasms Rest and fluids  Pregnancy test was negative-repeat pregnancy test if not developing normal menstrual cycle over the next few weeks.  Please follow-up if any symptoms not improving or worsening

## 2020-08-17 NOTE — ED Triage Notes (Signed)
Pt c/o productive cough with yellow sputum, nasal/chest congestion, sore throat, and body aches. Pt requesting preg test, last [facility] 12/3-5.

## 2020-08-17 NOTE — ED Provider Notes (Signed)
[name]    CSN: [id number] Arrival date & time: 08/17/20  1550      History   Chief Complaint Chief Complaint  Patient presents with  . Cough    HPI [name] is a 19 y.o. female presenting today for evaluation of URI symptoms and pregnancy test.  Reports that over the past 1 to 2 days she has developed cough congestion sore throat body aches as well as some chest pressure.  She denies any known fevers.  Denies any close sick contacts.  Also reports requesting a pregnancy test.  Last menstrual cycle was 12 3/5.  Has Nexplanon in place, but this is expired.  Denies any specific symptoms concerning for pregnancy.  HPI  Past Medical History:  Diagnosis Date  . Seizure (HCC)   . Seizures [facility])     Patient Active Problem List   Diagnosis Date Noted  . Family history of brain cancer 05/20/2015  . 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 Well child check 04/26/2011  . DEVELOPMENTAL DELAY 08/02/2010  . Seizures (HCC) 10/31/2006    Past Surgical History:  Procedure Laterality Date  . LEG SURGERY      OB History   No obstetric history on file.      Home Medications    Prior to Admission medications   Medication Sig Start Date End Date Taking? Authorizing Provider  benzonatate (TESSALON) 200 MG capsule Take 1 capsule (200 mg total) by mouth 3 (three) times daily as needed for up to 7 days for cough. 08/17/20 08/24/20  Tyla Burgner C, [name]  cyclobenzaprine (FLEXERIL) 5 MG tablet Take 1-2 tablets (5-10 mg total) by mouth 2 (two) times daily as needed for muscle spasms. 08/17/20   Kenzlee Fishburn C, [name]  diazepam (DIASAT) 20 MG GEL Place 20 mg rectally once as needed for up to 1 dose. Use in the setting of seizure lasting longer than 5 minutes. Call 9-1-1 promptly after use. 12/15/19   [name], [name]  divalproex (DEPAKOTE SPRINKLE) 125 MG capsule Take 3 capsules (375 mg total) by mouth 2 (two) times daily. 12/09/18   Nestor Ramp, MD  divalproex  (DEPAKOTE SPRINKLE) 125 MG capsule TAKE 2 CAPSULES (250 MG TOTAL) BY MOUTH 2 (TWO) TIMES DAILY. 12/28/19   Nestor Ramp, MD  divalproex (DEPAKOTE SPRINKLE) 125 MG capsule TAKE 2 CAPSULES BY MOUTH TWICE A DAY 05/11/20   Nestor Ramp, MD  fluticasone [facility]) 50 MCG/ACT nasal spray Place 1-2 sprays into both nostrils daily. 08/17/20   Lavarr President C, [name]  hydrocortisone 2.5 % lotion Apply topically 2 (two) times daily. 04/04/18   Wurst, [place], [name]  ibuprofen (ADVIL) 600 MG tablet Take 1 tablet (600 mg total) by mouth every 6 (six) hours as needed. 08/17/20   Darrelle Wiberg C, [name]  loratadine (CLARITIN) 10 MG tablet Take 1 tablet (10 mg total) by mouth daily. 10/15/18   Nestor Ramp, MD  meloxicam (MOBIC) 7.5 MG tablet Take 1 tablet (7.5 mg total) by mouth daily. 11/04/19   Hall-Potvin, [place], [name]  omeprazole (PRILOSEC) 20 MG capsule Take 1 capsule (20 mg total) by mouth daily. 11/04/19   Hall-Potvin, [place], [name]  ondansetron (ZOFRAN ODT) 4 MG disintegrating tablet Take 1 tablet (4 mg total) by mouth every 8 (eight) hours as needed for nausea or vomiting. 08/17/20   Chaelyn Bunyan C, [name]  ondansetron (ZOFRAN) 4 MG tablet Take 1 tablet (4 mg total) by mouth every 6 (six) hours. 11/04/19  Hall-Potvin, [place], [name]  potassium chloride 20 MEQ TBCR Take 10 mEq by mouth daily. 11/20/18   Little, Ambrose [place], MD    Family History Family History  Problem Relation Age of Onset  . Healthy Mother   . Seizures Father     Social History Social History   Tobacco Use  . Smoking status: Never Smoker  . Smokeless tobacco: Never Used  Substance Use Topics  . Alcohol use: No  . Drug use: No     Allergies   Codeine, Latex, and Tdap [tetanus-diphth-acell pertussis]   Review of Systems Review of Systems  Constitutional: Negative for activity change, appetite change, chills, fatigue and fever.  HENT: Positive for congestion, rhinorrhea, sinus pressure and sore throat. Negative for ear pain  and trouble swallowing.   Eyes: Negative for discharge and redness.  Respiratory: Positive for cough. Negative for chest tightness and shortness of breath.   Cardiovascular: Negative for chest pain.  Gastrointestinal: Negative for abdominal pain, diarrhea, nausea and vomiting.  Musculoskeletal: Positive for myalgias.  Skin: Negative for rash.  Neurological: Positive for headaches. Negative for dizziness and light-headedness.     Physical Exam Triage Vital Signs ED Triage Vitals  Enc Vitals Group     BP 08/17/20 1719 (!) 112/58     Pulse Rate 08/17/20 1719 85     Resp 08/17/20 1719 16     Temp 08/17/20 1719 98.5 F (36.9 C)     Temp Source 08/17/20 1719 Oral     SpO2 08/17/20 1719 98 %     Weight --      Height --      Head Circumference --      Peak Flow --      Pain Score 08/17/20 1722 5     Pain Loc --      Pain Edu? --      Excl. in GC? --    No data found.  Updated Vital Signs BP (!) 112/58 (BP Location: Left Arm)   Pulse 85   Temp 98.5 F (36.9 C) (Oral)   Resp 16   LMP 08/05/2020   SpO2 98%   Visual Acuity Right Eye Distance:   Left Eye Distance:   Bilateral Distance:    Right Eye Near:   Left Eye Near:    Bilateral Near:     Physical Exam Vitals and nursing note reviewed.  Constitutional:      Appearance: She is well-developed and well-nourished.     Comments: No acute distress  HENT:     Head: Normocephalic and atraumatic.     Ears:     Comments: Bilateral ears without tenderness to palpation of external auricle, tragus and mastoid, EAC's without erythema or swelling, TM's with good bony landmarks and cone of light. Non erythematous.     Nose: Nose normal.     Mouth/Throat:     Comments: Oral mucosa pink and moist, no tonsillar enlargement or exudate. Posterior pharynx patent and nonerythematous, no uvula deviation or swelling. Normal phonation. Eyes:     Conjunctiva/sclera: Conjunctivae normal.  Cardiovascular:     Rate and Rhythm: Normal  rate.  Pulmonary:     Effort: Pulmonary effort is normal. No respiratory distress.     Comments: Breathing comfortably at rest, CTABL, no wheezing, rales or other adventitious sounds auscultated  Anterior chest tender to palpation on left side Abdominal:     General: There is no distension.  Musculoskeletal:        General: Normal  range of motion.     Cervical back: Neck supple.  Skin:    General: Skin is warm and dry.  Neurological:     Mental Status: She is alert and oriented to person, place, and time.  Psychiatric:        Mood and Affect: Mood and affect normal.      UC Treatments / Results  Labs (all labs ordered are listed, but only abnormal results are displayed) Labs Reviewed  NOVEL CORONAVIRUS, NAA  CULTURE, GROUP A STREP [facility])  POCT URINE PREGNANCY  POCT RAPID STREP A (OFFICE)    EKG   Radiology No results found.  Procedures Procedures (including critical care time)  Medications Ordered in UC Medications - No data to display  Initial Impression / Assessment and Plan / UC Course  I have reviewed the triage vital signs and the nursing notes.  Pertinent labs & imaging results that were available during my care of the patient were reviewed by me and considered in my medical decision making (see chart for details).     Strep test negative, Covid test pending.  Suspect likely viral URI and recommending symptomatic and supportive care.  Reproducible chest tenderness, suspect most likely chest wall inflammation recommending anti-inflammatories and monitoring of this chest discomfort.  Rest and fluids.  Pregnancy test negative, discussed continuing to monitor, may repeat pregnancy test if missing next menstrual cycle.   Final Clinical Impressions(s) / UC Diagnoses   Final diagnoses:  Encounter for screening laboratory testing for COVID-19 virus  Viral URI with cough  Negative pregnancy test     Discharge Instructions     Strep test negative, Covid  test pending Ibuprofen and Tylenol for fevers, body aches, headaches and chest discomfort Flonase for nasal congestion Tessalon for cough Zofran as needed for nausea May use Flexeril as needed if developing muscle spasms Rest and fluids  Pregnancy test was negative-repeat pregnancy test if not developing normal menstrual cycle over the next few weeks.  Please follow-up if any symptoms not improving or worsening    ED Prescriptions    Medication Sig Dispense Auth. Provider   ibuprofen (ADVIL) 600 MG tablet Take 1 tablet (600 mg total) by mouth every 6 (six) hours as needed. 30 tablet Jaxie Racanelli C, [name]   ondansetron (ZOFRAN ODT) 4 MG disintegrating tablet Take 1 tablet (4 mg total) by mouth every 8 (eight) hours as needed for nausea or vomiting. 20 tablet Elijah Phommachanh C, [name]   benzonatate (TESSALON) 200 MG capsule Take 1 capsule (200 mg total) by mouth 3 (three) times daily as needed for up to 7 days for cough. 28 capsule Ayaz Sondgeroth C, [name]   fluticasone (FLONASE) 50 MCG/ACT nasal spray Place 1-2 sprays into both nostrils daily. 16 g Talor Cheema C, [name]   cyclobenzaprine (FLEXERIL) 5 MG tablet Take 1-2 tablets (5-10 mg total) by mouth 2 (two) times daily as needed for muscle spasms. 16 tablet Naketa Daddario, [place] C, [name]     PDMP not reviewed this encounter.   Lew Dawes, [place] 08/17/20 1827

## 2020-08-18 LAB — NOVEL CORONAVIRUS, NAA: SARS-CoV-2, NAA: NOT DETECTED

## 2020-08-18 LAB — SARS-COV-2, NAA 2 DAY TAT

## 2020-08-20 LAB — CULTURE, GROUP A STREP (THRC)

## 2020-08-25 DIAGNOSIS — F339 Major depressive disorder, recurrent, unspecified: Secondary | ICD-10-CM | POA: Diagnosis not present

## 2020-09-10 ENCOUNTER — Encounter: Payer: Self-pay | Admitting: Emergency Medicine

## 2020-09-10 ENCOUNTER — Ambulatory Visit
Admission: EM | Admit: 2020-09-10 | Discharge: 2020-09-10 | Disposition: A | Discharge status: Home / Self Care | Payer: Medicaid Other | Attending: Emergency Medicine | Admitting: Emergency Medicine

## 2020-09-10 ENCOUNTER — Other Ambulatory Visit: Payer: Self-pay

## 2020-09-10 DIAGNOSIS — J069 Acute upper respiratory infection, unspecified: Secondary | ICD-10-CM | POA: Diagnosis not present

## 2020-09-10 DIAGNOSIS — Z20822 Contact with and (suspected) exposure to covid-19: Secondary | ICD-10-CM | POA: Diagnosis not present

## 2020-09-10 MED ORDER — BENZONATATE 200 MG PO CAPS: 200.0000 mg | ORAL_CAPSULE | Freq: Three times a day (TID) | ORAL | 0 refills | Status: AC | PRN

## 2020-09-10 MED ORDER — IBUPROFEN 800 MG PO TABS: 800.0000 mg | ORAL_TABLET | Freq: Three times a day (TID) | ORAL | 0 refills | Status: DC

## 2020-09-10 MED ORDER — DM-GUAIFENESIN ER 30-600 MG PO TB12: 1.0000 | ORAL_TABLET | Freq: Two times a day (BID) | ORAL | 0 refills | Status: DC

## 2020-09-10 NOTE — ED Provider Notes (Signed)
[name]    CSN: [id number] Arrival date & time: 09/10/20  6213      History   Chief Complaint Chief Complaint  Patient presents with  . Cough  . Generalized Body Aches    HPI Aza Huot is a 20 y.o. female history of seizures presenting today for evaluation of cough sore throat and body aches.  Reports symptoms began 2 days ago.  Reports grandmother at home positive for Covid.  Main complaint is body aches and sore throat. HPI  Past Medical History:  Diagnosis Date  . Seizure (HCC)   . Seizures [facility])     Patient Active Problem List   Diagnosis Date Noted  . Family history of brain cancer 05/20/2015  . 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 Well child check 04/26/2011  . DEVELOPMENTAL DELAY 08/02/2010  . Seizures (HCC) 10/31/2006    Past Surgical History:  Procedure Laterality Date  . LEG SURGERY      OB History   No obstetric history on file.      Home Medications    Prior to Admission medications   Medication Sig Start Date End Date Taking? Authorizing Provider  benzonatate (TESSALON) 200 MG capsule Take 1 capsule (200 mg total) by mouth 3 (three) times daily as needed for up to 7 days for cough. 09/10/20 09/17/20 Yes Ravon Mcilhenny C, [name]  dextromethorphan-guaiFENesin (MUCINEX DM) 30-600 MG 12hr tablet Take 1 tablet by mouth 2 (two) times daily. 09/10/20  Yes Myriah Boggus C, [name]  ibuprofen (ADVIL) 800 MG tablet Take 1 tablet (800 mg total) by mouth 3 (three) times daily. 09/10/20  Yes Triniti Gruetzmacher C, [name]  cyclobenzaprine (FLEXERIL) 5 MG tablet Take 1-2 tablets (5-10 mg total) by mouth 2 (two) times daily as needed for muscle spasms. Patient not taking: Reported on 09/10/2020 08/17/20   Skylan Gift C, [name]  diazepam (DIASAT) 20 MG GEL Place 20 mg rectally once as needed for up to 1 dose. Use in the setting of seizure lasting longer than 5 minutes. Call 9-1-1 promptly after use. 12/15/19   [name], [name]  divalproex (DEPAKOTE SPRINKLE)  125 MG capsule Take 3 capsules (375 mg total) by mouth 2 (two) times daily. 12/09/18   Nestor Ramp, MD  divalproex (DEPAKOTE SPRINKLE) 125 MG capsule TAKE 2 CAPSULES (250 MG TOTAL) BY MOUTH 2 (TWO) TIMES DAILY. 12/28/19   Nestor Ramp, MD  divalproex (DEPAKOTE SPRINKLE) 125 MG capsule TAKE 2 CAPSULES BY MOUTH TWICE A DAY 05/11/20   Nestor Ramp, MD  fluticasone [facility]) 50 MCG/ACT nasal spray Place 1-2 sprays into both nostrils daily. 08/17/20   Lilo Wallington C, [name]  hydrocortisone 2.5 % lotion Apply topically 2 (two) times daily. 04/04/18   Wurst, [place], [name]  loratadine (CLARITIN) 10 MG tablet Take 1 tablet (10 mg total) by mouth daily. 10/15/18   Nestor Ramp, MD  meloxicam (MOBIC) 7.5 MG tablet Take 1 tablet (7.5 mg total) by mouth daily. 11/04/19   Hall-Potvin, [place], [name]  omeprazole (PRILOSEC) 20 MG capsule Take 1 capsule (20 mg total) by mouth daily. 11/04/19   Hall-Potvin, [place], [name]  ondansetron (ZOFRAN ODT) 4 MG disintegrating tablet Take 1 tablet (4 mg total) by mouth every 8 (eight) hours as needed for nausea or vomiting. 08/17/20   Dylyn Mclaren C, [name]  ondansetron (ZOFRAN) 4 MG tablet Take 1 tablet (4 mg total) by mouth every 6 (six) hours. 11/04/19   Hall-Potvin, [place], [name]  potassium chloride 20  MEQ TBCR Take 10 mEq by mouth daily. 11/20/18   Little, Ambrose [place], MD    Family History Family History  Problem Relation Age of Onset  . Healthy Mother   . Seizures Father     Social History Social History   Tobacco Use  . Smoking status: Never Smoker  . Smokeless tobacco: Never Used  Substance Use Topics  . Alcohol use: No  . Drug use: No     Allergies   Codeine, Latex, and Tdap [tetanus-diphth-acell pertussis]   Review of Systems Review of Systems  Constitutional: Negative for activity change, appetite change, chills, fatigue and fever.  HENT: Positive for congestion, rhinorrhea and sore throat. Negative for ear pain, sinus pressure and trouble swallowing.    Eyes: Negative for discharge and redness.  Respiratory: Positive for cough. Negative for chest tightness and shortness of breath.   Cardiovascular: Negative for chest pain.  Gastrointestinal: Negative for abdominal pain, diarrhea, nausea and vomiting.  Musculoskeletal: Positive for myalgias.  Skin: Negative for rash.  Neurological: Negative for dizziness, light-headedness and headaches.     Physical Exam Triage Vital Signs ED Triage Vitals [09/10/20 0941]  Enc Vitals Group     BP 117/76     Pulse Rate 99     Resp 18     Temp 98.2 F (36.8 C)     Temp Source Oral     SpO2 98 %     Weight      Height      Head Circumference      Peak Flow      Pain Score 5     Pain Loc      Pain Edu?      Excl. in GC?    No data found.  Updated Vital Signs BP 117/76 (BP Location: Left Arm)   Pulse 99   Temp 98.2 F (36.8 C) (Oral)   Resp 18   SpO2 98%   Visual Acuity Right Eye Distance:   Left Eye Distance:   Bilateral Distance:    Right Eye Near:   Left Eye Near:    Bilateral Near:     Physical Exam Vitals and nursing note reviewed.  Constitutional:      Appearance: She is well-developed and well-nourished.     Comments: No acute distress  HENT:     Head: Normocephalic and atraumatic.     Ears:     Comments: Bilateral ears without tenderness to palpation of external auricle, tragus and mastoid, EAC's without erythema or swelling, TM's with good bony landmarks and cone of light. Non erythematous.     Nose: Nose normal.     Mouth/Throat:     Comments: Oral mucosa pink and moist, no tonsillar enlargement or exudate. Posterior pharynx patent and nonerythematous, no uvula deviation or swelling. Normal phonation. Eyes:     Conjunctiva/sclera: Conjunctivae normal.  Cardiovascular:     Rate and Rhythm: Normal rate and regular rhythm.  Pulmonary:     Effort: Pulmonary effort is normal. No respiratory distress.     Comments: Breathing comfortably at rest, CTABL, no wheezing,  rales or other adventitious sounds auscultated Abdominal:     General: There is no distension.  Musculoskeletal:        General: Normal range of motion.     Cervical back: Neck supple.  Skin:    General: Skin is warm and dry.  Neurological:     Mental Status: She is alert and oriented to person, place, and time.  Psychiatric:        Mood and Affect: Mood and affect normal.      UC Treatments / Results  Labs (all labs ordered are listed, but only abnormal results are displayed) Labs Reviewed  NOVEL CORONAVIRUS, NAA    EKG   Radiology No results found.  Procedures Procedures (including critical care time)  Medications Ordered in UC Medications - No data to display  Initial Impression / Assessment and Plan / UC Course  I have reviewed the triage vital signs and the nursing notes.  Pertinent labs & imaging results that were available during my care of the patient were reviewed by me and considered in my medical decision making (see chart for details).    , URI with cough-Covid test pending, recommend symptomatic and supportive care.  Exam reassuring today.  Continue to monitor,Discussed strict return precautions. Patient verbalized understanding and is agreeable with plan.  Final Clinical Impressions(s) / UC Diagnoses   Final diagnoses:  Encounter for screening laboratory testing for COVID-19 virus  Viral URI with cough     Discharge Instructions     Covid test pending, monitor my chart for results Ibuprofen and Tylenol for fevers body aches headaches, sore throat Mucinex DM to further help with cough and congestion Tessalon for cough every 8 hours as needed Or may use other over-the-counter medicine as needed to help with symptoms Rest and fluids Follow-up if not improving or worsening    ED Prescriptions    Medication Sig Dispense Auth. Provider   ibuprofen (ADVIL) 800 MG tablet Take 1 tablet (800 mg total) by mouth 3 (three) times daily. 21 tablet  Adoni Greenough C, [name]   benzonatate (TESSALON) 200 MG capsule Take 1 capsule (200 mg total) by mouth 3 (three) times daily as needed for up to 7 days for cough. 28 capsule Ameliah Baskins C, [name]   dextromethorphan-guaiFENesin (MUCINEX DM) 30-600 MG 12hr tablet Take 1 tablet by mouth 2 (two) times daily. 20 tablet Ruhee Enck, [place] C, [name]     PDMP not reviewed this encounter.   Lew Dawes, [name] 09/10/20 1058

## 2020-09-10 NOTE — Discharge Instructions (Signed)
Covid test pending, monitor my chart for results Ibuprofen and Tylenol for fevers body aches headaches, sore throat Mucinex DM to further help with cough and congestion Tessalon for cough every 8 hours as needed Or may use other over-the-counter medicine as needed to help with symptoms Rest and fluids Follow-up if not improving or worsening

## 2020-09-10 NOTE — ED Triage Notes (Signed)
Pt sts cough, sore throat and body aches x 2 days

## 2020-09-14 LAB — NOVEL CORONAVIRUS, NAA: SARS-CoV-2, NAA: NOT DETECTED

## 2020-10-05 DIAGNOSIS — F339 Major depressive disorder, recurrent, unspecified: Secondary | ICD-10-CM | POA: Diagnosis not present

## 2020-10-13 DIAGNOSIS — F339 Major depressive disorder, recurrent, unspecified: Secondary | ICD-10-CM | POA: Diagnosis not present

## 2020-10-25 DIAGNOSIS — F339 Major depressive disorder, recurrent, unspecified: Secondary | ICD-10-CM | POA: Diagnosis not present

## 2020-11-02 ENCOUNTER — Telehealth: Payer: Self-pay

## 2020-11-02 ENCOUNTER — Other Ambulatory Visit: Payer: Self-pay

## 2020-11-02 ENCOUNTER — Ambulatory Visit
Admission: EM | Admit: 2020-11-02 | Discharge: 2020-11-02 | Disposition: A | Discharge status: Home / Self Care | Payer: Medicaid Other | Attending: Family Medicine | Admitting: Family Medicine

## 2020-11-02 DIAGNOSIS — J392 Other diseases of pharynx: Secondary | ICD-10-CM | POA: Diagnosis not present

## 2020-11-02 DIAGNOSIS — J029 Acute pharyngitis, unspecified: Secondary | ICD-10-CM | POA: Insufficient documentation

## 2020-11-02 LAB — POCT RAPID STREP A (OFFICE): Rapid Strep A Screen: NEGATIVE

## 2020-11-02 MED ORDER — METHYLPREDNISOLONE SODIUM SUCC 125 MG IJ SOLR: 80.0000 mg | Freq: Once | INTRAMUSCULAR | Status: DC

## 2020-11-02 MED ORDER — PREDNISONE 20 MG PO TABS: 20.0000 mg | ORAL_TABLET | Freq: Every day | ORAL | 0 refills | Status: DC

## 2020-11-02 MED ORDER — CEFTRIAXONE SODIUM 500 MG IJ SOLR: 500.0000 mg | Freq: Once | INTRAMUSCULAR | Status: DC

## 2020-11-02 MED ORDER — DOXYCYCLINE HYCLATE 100 MG PO CAPS: 100.0000 mg | ORAL_CAPSULE | Freq: Two times a day (BID) | ORAL | 0 refills | Status: DC

## 2020-11-02 MED ORDER — LIDOCAINE VISCOUS HCL 2 % MT SOLN: 15.0000 mL | OROMUCOSAL | 0 refills | Status: DC | PRN

## 2020-11-02 MED ORDER — CEFTRIAXONE SODIUM 500 MG IJ SOLR
500.0000 mg | Freq: Once | INTRAMUSCULAR | Status: AC
  Administered 2020-11-02: 500 mg via INTRAMUSCULAR

## 2020-11-02 MED ORDER — METHYLPREDNISOLONE SODIUM SUCC 125 MG IJ SOLR
80.0000 mg | Freq: Once | INTRAMUSCULAR | Status: AC
  Administered 2020-11-02: 80 mg via INTRAMUSCULAR

## 2020-11-02 NOTE — ED Triage Notes (Signed)
Pt c/o sore throat, body aches and headache since yesterday. States pain to swallow or eat.

## 2020-11-02 NOTE — Discharge Instructions (Addendum)
Complete all medication as prescribed. If throat pain or throat swelling worsens go immediately to the emergency department as these are signs of a worsening infection.  Otherwise complete all medication as prescribed.  You are mildly dehydrated force fluids and you can also substitute fluids for popsicles or eating ice. Continue Tylenol or ibuprofen for management of pain and or fever.

## 2020-11-02 NOTE — ED Provider Notes (Signed)
[name]    CSN: [id number] Arrival date & time: 11/02/20  1157      History   Chief Complaint Chief Complaint  Patient presents with  . Sore Throat    HPI [name] is a 20 y.o. female.   HPI Patient presents today with 3 days of gradually worsening sore throat.  Overnight she had difficulty with swallowing and increased tenderness with cervical adenopathy.  She took a Tylenol last night and is unaware if she had fever.  He has a low-grade fever this morning.  Patient endorses a distant history of strep.  She denies any oral sexual activity. Past Medical History:  Diagnosis Date  . Seizure (HCC)   . Seizures [facility])     Patient Active Problem List   Diagnosis Date Noted  . Family history of brain cancer 05/20/2015  . 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 Well child check 04/26/2011  . DEVELOPMENTAL DELAY 08/02/2010  . Seizures (HCC) 10/31/2006    Past Surgical History:  Procedure Laterality Date  . LEG SURGERY      OB History   No obstetric history on file.      Home Medications    Prior to Admission medications   Medication Sig Start Date End Date Taking? Authorizing Provider  dextromethorphan-guaiFENesin (MUCINEX DM) 30-600 MG 12hr tablet Take 1 tablet by mouth 2 (two) times daily. 09/10/20   Wieters, Hallie C, [name]  diazepam (DIASAT) 20 MG GEL Place 20 mg rectally once as needed for up to 1 dose. Use in the setting of seizure lasting longer than 5 minutes. Call 9-1-1 promptly after use. 12/15/19   [name], [name]  divalproex (DEPAKOTE SPRINKLE) 125 MG capsule Take 3 capsules (375 mg total) by mouth 2 (two) times daily. 12/09/18   Nestor Ramp, MD  divalproex (DEPAKOTE SPRINKLE) 125 MG capsule TAKE 2 CAPSULES (250 MG TOTAL) BY MOUTH 2 (TWO) TIMES DAILY. 12/28/19   Nestor Ramp, MD  divalproex (DEPAKOTE SPRINKLE) 125 MG capsule TAKE 2 CAPSULES BY MOUTH TWICE A DAY 05/11/20   Nestor Ramp, MD  fluticasone [facility]) 50 MCG/ACT nasal spray Place 1-2  sprays into both nostrils daily. 08/17/20   Wieters, Hallie C, [name]  hydrocortisone 2.5 % lotion Apply topically 2 (two) times daily. 04/04/18   Wurst, [place], [name]  ibuprofen (ADVIL) 800 MG tablet Take 1 tablet (800 mg total) by mouth 3 (three) times daily. 09/10/20   Wieters, Hallie C, [name]  loratadine (CLARITIN) 10 MG tablet Take 1 tablet (10 mg total) by mouth daily. 10/15/18   Nestor Ramp, MD  meloxicam (MOBIC) 7.5 MG tablet Take 1 tablet (7.5 mg total) by mouth daily. 11/04/19   Hall-Potvin, [place], [name]  omeprazole (PRILOSEC) 20 MG capsule Take 1 capsule (20 mg total) by mouth daily. 11/04/19   Hall-Potvin, [place], [name]  ondansetron (ZOFRAN ODT) 4 MG disintegrating tablet Take 1 tablet (4 mg total) by mouth every 8 (eight) hours as needed for nausea or vomiting. 08/17/20   Wieters, Hallie C, [name]  ondansetron (ZOFRAN) 4 MG tablet Take 1 tablet (4 mg total) by mouth every 6 (six) hours. 11/04/19   Hall-Potvin, [place], [name]  potassium chloride 20 MEQ TBCR Take 10 mEq by mouth daily. 11/20/18   Little, Ambrose Finland, MD    Family History Family History  Problem Relation Age of Onset  . Healthy Mother   . Seizures Father     Social History Social History   Tobacco Use  .  Smoking status: Never Smoker  . Smokeless tobacco: Never Used  Substance Use Topics  . Alcohol use: No  . Drug use: No     Allergies   Codeine, Latex, and Tdap [tetanus-diphth-acell pertussis]   Review of Systems Review of Systems  Pertinent negatives listed in HPI  .  Physical Exam Triage Vital Signs ED Triage Vitals [11/02/20 1214]  Enc Vitals Group     BP 116/78     Pulse Rate (!) 123     Resp 18     Temp 99.1 F (37.3 C)     Temp Source Oral     SpO2 96 %     Weight      Height      Head Circumference      Peak Flow      Pain Score 10     Pain Loc      Pain Edu?      Excl. in GC?    No data found.  Updated Vital Signs BP 116/78 (BP Location: Left Arm)   Pulse (!) 123   Temp 99.1 F  (37.3 C) (Oral)   Resp 18   SpO2 96%   Visual Acuity Right Eye Distance:   Left Eye Distance:   Bilateral Distance:    Right Eye Near:   Left Eye Near:    Bilateral Near:     Physical Exam Constitutional:      Appearance: She is well-developed.  HENT:     Right Ear: Tympanic membrane normal.     Left Ear: Tympanic membrane and ear canal normal.     Mouth/Throat:     Pharynx: Pharyngeal swelling present.     Tonsils: Tonsillar exudate present. 2+ on the right. 2+ on the left.  Cardiovascular:     Rate and Rhythm: Normal rate and regular rhythm.  Pulmonary:     Effort: Pulmonary effort is normal.     Breath sounds: Normal breath sounds.  Lymphadenopathy:     Cervical: Cervical adenopathy present.  Neurological:     Mental Status: She is alert.     GCS: GCS eye subscore is 4. GCS verbal subscore is 5. GCS motor subscore is 6.     UC Treatments / Results  Labs (all labs ordered are listed, but only abnormal results are displayed) Labs Reviewed  POCT RAPID STREP A (OFFICE)    EKG   Radiology No results found.  Procedures Procedures (including critical care time)  Medications Ordered in UC Medications - No data to display  Initial Impression / Assessment and Plan / UC Course  I have reviewed the triage vital signs and the nursing notes.  Pertinent labs & imaging results that were available during my care of the patient were reviewed by me and considered in my medical decision making (see chart for details).    Acute pharyngitis and acute pharyngeal swelling. Doxycyline  100 mg twice daily x 7 days Lidocaine viscous PRN for pain Prednisone 20 mg once daily x 5 days Return precautions discussed  Final Clinical Impressions(s) / UC Diagnoses   Final diagnoses:  Acute pharyngitis, unspecified etiology  Pharyngeal swelling     Discharge Instructions     Complete all medication as prescribed. If throat pain or throat swelling worsens go immediately to  the emergency department as these are signs of a worsening infection.  Otherwise complete all medication as prescribed.  You are mildly dehydrated force fluids and you can also substitute fluids for popsicles or  eating ice. Continue Tylenol or ibuprofen for management of pain and or fever.    ED Prescriptions    Medication Sig Dispense Auth. Provider   doxycycline (VIBRAMYCIN) 100 MG capsule Take 1 capsule (100 mg total) by mouth 2 (two) times daily for 7 days. 14 capsule Bing Neighbors, FNP   predniSONE (DELTASONE) 20 MG tablet Take 1 tablet (20 mg total) by mouth daily with breakfast for 5 days. 5 tablet Bing Neighbors, FNP   lidocaine (XYLOCAINE) 2 % solution Use as directed 15 mLs in the mouth or throat as needed for mouth pain (Mix with warm salt water gargling spit). 100 mL Bing Neighbors, FNP     PDMP not reviewed this encounter.   Bing Neighbors, FNP 11/06/20 2129

## 2020-11-02 NOTE — Telephone Encounter (Signed)
Add on strep culture per Kim FNP 

## 2020-11-03 ENCOUNTER — Ambulatory Visit: Payer: Medicaid Other | Admitting: Family Medicine

## 2020-11-04 ENCOUNTER — Encounter: Payer: Self-pay | Admitting: Family Medicine

## 2020-11-04 ENCOUNTER — Ambulatory Visit (INDEPENDENT_AMBULATORY_CARE_PROVIDER_SITE_OTHER): Payer: Medicaid Other | Admitting: Family Medicine

## 2020-11-04 ENCOUNTER — Other Ambulatory Visit: Payer: Self-pay

## 2020-11-04 VITALS — BP 100/60 | HR 88 | Ht 63.0 in | Wt 110.0 lb

## 2020-11-04 DIAGNOSIS — Z3046 Encounter for surveillance of implantable subdermal contraceptive: Secondary | ICD-10-CM | POA: Insufficient documentation

## 2020-11-04 DIAGNOSIS — N926 Irregular menstruation, unspecified: Secondary | ICD-10-CM | POA: Diagnosis not present

## 2020-11-04 DIAGNOSIS — R569 Unspecified convulsions: Secondary | ICD-10-CM

## 2020-11-04 LAB — POCT URINE PREGNANCY: Preg Test, Ur: NEGATIVE

## 2020-11-04 NOTE — Patient Instructions (Addendum)
Thank you for coming in to see [place] today! Please see below to review our plan for today's visit:  1. Look for a prenatal vitamin, especially vitamin B9/Folic Acid aka Folate. Take at least daily. 2. You can become pregnant as soon as your nexplanon is removed.    Please call the clinic at [phone] if your symptoms worsen or you have any concerns. It was our pleasure to serve you!   Dr. Peggyann Shoals [facility]

## 2020-11-04 NOTE — Progress Notes (Addendum)
    SUBJECTIVE:   CHIEF COMPLAINT / HPI:   Nexplanon removal: Patient presents to clinic today for Nexplanon removal.  States she is interested in becoming pregnant.  Patient is not currently taking a prenatal vitamin and was counseled about the importance of taking a prenatal vitamin with folic acid.  Patient also reports irregular periods and that she has not had 1 since last year, however that can be normal with Nexplanon.  No concern for vaginal bleeding, vaginal discharge, itch, odor, or STI.  PERTINENT  PMH / PSH:  Patient Active Problem List   Diagnosis Date Noted  . Encounter for Nexplanon removal 11/04/2020  . Family history of brain cancer 05/20/2015  . Seizures (HCC) 10/31/2006    OBJECTIVE:   BP 100/60   Pulse 88   Ht 5\' 3"  (1.6 m)   Wt 110 lb (49.9 kg)   LMP 09/04/2019   SpO2 98%   BMI 19.49 kg/m    Physical exam: General: Pleasant patient, no apparent distress Respiratory: CTA bilaterally with comfortable work of breathing on room air Cardio: RRR, S1-S2 present, no murmurs appreciated  PROCEDURE NOTE: NEXPLANON  REMOVAL Patient given informed consent and signed copy in the chart.  Left arm area prepped and draped in the usual sterile fashion. Three cc of lidocaine without epinephrine 1% used for local anesthesia. A small stab incision was made close to the distal end of the nexplanon with scalpel. Hemostats were used to withdraw the nexplanon. A small bandage was applied over stab incision in close approximation was achieved.  Patient's incision site then covered with gauze and wrapped with Coban.  No complications. Patient given follow up instructions should she experience redness, swelling at sight or fever in the next 24 hours. Patient was reminded this totally removes her nexplanon contraceptive devise (she can now potentially conceive).   ASSESSMENT/PLAN:   Seizures Patient with history of seizures in 2020 and again in 2021.  Patient has been prescribed  Depakote, has history of medication noncompliance with her Depakote.  Concerned that patient is on Depakote and desires pregnancy.  Patient was supposed to follow-up with her neurologist in 2021, however missed the new patient appointment. -Encourage patient to avoid pregnancy until she is able to be seen by neurologist and have medication changed  Encounter for Nexplanon removal Patient desires for Nexplanon to be removed.  Nexplanon was placed in September 2016 (6 years ago).  Also believes that is potentially caused her to gain weight.  Concerningly, patient with history of seizure disorder on Depakote desires pregnancy.  Patient also currently prescribed doxycycline for pharyngitis/upper respiratory infection. -Patient was counseled on risk of deformities and neural tube defects with Depakote taken while pregnant. Forms of contraception were discussed such as OCPs and patient declined. -Medication list cleaned up -Patient counseled on the importance of taking prenatal vitamin with folic acid -Patient encouraged to follow-up with neurologist for management of seizures disorder and Depakote medication    October 2016, DO [facility]

## 2020-11-06 ENCOUNTER — Encounter: Payer: Self-pay | Admitting: Family Medicine

## 2020-11-06 LAB — CULTURE, GROUP A STREP (THRC)

## 2020-11-06 NOTE — Assessment & Plan Note (Signed)
Patient with history of seizures in 2020 and again in 2021.  Patient has been prescribed Depakote, has history of medication noncompliance with her Depakote.  Concerned that patient is on Depakote and desires pregnancy.  Patient was supposed to follow-up with her neurologist in 2021, however missed the new patient appointment. -Encourage patient to avoid pregnancy until she is able to be seen by neurologist and have medication changed

## 2020-11-06 NOTE — Assessment & Plan Note (Addendum)
Patient desires for Nexplanon to be removed.  Nexplanon was placed in September 2016 (6 years ago).  Also believes that is potentially caused her to gain weight.  Concerningly, patient with history of seizure disorder on Depakote desires pregnancy.  Patient also currently prescribed doxycycline for pharyngitis/upper respiratory infection. -Patient was counseled on risk of deformities and neural tube defects with Depakote taken while pregnant. Forms of contraception were discussed such as OCPs and patient declined. -Medication list cleaned up -Patient counseled on the importance of taking prenatal vitamin with folic acid -Patient encouraged to follow-up with neurologist for management of seizures disorder and Depakote medication

## 2020-11-10 ENCOUNTER — Other Ambulatory Visit: Payer: Self-pay

## 2020-11-10 ENCOUNTER — Ambulatory Visit
Admission: EM | Admit: 2020-11-10 | Discharge: 2020-11-10 | Disposition: A | Discharge status: Home / Self Care | Payer: Medicaid Other | Attending: Emergency Medicine | Admitting: Emergency Medicine

## 2020-11-10 DIAGNOSIS — H6002 Abscess of left external ear: Secondary | ICD-10-CM | POA: Diagnosis not present

## 2020-11-10 LAB — POCT RAPID STREP A (OFFICE): Rapid Strep A Screen: NEGATIVE

## 2020-11-10 MED ORDER — DOXYCYCLINE HYCLATE 100 MG PO CAPS: 100.0000 mg | ORAL_CAPSULE | Freq: Two times a day (BID) | ORAL | 0 refills | Status: AC

## 2020-11-10 MED ORDER — IBUPROFEN 800 MG PO TABS: 800.0000 mg | ORAL_TABLET | Freq: Three times a day (TID) | ORAL | 0 refills | Status: DC

## 2020-11-10 NOTE — Discharge Instructions (Addendum)
Begin doxycycline twice daily  Use anti-inflammatories for pain/swelling. You may take up to 800 mg Ibuprofen every 8 hours with food. You may supplement Ibuprofen with Tylenol [phone] mg every 8 hours.  Follow up if not improving or worsening

## 2020-11-10 NOTE — ED Provider Notes (Signed)
[name]    CSN: [id number] Arrival date & time: 11/10/20  1612      History   Chief Complaint Chief Complaint  Patient presents with  . Sore Throat  . Otalgia    HPI [name] is a 20 y.o. female presenting today for evaluation of sore throat and ear pain.  Patient reports that over the past 1 to 2 days has developed discomfort in her left ear with associated discomfort with swallowing.  Reports associated body aches.  She denies any known fevers.  Denies close sick contacts.  Denies congestion or cough.  HPI  Past Medical History:  Diagnosis Date  . Seizure (HCC)   . Seizures (HCC)   . Well child check 04/26/2011    Patient Active Problem List   Diagnosis Date Noted  . Encounter for Nexplanon removal 11/04/2020  . Family history of brain cancer 05/20/2015  . Seizures (HCC) 10/31/2006    Past Surgical History:  Procedure Laterality Date  . LEG SURGERY      OB History   No obstetric history on file.      Home Medications    Prior to Admission medications   Medication Sig Start Date End Date Taking? Authorizing Provider  doxycycline (VIBRAMYCIN) 100 MG capsule Take 1 capsule (100 mg total) by mouth 2 (two) times daily for 10 days. 11/10/20 11/20/20 Yes Lizzy Hamre C, [name]  ibuprofen (ADVIL) 800 MG tablet Take 1 tablet (800 mg total) by mouth 3 (three) times daily. 11/10/20  Yes Yusef Lamp C, [name]  diazepam (DIASAT) 20 MG GEL Place 20 mg rectally once as needed for up to 1 dose. Use in the setting of seizure lasting longer than 5 minutes. Call 9-1-1 promptly after use. 12/15/19   [name], [name]  divalproex (DEPAKOTE SPRINKLE) 125 MG capsule TAKE 2 CAPSULES BY MOUTH TWICE A DAY 05/11/20   Nestor Ramp, MD  fluticasone [facility]) 50 MCG/ACT nasal spray Place 1-2 sprays into both nostrils daily. 08/17/20   Yerlin Gasparyan C, [name]  loratadine (CLARITIN) 10 MG tablet Take 1 tablet (10 mg total) by mouth daily. 10/15/18   Nestor Ramp, MD     Family History Family History  Problem Relation Age of Onset  . Healthy Mother   . Seizures Father     Social History Social History   Tobacco Use  . Smoking status: Never Smoker  . Smokeless tobacco: Never Used  Substance Use Topics  . Alcohol use: No  . Drug use: No     Allergies   Codeine, Latex, and Tdap [tetanus-diphth-acell pertussis]   Review of Systems Review of Systems  Constitutional: Negative for activity change, appetite change, chills, fatigue and fever.  HENT: Positive for ear pain and sore throat. Negative for congestion, rhinorrhea, sinus pressure and trouble swallowing.   Eyes: Negative for discharge and redness.  Respiratory: Negative for cough, chest tightness and shortness of breath.   Cardiovascular: Negative for chest pain.  Gastrointestinal: Negative for abdominal pain, diarrhea, nausea and vomiting.  Musculoskeletal: Negative for myalgias.  Skin: Negative for rash.  Neurological: Negative for dizziness, light-headedness and headaches.     Physical Exam Triage Vital Signs ED Triage Vitals  Enc Vitals Group     BP 11/10/20 1713 122/79     Pulse Rate 11/10/20 1713 100     Resp 11/10/20 1713 18     Temp 11/10/20 1713 98.7 F (37.1 C)     Temp Source 11/10/20 1713 Oral  SpO2 11/10/20 1713 97 %     Weight --      Height --      Head Circumference --      Peak Flow --      Pain Score 11/10/20 1737 10     Pain Loc --      Pain Edu? --      Excl. in GC? --    No data found.  Updated Vital Signs BP 122/79 (BP Location: Right Arm)   Pulse 100   Temp 98.7 F (37.1 C) (Oral)   Resp 18   SpO2 97%   Visual Acuity Right Eye Distance:   Left Eye Distance:   Bilateral Distance:    Right Eye Near:   Left Eye Near:    Bilateral Near:     Physical Exam Vitals and nursing note reviewed.  Constitutional:      Appearance: She is well-developed.     Comments: No acute distress  HENT:     Head: Normocephalic and atraumatic.      Ears:     Comments: Left external auricle with area of swelling erythema tenderness and pustular area noted right at opening of canal, canal without any erythema or swelling, TM intact, pearly gray with good bony landmarks    Nose: Nose normal.     Mouth/Throat:     Comments: Oral mucosa pink and moist, no tonsillar enlargement or exudate. Posterior pharynx patent and nonerythematous, no uvula deviation or swelling. Normal phonation. Eyes:     Conjunctiva/sclera: Conjunctivae normal.  Neck:     Comments: Tonsillar lymphadenopathy present, more prominent on left, full active range of motion of neck Cardiovascular:     Rate and Rhythm: Normal rate.  Pulmonary:     Effort: Pulmonary effort is normal. No respiratory distress.  Abdominal:     General: There is no distension.  Musculoskeletal:        General: Normal range of motion.     Cervical back: Neck supple.  Skin:    General: Skin is warm and dry.  Neurological:     Mental Status: She is alert and oriented to person, place, and time.      UC Treatments / Results  Labs (all labs ordered are listed, but only abnormal results are displayed) Labs Reviewed  CULTURE, GROUP A STREP [facility])  POCT RAPID STREP A (OFFICE)    EKG   Radiology No results found.  Procedures Procedures (including critical care time)  Medications Ordered in UC Medications - No data to display  Initial Impression / Assessment and Plan / UC Course  I have reviewed the triage vital signs and the nursing notes.  Pertinent labs & imaging results that were available during my care of the patient were reviewed by me and considered in my medical decision making (see chart for details).     Appears to have abscess on external ear on left, with subsequent lymphadenopathy causing sore throat, strep negative, initiated on doxycycline, recommending anti-inflammatories and warm compresses.  Monitor for gradual resolution, offered Covid testing, patient  declined.  Discussed strict return precautions. Patient verbalized understanding and is agreeable with plan.  Final Clinical Impressions(s) / UC Diagnoses   Final diagnoses:  Abscess of ear canal, left     Discharge Instructions     Begin doxycycline twice daily  Use anti-inflammatories for pain/swelling. You may take up to 800 mg Ibuprofen every 8 hours with food. You may supplement Ibuprofen with Tylenol [phone] mg every 8  hours.  Follow up if not improving or worsening    ED Prescriptions    Medication Sig Dispense Auth. Provider   doxycycline (VIBRAMYCIN) 100 MG capsule Take 1 capsule (100 mg total) by mouth 2 (two) times daily for 10 days. 20 capsule Travonte Byard C, [name]   ibuprofen (ADVIL) 800 MG tablet Take 1 tablet (800 mg total) by mouth 3 (three) times daily. 21 tablet Deena Shaub, [place] C, [name]     PDMP not reviewed this encounter.   Lew Dawes, [place] 11/10/20 1944

## 2020-11-10 NOTE — ED Triage Notes (Signed)
Pt c/o sore throat, lt earache, and body aches since yesterday.

## 2020-11-12 ENCOUNTER — Encounter: Payer: Self-pay | Admitting: Family Medicine

## 2020-11-12 ENCOUNTER — Other Ambulatory Visit: Payer: Self-pay | Admitting: Family Medicine

## 2020-11-12 DIAGNOSIS — R569 Unspecified convulsions: Secondary | ICD-10-CM

## 2020-11-12 MED ORDER — NORGESTIMATE-ETH ESTRADIOL 0.25-35 MG-MCG PO TABS: 1.0000 | ORAL_TABLET | Freq: Every day | ORAL | 3 refills | Status: DC

## 2020-11-12 MED ORDER — FOLIC ACID 1 MG PO TABS: 1.0000 mg | ORAL_TABLET | Freq: Every day | ORAL | 3 refills | Status: DC

## 2020-11-12 MED ORDER — FOLIC ACID 5 MG PO CAPS: 1.0000 | ORAL_CAPSULE | Freq: Every day | ORAL | 3 refills | Status: DC

## 2020-11-12 NOTE — Addendum Note (Signed)
Addended by: Dollene Cleveland on: 11/12/2020 02:07 PM   Modules accepted: Orders

## 2020-11-12 NOTE — Addendum Note (Signed)
Addended by: Dollene Cleveland on: 11/12/2020 02:04 PM   Modules accepted: Orders

## 2020-11-12 NOTE — Progress Notes (Addendum)
Telephone encounter/orders placed - 11/12/2020  Patient was contacted regarding her desire for pregnancy while on Depakote.  At last visit patient's Nexplanon was removed.  However, this Nexplanon had been in for over 4 years.  Patient was called regarding the importance of contacting her neurologist to have her Depakote changed.  However, patient does not have a neurologist.  Her Depakote is currently prescribed by Dr. [name] with the family practice center.  It is important to change this medication before patient tries to conceive.  Patient has been sent a letter about this.  I also contacted the patient today via the telephone number listed in her chart [phone].  Patient answered the phone, I was checking in with her regarding her Nexplanon removal to see how she was healing, to which she said "fine".  I explained to her that taking Depakote can cause severe defects in her baby if she becomes pregnant while on this medication.  The patient said "I know about that, you will have to talk to my mom about that, her name is Latoya."  And then the patient hung up the phone.  I attempted to call patient's mom Latoya at number listed in the chart [phone], I attempted to call 3 times, there was no answer, I left a HIPAA compliant voicemail message stating that I had been asked by her daughter to give her a call regarding her daughters past medical history.  I asked her to try to call us at our clinic [phone].  In the meantime, referral has been placed for patient to establish care with a neurologist.  Patient was also sent a prescription for Sprintec to her pharmacy [facility] [address], as well as prescription for folic acid 5 mg daily.  We will try to contact again and notify PCP.  Peggyann Shoals, DO [facility], PGY-3 11/12/2020 1:35 PM

## 2020-11-13 LAB — CULTURE, GROUP A STREP (THRC)

## 2020-11-21 ENCOUNTER — Emergency Department (HOSPITAL_COMMUNITY)
Admission: EM | Admit: 2020-11-21 | Discharge: 2020-11-22 | Disposition: A | Discharge status: Home / Self Care | Payer: Medicaid Other | Attending: Emergency Medicine | Admitting: Emergency Medicine

## 2020-11-21 DIAGNOSIS — S80812A Abrasion, left lower leg, initial encounter: Secondary | ICD-10-CM | POA: Diagnosis not present

## 2020-11-21 DIAGNOSIS — H16223 Keratoconjunctivitis sicca, not specified as Sjogren's, bilateral: Secondary | ICD-10-CM | POA: Insufficient documentation

## 2020-11-21 DIAGNOSIS — H16203 Unspecified keratoconjunctivitis, bilateral: Secondary | ICD-10-CM

## 2020-11-21 DIAGNOSIS — S8991XA Unspecified injury of right lower leg, initial encounter: Secondary | ICD-10-CM | POA: Diagnosis present

## 2020-11-21 DIAGNOSIS — R000 Tachycardia, unspecified: Secondary | ICD-10-CM | POA: Insufficient documentation

## 2020-11-21 DIAGNOSIS — R569 Unspecified convulsions: Secondary | ICD-10-CM | POA: Diagnosis not present

## 2020-11-21 DIAGNOSIS — H5789 Other specified disorders of eye and adnexa: Secondary | ICD-10-CM

## 2020-11-21 DIAGNOSIS — S80811A Abrasion, right lower leg, initial encounter: Secondary | ICD-10-CM | POA: Insufficient documentation

## 2020-11-21 DIAGNOSIS — T7611XA Adult physical abuse, suspected, initial encounter: Secondary | ICD-10-CM | POA: Diagnosis not present

## 2020-11-21 MED ORDER — FLUORESCEIN SODIUM 1 MG OP STRP
1.0000 | ORAL_STRIP | Freq: Once | OPHTHALMIC | Status: AC
  Administered 2020-11-22: 1 via OPHTHALMIC
  Filled 2020-11-21: qty 1

## 2020-11-21 MED ORDER — FLUORESCEIN SODIUM 1 MG OP STRP
ORAL_STRIP | OPHTHALMIC | Status: AC
  Filled 2020-11-21: qty 2

## 2020-11-21 MED ORDER — TETRACAINE HCL 0.5 % OP SOLN
2.0000 [drp] | Freq: Once | OPHTHALMIC | Status: AC
  Administered 2020-11-22: 2 [drp] via OPHTHALMIC
  Filled 2020-11-21: qty 4

## 2020-11-21 MED ORDER — LACTATED RINGERS IV BOLUS: 1000.0000 mL | Freq: Once | INTRAVENOUS | Status: DC
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
## 2020-11-22 MED ORDER — LORAZEPAM 1 MG PO TABS
1.0000 mg | ORAL_TABLET | Freq: Once | ORAL | Status: AC
  Administered 2020-11-22: 1 mg via ORAL
  Filled 2020-11-22: qty 1
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
## 2020-11-22 NOTE — ED Notes (Signed)
Dc instructions reviewed with pt.  PT verbalized instructions  PT DC.  

## 2020-11-23 ENCOUNTER — Telehealth: Payer: Self-pay | Admitting: *Deleted

## 2020-11-23 NOTE — Telephone Encounter (Signed)
Transition Care Management Unsuccessful Follow-up Telephone Call  Date of discharge and from where:  11-22-20 [name]  Attempts:  1st  Reason for unsuccessful TCM follow-up call:  No answer/ left message

## 2020-11-24 ENCOUNTER — Telehealth: Payer: Self-pay | Admitting: *Deleted

## 2020-11-24 NOTE — Telephone Encounter (Signed)
Transition Care Management Follow-up Telephone Call  Date of discharge and from where: 11/22/2020 - [facility]  How have you been since you were released from the hospital? "I am okay"  Any questions or concerns? No  Items Reviewed:  Did the pt receive and understand the discharge instructions provided? Yes   Medications obtained and verified? Yes   Other? No   Any new allergies since your discharge? No   Dietary orders reviewed? No  Do you have support at home? Yes    Functional Questionnaire: (I = Independent and D = Dependent) ADLs: I  Bathing/Dressing- I  Meal Prep- I  Eating- I  Maintaining continence- I  Transferring/Ambulation- I  Managing Meds- I  Follow up appointments reviewed:   PCP Hospital f/u appt confirmed? No    Specialist Hospital f/u appt confirmed? No    Are transportation arrangements needed? No   If their condition worsens, is the pt aware to call PCP or go to the Emergency Dept.? Yes  Was the patient provided with contact information for the PCP's office or ED? Yes  Was to pt encouraged to call back with questions or concerns? Yes

## 2020-11-30 ENCOUNTER — Encounter: Payer: Self-pay | Admitting: *Deleted

## 2020-12-13 DIAGNOSIS — F339 Major depressive disorder, recurrent, unspecified: Secondary | ICD-10-CM | POA: Diagnosis not present

## 2020-12-18 NOTE — Progress Notes (Deleted)
Error . Please see following note

## 2020-12-19 ENCOUNTER — Ambulatory Visit: Payer: Medicaid Other | Admitting: Family Medicine

## 2020-12-21 ENCOUNTER — Other Ambulatory Visit: Payer: Self-pay | Admitting: Family Medicine

## 2020-12-21 NOTE — Progress Notes (Signed)
  Called patient on 4/18 due to missed visit to check on her, and patient did not answer the phone.  Called grandmother and she told me that patient was not feeling well which is why she did not make it to the appointment.  I discussed the importance of having her come in when she is better, and grandmother stated that she will call to make that appointment.  Cora Collum, D.O. [facility], PGY-1

## 2020-12-22 ENCOUNTER — Telehealth: Payer: Self-pay | Admitting: Family Medicine

## 2020-12-22 NOTE — Telephone Encounter (Signed)
Telephone encounter  Late entry.  In the last ditch effort to contact the patient and get her scheduled at the clinic for follow-up regarding her seizure disorder on Depakote while not currently being on any form of birth control, on 4/12/222 I contacted the patient's grandmother listed in her chart Dia Crawford number [phone].  Ms. Constance Haw answered.  I explained to her the concerns with her granddaughter/the patient being on Depakote and not being on any form of birth control.  I explained to her the risks that could occur if the patient were to become pregnant, stating the baby could have severe deformity or being noncompatible with life.  Explained to her that we had tried to contact the patient directly but she had blocked her number and was not answering her calls.  Ms. Constance Haw stated, "schedule the patient for an appointment and I will make sure she is there."  Patient scheduled for 12/19/2020 with her PCP Dr. Idalia Needle.  I also reached out to the referral coordinator and asked that they change the contact information for the neurology referral to the grandmothers phone number instead of the patient as she has been avoiding/blocking calls.   Peggyann Shoals, DO [facility], PGY-3

## 2020-12-26 ENCOUNTER — Telehealth: Payer: Self-pay

## 2020-12-26 NOTE — Telephone Encounter (Signed)
Called and LVM for patient to call [facility] to make appointment with PCP.  Glennie Hawk, CMA

## 2020-12-27 DIAGNOSIS — F339 Major depressive disorder, recurrent, unspecified: Secondary | ICD-10-CM | POA: Diagnosis not present

## 2020-12-28 ENCOUNTER — Telehealth: Payer: Self-pay | Admitting: Family Medicine

## 2020-12-28 NOTE — Telephone Encounter (Signed)
Outgoing telephone call  Contacted the patient's grandmother [name] at number listed in the chart [phone].  Ms. Constance Haw answer the phone, was very pleasant.  I asked her about the appointment that [name] recently missed when she was feeling acutely ill.  I asked her when she would be able to follow-up and if I can help schedule an appointment for her.  Ms. Constance Haw says she would like for the patient to call us tomorrow to schedule appointment with [place].  I expressed my concern to Ms. McCray that we have tried to contact the patient to schedule an appointment; however, there was no answer and I was concerned that her numbers have been blocked.  Ms. Constance Haw reports that the patient no longer has a phone (the [phone] number is currently out of order).  I told Ms. McCray that we need to see the patient and that we may not be able to prescribe [name] her seizure medicine.  I instructed her it was important that she come to the clinic for evaluation and detailed discussion regarding her medication.  Ms. Constance Haw inform me that she will have Stepanie call us in the morning to reschedule an appointment.  Peggyann Shoals, DO [facility], PGY-3 12/28/2020 5:49 PM

## 2021-01-11 DIAGNOSIS — F339 Major depressive disorder, recurrent, unspecified: Secondary | ICD-10-CM | POA: Diagnosis not present

## 2021-01-18 NOTE — Progress Notes (Deleted)
      SUBJECTIVE:   CHIEF COMPLAINT / HPI:   Per note with Dr. Dareen Piano:   Patient desires for Nexplanon to be removed.  Nexplanon was placed in September 2016 (6 years ago).  Also believes that is potentially caused her to gain weight.  Concerningly, patient with history of seizure disorder on Depakote desires pregnancy.  -Patient was counseled on risk of deformities and neural tube defects with Depakote taken while pregnant. Forms of contraception were discussed such as OCPs and patient declined. -Patient counseled on the importance of taking prenatal vitamin with folic acid -Patient encouraged to follow-up with neurologist for management of seizures disorder and Depakote medication   PERTINENT  PMH / PSH: ***  OBJECTIVE:   There were no vitals taken for this visit.  ***  ASSESSMENT/PLAN:   No problem-specific Assessment & Plan notes found for this encounter.   Health maintenance  HPV, HIV, Hep C, Pap, Tdap   Cora Collum, DO [facility]   {    This will disappear when note is signed, click to select method of visit    :1}

## 2021-01-19 ENCOUNTER — Ambulatory Visit: Payer: Medicaid Other | Admitting: Family Medicine

## 2021-01-25 DIAGNOSIS — F339 Major depressive disorder, recurrent, unspecified: Secondary | ICD-10-CM | POA: Diagnosis not present

## 2021-01-26 ENCOUNTER — Ambulatory Visit: Payer: Self-pay

## 2021-01-28 ENCOUNTER — Encounter: Payer: Self-pay | Admitting: Emergency Medicine

## 2021-01-28 ENCOUNTER — Emergency Department (HOSPITAL_COMMUNITY): Payer: Medicaid Other

## 2021-01-28 ENCOUNTER — Encounter (HOSPITAL_COMMUNITY): Payer: Self-pay | Admitting: Emergency Medicine

## 2021-01-28 ENCOUNTER — Observation Stay (HOSPITAL_COMMUNITY)
Admission: EM | Admit: 2021-01-28 | Discharge: 2021-01-29 | Disposition: A | Discharge status: Home / Self Care | Payer: Medicaid Other | Attending: Family Medicine | Admitting: Family Medicine

## 2021-01-28 ENCOUNTER — Other Ambulatory Visit: Payer: Self-pay

## 2021-01-28 ENCOUNTER — Ambulatory Visit (INDEPENDENT_AMBULATORY_CARE_PROVIDER_SITE_OTHER)
Admission: EM | Admit: 2021-01-28 | Discharge: 2021-01-28 | Disposition: A | Discharge status: Home / Self Care | Payer: Medicaid Other | Source: Home / Self Care

## 2021-01-28 DIAGNOSIS — Z91040 Latex allergy status: Secondary | ICD-10-CM | POA: Diagnosis not present

## 2021-01-28 DIAGNOSIS — R110 Nausea: Secondary | ICD-10-CM

## 2021-01-28 DIAGNOSIS — J020 Streptococcal pharyngitis: Secondary | ICD-10-CM | POA: Diagnosis not present

## 2021-01-28 DIAGNOSIS — R059 Cough, unspecified: Secondary | ICD-10-CM | POA: Insufficient documentation

## 2021-01-28 DIAGNOSIS — Y900 Blood alcohol level of less than 20 mg/100 ml: Secondary | ICD-10-CM | POA: Insufficient documentation

## 2021-01-28 DIAGNOSIS — N898 Other specified noninflammatory disorders of vagina: Secondary | ICD-10-CM | POA: Insufficient documentation

## 2021-01-28 DIAGNOSIS — Z3202 Encounter for pregnancy test, result negative: Secondary | ICD-10-CM | POA: Insufficient documentation

## 2021-01-28 DIAGNOSIS — D649 Anemia, unspecified: Secondary | ICD-10-CM | POA: Insufficient documentation

## 2021-01-28 DIAGNOSIS — J029 Acute pharyngitis, unspecified: Secondary | ICD-10-CM | POA: Diagnosis not present

## 2021-01-28 DIAGNOSIS — Z20822 Contact with and (suspected) exposure to covid-19: Secondary | ICD-10-CM | POA: Insufficient documentation

## 2021-01-28 DIAGNOSIS — R569 Unspecified convulsions: Principal | ICD-10-CM | POA: Insufficient documentation

## 2021-01-28 LAB — URINALYSIS, ROUTINE W REFLEX MICROSCOPIC
Bilirubin Urine: NEGATIVE
Glucose, UA: NEGATIVE mg/dL
Hgb urine dipstick: NEGATIVE
Ketones, ur: NEGATIVE mg/dL
Nitrite: NEGATIVE
Protein, ur: NEGATIVE mg/dL
Specific Gravity, Urine: 1.008 (ref 1.005–1.030)
pH: 7 (ref 5.0–8.0)

## 2021-01-28 LAB — CBC WITH DIFFERENTIAL/PLATELET
Abs Immature Granulocytes: 0.01 10*3/uL (ref 0.00–0.07)
Basophils Absolute: 0 10*3/uL (ref 0.0–0.1)
Basophils Relative: 0 %
Eosinophils Absolute: 0.1 10*3/uL (ref 0.0–0.5)
Eosinophils Relative: 1 %
HCT: 36 % (ref 36.0–46.0)
Hemoglobin: 11.4 g/dL — ABNORMAL LOW (ref 12.0–15.0)
Immature Granulocytes: 0 %
Lymphocytes Relative: 29 %
Lymphs Abs: 1.7 10*3/uL (ref 0.7–4.0)
MCH: 29.5 pg (ref 26.0–34.0)
MCHC: 31.7 g/dL (ref 30.0–36.0)
MCV: 93.3 fL (ref 80.0–100.0)
Monocytes Absolute: 0.4 10*3/uL (ref 0.1–1.0)
Monocytes Relative: 7 %
Neutro Abs: 3.8 10*3/uL (ref 1.7–7.7)
Neutrophils Relative %: 63 %
Platelets: 253 10*3/uL (ref 150–400)
RBC: 3.86 MIL/uL — ABNORMAL LOW (ref 3.87–5.11)
RDW: 13.2 % (ref 11.5–15.5)
WBC: 6 10*3/uL (ref 4.0–10.5)
nRBC: 0 % (ref 0.0–0.2)

## 2021-01-28 LAB — RAPID URINE DRUG SCREEN, HOSP PERFORMED
Amphetamines: NOT DETECTED
Barbiturates: NOT DETECTED
Benzodiazepines: NOT DETECTED
Cocaine: NOT DETECTED
Opiates: NOT DETECTED
Tetrahydrocannabinol: NOT DETECTED

## 2021-01-28 LAB — RESP PANEL BY RT-PCR (FLU A&B, COVID) ARPGX2
Influenza A by PCR: NEGATIVE
Influenza B by PCR: NEGATIVE
SARS Coronavirus 2 by RT PCR: NEGATIVE

## 2021-01-28 LAB — BASIC METABOLIC PANEL
Anion gap: 11 (ref 5–15)
BUN: 7 mg/dL (ref 6–20)
CO2: 21 mmol/L — ABNORMAL LOW (ref 22–32)
Calcium: 8.9 mg/dL (ref 8.9–10.3)
Chloride: 108 mmol/L (ref 98–111)
Creatinine, Ser: 0.76 mg/dL (ref 0.44–1.00)
GFR, Estimated: >60 mL/min (ref >60)
Glucose, Bld: 111 mg/dL — ABNORMAL HIGH (ref 70–99)
Potassium: 3.6 mmol/L (ref 3.5–5.1)
Sodium: 140 mmol/L (ref 135–145)

## 2021-01-28 LAB — CBG MONITORING, ED: Glucose-Capillary: 96 mg/dL (ref 70–99)

## 2021-01-28 LAB — GROUP A STREP BY PCR: Group A Strep by PCR: DETECTED — AB

## 2021-01-28 LAB — POCT URINE PREGNANCY: Preg Test, Ur: NEGATIVE

## 2021-01-28 LAB — LACTIC ACID, PLASMA: Lactic Acid, Venous: 5 mmol/L (ref 0.5–1.9)

## 2021-01-28 LAB — VALPROIC ACID LEVEL: Valproic Acid Lvl: <10 ug/mL — ABNORMAL LOW (ref 50.0–100.0)

## 2021-01-28 MED ORDER — ENOXAPARIN SODIUM 40 MG/0.4ML IJ SOSY: 40.0000 mg | PREFILLED_SYRINGE | INTRAMUSCULAR | Status: DC

## 2021-01-28 MED ORDER — SODIUM CHLORIDE 0.9 % IV SOLN: Freq: Once | INTRAVENOUS | Status: AC

## 2021-01-28 MED ORDER — CETIRIZINE HCL 10 MG PO CAPS: 10.0000 mg | ORAL_CAPSULE | Freq: Every day | ORAL | 0 refills | Status: DC

## 2021-01-28 MED ORDER — LORAZEPAM 2 MG/ML IJ SOLN
INTRAMUSCULAR | Status: AC
  Filled 2021-01-28: qty 1

## 2021-01-28 MED ORDER — VALPROATE SODIUM 100 MG/ML IV SOLN
1000.0000 mg | Freq: Once | INTRAVENOUS | Status: DC
  Filled 2021-01-28: qty 10

## 2021-01-28 MED ORDER — POLYETHYLENE GLYCOL 3350 17 G PO PACK: 17.0000 g | PACK | Freq: Every day | ORAL | Status: DC | PRN

## 2021-01-28 MED ORDER — ONDANSETRON 4 MG PO TBDP: 4.0000 mg | ORAL_TABLET | Freq: Three times a day (TID) | ORAL | 0 refills | Status: DC | PRN

## 2021-01-28 MED ORDER — LACTATED RINGERS IV BOLUS: 1000.0000 mL | Freq: Once | INTRAVENOUS | Status: DC

## 2021-01-28 MED ORDER — FOLIC ACID 1 MG PO TABS
1.0000 mg | ORAL_TABLET | Freq: Every day | ORAL | Status: DC
  Administered 2021-01-29: 1 mg via ORAL
  Filled 2021-01-28: qty 1

## 2021-01-28 MED ORDER — LORAZEPAM 2 MG/ML IJ SOLN
2.0000 mg | Freq: Once | INTRAMUSCULAR | Status: AC
  Administered 2021-01-28: 2 mg via INTRAVENOUS

## 2021-01-28 MED ORDER — PENICILLIN G BENZATHINE 1200000 UNIT/2ML IM SUSY
1.2000 10*6.[IU] | PREFILLED_SYRINGE | Freq: Once | INTRAMUSCULAR | Status: AC
  Administered 2021-01-29: 1.2 10*6.[IU] via INTRAMUSCULAR
  Filled 2021-01-28: qty 2

## 2021-01-28 MED ORDER — ACETAMINOPHEN 325 MG PO TABS: 650.0000 mg | ORAL_TABLET | Freq: Four times a day (QID) | ORAL | Status: DC | PRN

## 2021-01-28 MED ORDER — ACETAMINOPHEN 650 MG RE SUPP: 650.0000 mg | Freq: Four times a day (QID) | RECTAL | Status: DC | PRN

## 2021-01-28 MED ORDER — SODIUM CHLORIDE 0.9 % IV SOLN: Freq: Once | INTRAVENOUS | Status: DC

## 2021-01-28 MED ORDER — BENZONATATE 200 MG PO CAPS: 200.0000 mg | ORAL_CAPSULE | Freq: Three times a day (TID) | ORAL | 0 refills | Status: DC

## 2021-01-28 NOTE — Discharge Instructions (Addendum)
Pregnancy test negative, monitor for menstrual cycle to come on over the next week Vaginal swab pending to screen for any vaginal infections Use Zofran dissolved in mouth as needed for nausea, drink plenty of fluids Daily cetirizine to help with any postnasal drainage/allergies Tessalon every 8 hours for cough Tylenol and ibuprofen as needed Follow-up if not improving or worsening
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
## 2021-01-28 NOTE — Progress Notes (Signed)
I briefly visited the patient in the emergency department after our team was called for admission.   Upon entering her room in the emergency department, patient was talking on the phone, did not at all appear postictal. I introduced myself as Dr. Dareen Piano and asked the patient if she recognized me, reminding her that she had a previous appointment at the family practice center for removal of her expired Nexplanon and that I was the physician who did the procedure.  I spoke with her regarding my previous attempts to contact her and her brief conversations in which I called to discuss my concerns that Depakote can lead to issues with a baby, including severe birth defects, illness, or even death.  Because of this concern we would prefer that she be seen by a neurologist to have her medication changed.  I also recommended that she be on a form of birth control (such as Depo shot) until her medication can be changed.  In an attempt to use teach back, I asked the patient if she could restate my concerns for her becoming pregnant while still taking Depakote, to which she said "so, you mean my baby could have seizures because I take Depakote?".  I reiterated my concern for "baby getting very sick or even dying if she takes Depakote while pregnant", and attempted teach back again.  At this point the patient did not appear to want to discuss this matter with me anymore and was distracted by her phone.  I asked if she heard me to which she said "yes, I heard you", but did not provide teach back when prompted. At this point I excused myself, said I hope she feels better soon, and left the room.    Timeline of current concerns: 11/04/2020: Patient seen for removal of expired Nexplanon, Nexplanon was removed.  It was emphasized that because of her seizure disorder and current prescription for Depakote that she avoid pregnancy until she is seen by neurology and has medication changed.  Patient was offered other forms  of contraception which she declined.  11/12/2020: Patient was called on 11/12/2020 in an attempt to follow-up with her has no appointment has been set for her to be seen by [place] again or to be followed up by neurology.  At this point patient was reached via telephone, however ended up hanging up the phone.  Prescription for Sprintec was sent to her pharmacy as well as prescription for folic acid 5 mg daily.  I also sent a MyChart message on 11/12/2020 to reiterate avoiding pregnancy before she is able to be seen by neurology.  However, patient's last user login was September 12, 2020. 11/30/2020: Message sent regarding neurology referral that [facility] has been trying to get a hold of the patient to schedule an appointment, patient asked to call them back and given contact information; however, this message was sent via MyChart and patient has not yet read it. 12/13/2020 (note filed 12/22/2020): I attempted to contact the patient again to schedule her for follow-up regarding her seizure disorder, I contacted patient's emergency contact/grandmother Dia Crawford and explained to her the concerns with the patient taking Depakote and not being on any form of birth control and potential risks of severe deformity or noncompatibility with life related to the fetus.  Ms. Constance Haw stated "schedule the patient for an appointment I will make sure she is there". 12/19/2020: Patient scheduled for follow-up appointment at the family practice center with her PCP Dr. Franchot Erichsen, however  patient no-shows. 12/21/2020: Late entry note, Dr. Idalia Needle called the patient 4/18 due to missing her appointment, patient did not answer the phone.  Dr. Idalia Needle called the emergency contact/patient's grandmother Ms. Dia Crawford who informed the PCP that the patient was not feeling well which is why she did not make the appointment.  The importance of follow-up was emphasized, grandmother stated she would reschedule. 12/26/2020:  CMA from clinic calls and leaves voice message for patient to call the family practice center to make appointment with PCP. 12/28/2020: Contacted patient's grandmother again regarding recently missed appointment, her difficulties in contacting the patient, and that we will not be prescribing her seizure medicine for her if she does not come in for an appointment.  Ms. Constance Haw inform me that the patient would call in the morning to reschedule an appointment. 01/19/2021: Patient scheduled for follow-up appointment at family practice Center with her PCP Dr. Franchot Erichsen, however no-showed again.   Peggyann Shoals, DO [facility], PGY-3 01/28/2021 11:52 PM

## 2021-01-28 NOTE — ED Notes (Signed)
Admitting doctor at  The bedside 

## 2021-01-28 NOTE — ED Provider Notes (Signed)
[facility] EMERGENCY DEPARTMENT Provider Note   CSN: [id number] Arrival date & time: 01/28/21  1937     History Chief Complaint  Patient presents with  . Seizures    [name] is a 20 y.o. female.  HPI 20 year old female with history of seizures presents the emergency department for seizure-like activity.  She was brought back from triage due to concern for seizure-like activity by nursing staff.  Upon my evaluation, was not speaking.  Was responsive to pain, but had core movements that were distractible.  Not currently contributing to history and review of systems.  Per grandma, patient had 6 seizures on the way to the ED.  Was taken to urgent care this morning due to concerns for URI and possible pregnancy.  Pregnancy test was negative at that time.  Patient has not received anything for seizures.  [name] states seizures look like generalized shaking and eyes rolled back into her head.  Similar to previous seizures.  Patient briefly woke up to be able to state that she took her Depakote at 11 AM this morning.  Denies pain anywhere.  Past Medical History:  Diagnosis Date  . Seizure (HCC)   . Seizures (HCC)   . Well child check 04/26/2011    Patient Active Problem List   Diagnosis Date Noted  . Seizure (HCC) 01/28/2021  . Encounter for Nexplanon removal 11/04/2020  . Family history of brain cancer 05/20/2015  . Seizures (HCC) 10/31/2006    Past Surgical History:  Procedure Laterality Date  . LEG SURGERY       OB History   No obstetric history on file.     Family History  Problem Relation Age of Onset  . Healthy Mother   . Seizures Father     Social History   Tobacco Use  . Smoking status: Never Smoker  . Smokeless tobacco: Never Used  Substance Use Topics  . Alcohol use: No  . Drug use: No    Home Medications Prior to Admission medications   Medication Sig Start Date End Date Taking? Authorizing Provider  benzonatate (TESSALON)  200 MG capsule Take 1 capsule (200 mg total) by mouth every 8 (eight) hours. 01/28/21  Yes Wieters, Hallie C, [name]  Cetirizine HCl 10 MG CAPS Take 1 capsule (10 mg total) by mouth daily for 10 days. 01/28/21 02/07/21 Yes Wieters, Hallie C, [name]  divalproex (DEPAKOTE SPRINKLE) 125 MG capsule TAKE 2 CAPSULES BY MOUTH TWICE A DAY Patient taking differently: Take 250 mg by mouth 2 (two) times daily. 05/11/20  Yes Nestor Ramp, MD  ondansetron (ZOFRAN ODT) 4 MG disintegrating tablet Take 1 tablet (4 mg total) by mouth every 8 (eight) hours as needed for nausea or vomiting. 01/28/21  Yes Wieters, Hallie C, [name]  diazepam (DIASAT) 20 MG GEL Place 20 mg rectally once as needed for up to 1 dose. Use in the setting of seizure lasting longer than 5 minutes. Call 9-1-1 promptly after use. Patient not taking: Reported on 01/28/2021 12/15/19   [name], [name]  fluticasone [facility]) 50 MCG/ACT nasal spray Place 1-2 sprays into both nostrils daily. Patient not taking: No sig reported 08/17/20   Wieters, Hallie C, [name]  Folic Acid 5 MG CAPS Take 1 capsule (5 mg total) by mouth daily. Patient not taking: No sig reported 11/12/20   [name], DO  ibuprofen (ADVIL) 800 MG tablet Take 1 tablet (800 mg total) by mouth 3 (three) times daily. Patient not taking: No sig  reported 11/10/20   Wieters, Hallie C, [name]  norgestimate-ethinyl estradiol (SPRINTEC 28) 0.25-35 MG-MCG tablet Take 1 tablet by mouth daily. Patient not taking: No sig reported 11/12/20   [name], DO  loratadine (CLARITIN) 10 MG tablet Take 1 tablet (10 mg total) by mouth daily. 10/15/18 01/28/21  Nestor Ramp, MD    Allergies    Codeine, Latex, and Tdap [tetanus-diphth-acell pertussis]  Review of Systems   Review of Systems  Constitutional: Negative for chills and fever.  HENT: Positive for congestion and sore throat. Negative for ear pain.   Eyes: Negative for pain and visual disturbance.  Respiratory: Positive for cough. Negative for  shortness of breath.   Cardiovascular: Negative for chest pain and palpitations.  Gastrointestinal: Negative for abdominal pain and vomiting.  Genitourinary: Negative for dysuria and hematuria.  Musculoskeletal: Negative for arthralgias and back pain.  Skin: Negative for color change and rash.  Neurological: Positive for seizures. Negative for syncope.  Psychiatric/Behavioral: Negative for sleep disturbance.  All other systems reviewed and are negative.   Physical Exam Updated Vital Signs BP (!) 130/97   Pulse (!) 101   Temp 99.8 F (37.7 C) (Rectal)   Resp 16   Wt 49.9 kg   LMP 12/31/2020   SpO2 100%   BMI 19.49 kg/m   Physical Exam Vitals and nursing note reviewed.  Constitutional:      General: She is not in acute distress.    Appearance: She is well-developed.     Comments: Eyes rolled back in head.  Making truncal movement with loose extremities.  HENT:     Head: Normocephalic and atraumatic.     Left Ear: External ear normal.  Eyes:     Conjunctiva/sclera: Conjunctivae normal.  Cardiovascular:     Rate and Rhythm: Regular rhythm. Tachycardia present.     Heart sounds: No murmur heard.   Pulmonary:     Effort: Pulmonary effort is normal. No respiratory distress.     Breath sounds: Normal breath sounds.  Abdominal:     Palpations: Abdomen is soft.     Tenderness: There is no guarding.  Musculoskeletal:     Cervical back: Neck supple.     Right lower leg: No edema.     Left lower leg: No edema.  Skin:    General: Skin is warm and dry.     Capillary Refill: Capillary refill takes less than 2 seconds.  Neurological:     Comments: Not interacting with neurologic exam.  Making truncal movements, very loose upper and lower extremities.  Does move all extremities spontaneously.  Movements are distractible with painful stimuli.  Psychiatric:     Comments: Deferred     ED Results / Procedures / Treatments   Labs (all labs ordered are listed, but only abnormal  results are displayed) Labs Reviewed  GROUP A STREP BY PCR - Abnormal; Notable for the following components:      Result Value   Group A Strep by PCR DETECTED (*)    All other components within normal limits  CBC WITH DIFFERENTIAL/PLATELET - Abnormal; Notable for the following components:   RBC 3.86 (*)    Hemoglobin 11.4 (*)    All other components within normal limits  BASIC METABOLIC PANEL - Abnormal; Notable for the following components:   CO2 21 (*)    Glucose, Bld 111 (*)    All other components within normal limits  VALPROIC ACID LEVEL - Abnormal; Notable for the following components:  Valproic Acid Lvl <10 (*)    All other components within normal limits  URINALYSIS, ROUTINE W REFLEX MICROSCOPIC - Abnormal; Notable for the following components:   APPearance HAZY (*)    Leukocytes,Ua SMALL (*)    Bacteria, UA RARE (*)    All other components within normal limits  LACTIC ACID, PLASMA - Abnormal; Notable for the following components:   Lactic Acid, Venous 5.0 (*)    All other components within normal limits  RESP PANEL BY RT-PCR (FLU A&B, COVID) ARPGX2  RAPID URINE DRUG SCREEN, HOSP PERFORMED  ETHANOL  HIV ANTIBODY (ROUTINE TESTING W REFLEX)  BASIC METABOLIC PANEL  CBC  CBG MONITORING, ED    EKG None  Radiology DG Chest Portable 1 View  Result Date: 01/28/2021 CLINICAL DATA:  Cough EXAM: PORTABLE CHEST 1 VIEW COMPARISON:  May 28, 2020 FINDINGS: The cardiomediastinal silhouette is normal in contour. No pleural effusion. No pneumothorax. No acute pleuroparenchymal abnormality. Visualized abdomen is unremarkable. No acute osseous abnormality noted. IMPRESSION: No acute cardiopulmonary abnormality. Electronically Signed   By: Meda KlinefelterStephanie  Peacock MD   On: 01/28/2021 20:13    Procedures Procedures   Medications Ordered in ED Medications  valproate (DEPACON) 1,000 mg in dextrose 5 % 50 mL IVPB (has no administration in time range)  penicillin g benzathine  (BICILLIN LA) 1200000 UNIT/2ML injection 1.2 Million Units (has no administration in time range)  enoxaparin (LOVENOX) injection 40 mg (has no administration in time range)  acetaminophen (TYLENOL) tablet 650 mg (has no administration in time range)    Or  acetaminophen (TYLENOL) suppository 650 mg (has no administration in time range)  polyethylene glycol (MIRALAX / GLYCOLAX) packet 17 g (has no administration in time range)  folic acid (FOLVITE) tablet 1 mg (has no administration in time range)  LORazepam (ATIVAN) injection 2 mg (2 mg Intravenous Given 01/28/21 1952)  0.9 %  sodium chloride infusion (0 mLs Intravenous Stopped 01/28/21 2222)    ED Course  I have reviewed the triage vital signs and the nursing notes.  Pertinent labs & imaging results that were available during my care of the patient were reviewed by me and considered in my medical decision making (see chart for details).    MDM Rules/Calculators/A&P                          20 year old female presents with seizure-like activity.  Vital signs are stable, though she is tachycardic.  Satting 100% upon evaluation.  Looks to be breathing easily despite possible seizure activity.  Held off on giving Ativan at first as she was responding to pain and able to hold her arm up in the air above her face despite possible seizure activity.  She quickly stopped seizure activity.  Blood glucose adequate.  Labs drawn to further stratify patient.  I was called and stated the patient had return seizure activity and had been given 2 mg of IV Ativan.  As I walked in the room about 1 minute after the Ativan, patient was alert.  Able to answer questions.  Did not appear to have postictal period.  States she did take her Depakote at 11 AM this morning.  Does endorse sore throat.  Upon review of chart, she did have a negative pregnancy test this morning.  Do not see COVID or strep test, these were added.  CBC with mild anemia, the last value was over a  year ago.  Anabolic acidosis with lactic acid  elevation on BMP and labs.  Depakote level is undetectable.  UTI.  Strep screen is positive.  COVID is negative.  Discussed findings with patient, upon reevaluation she is still alert and seizure-free.  I spoke to neurology regarding patient, they recommend admission due to number of seizures reported.  They did review her history, she seems to be on a very low dose of Depakote.  Additionally, chart review does show that she has been trying to get pregnant and this is likely why she has not been taking her Depakote.  I also looked at the chart, do not see her ever seeing a neurologist, Depakote is filled by her family medicine practitioners.  Did have 1 nonepileptic EEG many years ago.  Would benefit from inpatient monitoring and EEG evaluation of episodes.  Patient feels comfortable with this plan.  Patient admitted to family medicine for further monitoring and treatment of seizure-like activity.  Final Clinical Impression(s) / ED Diagnoses Final diagnoses:  Seizure-like activity [facility])    Rx / DC Orders ED Discharge Orders    None       Louretta Parma, DO 01/28/21 2335    Sabino Donovan, MD 01/29/21 [phone]

## 2021-01-28 NOTE — ED Notes (Signed)
Pt not wanting to talk

## 2021-01-28 NOTE — ED Triage Notes (Signed)
Pt sts cough x 3 weeks with some nausea and requesting pregnancy test; pt sts LMP was 4/30

## 2021-01-28 NOTE — ED Provider Notes (Signed)
[name]    CSN: [id number] Arrival date & time: 01/28/21  0802      History   Chief Complaint Chief Complaint  Patient presents with  . Cough  . Possible Pregnancy    HPI [name] is a 20 y.o. female presenting today for evaluation of URI symptoms and pregnancy test.  Patient reports that she has had cough, congestion and sore throat as well as associated nausea.  Her main concern is surrounding the pregnancy test.  Reports unprotected intercourse over the past 2 weeks with her significant other.  She reports associated nausea over this time.  As well as various cravings of food.  She has had some abnormal discharge.  Denies itching or irritation.  Denies urinary symptoms.  Denies abdominal pain.  He reports a slight cough over the past 3 weeks, not taking thing for the symptoms.  Has had chest discomfort with coughing, but declines at rest.  Mild congestion and drainage.  Denies fevers.  HPI  Past Medical History:  Diagnosis Date  . Seizure (HCC)   . Seizures (HCC)   . Well child check 04/26/2011    Patient Active Problem List   Diagnosis Date Noted  . Encounter for Nexplanon removal 11/04/2020  . Family history of brain cancer 05/20/2015  . Seizures (HCC) 10/31/2006    Past Surgical History:  Procedure Laterality Date  . LEG SURGERY      OB History   No obstetric history on file.      Home Medications    Prior to Admission medications   Medication Sig Start Date End Date Taking? Authorizing Provider  benzonatate (TESSALON) 200 MG capsule Take 1 capsule (200 mg total) by mouth every 8 (eight) hours. 01/28/21  Yes Khadejah Son C, [name]  Cetirizine HCl 10 MG CAPS Take 1 capsule (10 mg total) by mouth daily for 10 days. 01/28/21 02/07/21 Yes Allisyn Kunz C, [name]  ondansetron (ZOFRAN ODT) 4 MG disintegrating tablet Take 1 tablet (4 mg total) by mouth every 8 (eight) hours as needed for nausea or vomiting. 01/28/21  Yes Ellean Firman C, [name]   diazepam (DIASAT) 20 MG GEL Place 20 mg rectally once as needed for up to 1 dose. Use in the setting of seizure lasting longer than 5 minutes. Call 9-1-1 promptly after use. 12/15/19   [name], [name]  divalproex (DEPAKOTE SPRINKLE) 125 MG capsule TAKE 2 CAPSULES BY MOUTH TWICE A DAY 05/11/20   Nestor Ramp, MD  fluticasone [facility]) 50 MCG/ACT nasal spray Place 1-2 sprays into both nostrils daily. 08/17/20   Daysen Gundrum C, [name]  Folic Acid 5 MG CAPS Take 1 capsule (5 mg total) by mouth daily. 11/12/20   Dollene Cleveland, DO  ibuprofen (ADVIL) 800 MG tablet Take 1 tablet (800 mg total) by mouth 3 (three) times daily. 11/10/20   Ilze Roselli C, [name]  norgestimate-ethinyl estradiol (SPRINTEC 28) 0.25-35 MG-MCG tablet Take 1 tablet by mouth daily. 11/12/20   Dollene Cleveland, DO  loratadine (CLARITIN) 10 MG tablet Take 1 tablet (10 mg total) by mouth daily. 10/15/18 01/28/21  Nestor Ramp, MD    Family History Family History  Problem Relation Age of Onset  . Healthy Mother   . Seizures Father     Social History Social History   Tobacco Use  . Smoking status: Never Smoker  . Smokeless tobacco: Never Used  Substance Use Topics  . Alcohol use: No  . Drug use: No  Allergies   Codeine, Latex, and Tdap [tetanus-diphth-acell pertussis]   Review of Systems Review of Systems  Constitutional: Negative for activity change, appetite change, chills, fatigue and fever.  HENT: Positive for congestion, rhinorrhea and sore throat. Negative for ear pain, sinus pressure and trouble swallowing.   Eyes: Negative for discharge and redness.  Respiratory: Positive for cough. Negative for chest tightness and shortness of breath.   Cardiovascular: Negative for chest pain.  Gastrointestinal: Negative for abdominal pain, diarrhea, nausea and vomiting.  Musculoskeletal: Negative for myalgias.  Skin: Negative for rash.  Neurological: Negative for dizziness, light-headedness and headaches.      Physical Exam Triage Vital Signs ED Triage Vitals  Enc Vitals Group     BP      Pulse      Resp      Temp      Temp src      SpO2      Weight      Height      Head Circumference      Peak Flow      Pain Score      Pain Loc      Pain Edu?      Excl. in GC?    No data found.  Updated Vital Signs BP 122/82 (BP Location: Left Arm)   Pulse 70   Temp 98.3 F (36.8 C) (Oral)   Resp 18   LMP 12/31/2020   SpO2 98%   Visual Acuity Right Eye Distance:   Left Eye Distance:   Bilateral Distance:    Right Eye Near:   Left Eye Near:    Bilateral Near:     Physical Exam Vitals and nursing note reviewed.  Constitutional:      Appearance: She is well-developed.     Comments: No acute distress  HENT:     Head: Normocephalic and atraumatic.     Ears:     Comments: Bilateral ears without tenderness to palpation of external auricle, tragus and mastoid, EAC's without erythema or swelling, TM's with good bony landmarks and cone of light. Non erythematous.     Nose: Nose normal.     Mouth/Throat:     Comments: Oral mucosa pink and moist, no tonsillar enlargement or exudate. Posterior pharynx patent and nonerythematous, no uvula deviation or swelling. Normal phonation. Eyes:     Conjunctiva/sclera: Conjunctivae normal.  Cardiovascular:     Rate and Rhythm: Normal rate and regular rhythm.  Pulmonary:     Effort: Pulmonary effort is normal. No respiratory distress.     Comments: Breathing comfortably at rest, CTABL, no wheezing, rales or other adventitious sounds auscultated Abdominal:     General: There is no distension.  Musculoskeletal:        General: Normal range of motion.     Cervical back: Neck supple.  Skin:    General: Skin is warm and dry.  Neurological:     Mental Status: She is alert and oriented to person, place, and time.      UC Treatments / Results  Labs (all labs ordered are listed, but only abnormal results are displayed) Labs Reviewed  POCT  URINE PREGNANCY  CERVICOVAGINAL ANCILLARY ONLY    EKG   Radiology No results found.  Procedures Procedures (including critical care time)  Medications Ordered in UC Medications - No data to display  Initial Impression / Assessment and Plan / UC Course  I have reviewed the triage vital signs and the nursing notes.  Pertinent  labs & imaging results that were available during my care of the patient were reviewed by me and considered in my medical decision making (see chart for details).     1.  Pregnancy test negative-monitor for onset of normal menstrual cycle over the next week.  Zofran as needed for nausea, will screen for any vaginal infections causing discharge  2.  Cough- exam reassuring, vital signs stable, lungs clear to auscultation, provided Tessalon for cough, initiating on daily cetirizine help with any postnasal drainage.  Discussed strict return precautions. Patient verbalized understanding and is agreeable with plan.  Final Clinical Impressions(s) / UC Diagnoses   Final diagnoses:  Encounter for pregnancy test, result negative  Cough  Vaginal discharge  Nausea without vomiting     Discharge Instructions     Pregnancy test negative, monitor for menstrual cycle to come on over the next week Vaginal swab pending to screen for any vaginal infections Use Zofran dissolved in mouth as needed for nausea, drink plenty of fluids Daily cetirizine to help with any postnasal drainage/allergies Tessalon every 8 hours for cough Tylenol and ibuprofen as needed Follow-up if not improving or worsening    ED Prescriptions    Medication Sig Dispense Auth. Provider   ondansetron (ZOFRAN ODT) 4 MG disintegrating tablet Take 1 tablet (4 mg total) by mouth every 8 (eight) hours as needed for nausea or vomiting. 20 tablet Gabreille Dardis C, [name]   benzonatate (TESSALON) 200 MG capsule Take 1 capsule (200 mg total) by mouth every 8 (eight) hours. 16 capsule Daishon Chui C,  [name]   Cetirizine HCl 10 MG CAPS Take 1 capsule (10 mg total) by mouth daily for 10 days. 10 capsule Aianna Fahs, [place] C, [name]     PDMP not reviewed this encounter.   Lew Dawes, [place] 01/28/21 [id number]

## 2021-01-28 NOTE — ED Triage Notes (Signed)
Patient seen early today at [facility] for viral syndrome.  Patient from waiting room, mom stated she had 6 seizures on the way to ED.  Patient actively seizing on arrival.

## 2021-01-29 DIAGNOSIS — Z9114 Patient's other noncompliance with medication regimen: Secondary | ICD-10-CM

## 2021-01-29 DIAGNOSIS — R569 Unspecified convulsions: Secondary | ICD-10-CM | POA: Diagnosis not present

## 2021-01-29 LAB — CBC
HCT: 31.9 % — ABNORMAL LOW (ref 36.0–46.0)
Hemoglobin: 10.2 g/dL — ABNORMAL LOW (ref 12.0–15.0)
MCH: 29.5 pg (ref 26.0–34.0)
MCHC: 32 g/dL (ref 30.0–36.0)
MCV: 92.2 fL (ref 80.0–100.0)
Platelets: 252 10*3/uL (ref 150–400)
RBC: 3.46 MIL/uL — ABNORMAL LOW (ref 3.87–5.11)
RDW: 13.2 % (ref 11.5–15.5)
WBC: 5.3 10*3/uL (ref 4.0–10.5)
nRBC: 0 % (ref 0.0–0.2)

## 2021-01-29 LAB — BASIC METABOLIC PANEL
Anion gap: 6 (ref 5–15)
BUN: <5 mg/dL — ABNORMAL LOW (ref 6–20)
CO2: 22 mmol/L (ref 22–32)
Calcium: 8.3 mg/dL — ABNORMAL LOW (ref 8.9–10.3)
Chloride: 109 mmol/L (ref 98–111)
Creatinine, Ser: 0.56 mg/dL (ref 0.44–1.00)
GFR, Estimated: >60 mL/min (ref >60)
Glucose, Bld: 103 mg/dL — ABNORMAL HIGH (ref 70–99)
Potassium: 3.5 mmol/L (ref 3.5–5.1)
Sodium: 137 mmol/L (ref 135–145)

## 2021-01-29 LAB — ETHANOL: Alcohol, Ethyl (B): <10 mg/dL (ref <10)

## 2021-01-29 LAB — HIV ANTIBODY (ROUTINE TESTING W REFLEX): HIV Screen 4th Generation wRfx: NONREACTIVE

## 2021-01-29 MED ORDER — LAMOTRIGINE 25 MG PO TABS: 75.0000 mg | ORAL_TABLET | Freq: Two times a day (BID) | ORAL | Status: DC

## 2021-01-29 MED ORDER — LAMOTRIGINE 100 MG PO TABS: 100.0000 mg | ORAL_TABLET | Freq: Every day | ORAL | Status: DC

## 2021-01-29 MED ORDER — LAMOTRIGINE 25 MG PO TABS: 75.0000 mg | ORAL_TABLET | Freq: Every day | ORAL | Status: DC

## 2021-01-29 MED ORDER — LEVETIRACETAM 500 MG PO TABS: 500.0000 mg | ORAL_TABLET | Freq: Two times a day (BID) | ORAL | 0 refills | Status: DC

## 2021-01-29 MED ORDER — LEVETIRACETAM 500 MG PO TABS
500.0000 mg | ORAL_TABLET | Freq: Two times a day (BID) | ORAL | Status: DC
  Administered 2021-01-29: 500 mg via ORAL
  Filled 2021-01-29: qty 1

## 2021-01-29 MED ORDER — LAMOTRIGINE 25 MG PO TABS: 50.0000 mg | ORAL_TABLET | Freq: Every day | ORAL | Status: DC

## 2021-01-29 MED ORDER — LAMOTRIGINE 25 MG PO TABS: 50.0000 mg | ORAL_TABLET | Freq: Two times a day (BID) | ORAL | 0 refills | Status: DC

## 2021-01-29 MED ORDER — LAMOTRIGINE 25 MG PO TABS
ORAL_TABLET | ORAL | 0 refills | Status: DC
Start: 2021-01-29 — End: 2021-01-29

## 2021-01-29 MED ORDER — LEVETIRACETAM 250 MG PO TABS: 250.0000 mg | ORAL_TABLET | Freq: Two times a day (BID) | ORAL | Status: DC

## 2021-01-29 MED ORDER — LAMOTRIGINE 25 MG PO TABS: 25.0000 mg | ORAL_TABLET | Freq: Every day | ORAL | Status: DC

## 2021-01-29 MED ORDER — LEVETIRACETAM IN NACL 1000 MG/100ML IV SOLN
1000.0000 mg | Freq: Once | INTRAVENOUS | Status: AC
  Administered 2021-01-29: 1000 mg via INTRAVENOUS
  Filled 2021-01-29: qty 100

## 2021-01-29 MED ORDER — LAMOTRIGINE 25 MG PO TABS
25.0000 mg | ORAL_TABLET | Freq: Every day | ORAL | Status: DC
  Administered 2021-01-29: 25 mg via ORAL
  Filled 2021-01-29 (×2): qty 1

## 2021-01-29 MED ORDER — LAMOTRIGINE 25 MG PO TABS: 50.0000 mg | ORAL_TABLET | Freq: Two times a day (BID) | ORAL | Status: DC

## 2021-01-29 MED ORDER — LAMOTRIGINE 25 MG PO TABS: 25.0000 mg | ORAL_TABLET | Freq: Two times a day (BID) | ORAL | Status: DC

## 2021-01-29 MED ORDER — LAMOTRIGINE 100 MG PO TABS: 100.0000 mg | ORAL_TABLET | Freq: Two times a day (BID) | ORAL | Status: DC

## 2021-01-29 MED ORDER — LAMOTRIGINE 25 MG PO TABS: 25.0000 mg | ORAL_TABLET | Freq: Every day | ORAL | 0 refills | Status: DC

## 2021-01-29 MED ORDER — LAMOTRIGINE 25 MG PO TABS: ORAL_TABLET | ORAL | 0 refills | Status: DC
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
## 2021-01-29 NOTE — Discharge Summary (Signed)
Family Medicine Teaching [facility] Discharge Summary  Patient name: [name] Medical record number: [id number] Date of birth: August 25, 2001 Age: 20 y.o. Gender: female Date of Admission: 01/28/2021  Date of Discharge: 01/29/21 Admitting Physician: Sandre Kitty, MD  Primary Care Provider: Cora Collum, DO Consultants: neurology  Indication for Hospitalization: seizures  Discharge Diagnoses/Problem List:  Seizures pharyngitis  Disposition: home  Discharge Condition: improved, stable.   Discharge Exam:  BP 121/63   Pulse 83   Temp 99.8 F (37.7 C) (Rectal)   Resp (!) 23   Wt 49.9 kg   LMP 12/31/2020   SpO2 100%   BMI 19.49 kg/m  General: sleepy but arousable.  Cardiovascular: RRR.  Respiratory: lctab.  Extremities: moving all extremities spontaneously  Brief Hospital Course:  Patient admitted 5/28 for multiple seizures on 5/28. Patient was previously prescribed depakote but had not had refill in several months despite endorsing compliance.  The patient's depakote level was < 10.   She had a witness seizure in the ED and was given ativan.  Neurology consulted in the ED.  Patient given IV valproate.  This was then switched to IV keppra loading dose.  Patient did not have any more seizures while in the hospital.  The next morning she was well enough to discharge.  Neurology recommended keppra outpatient with gradual switch to lamictal.    Patient complained of strep throat. Patient tested positive for group A strep.  Patient was given bicillin in ED.    Issues for Follow Up:  1. Make sure patient has appointment with neurology.   2. Neurology's instructions for transitioning from keppra to lamotrigine listed below.  3. Consider oral swab for STI if pharyngitis persists.   Lamotrigine Morning Evening Keppra  Week 1 25mg   500mg  BID  Week 2 25mg  25mg  500mg  BID  Week 3 50mg  25mg  500mg  BID  Week 4 50mg  50mg  500mg  BID  Week 5 75mg  50mg  500mg  BID  Week 6 75mg  75mg   500mg  BID  Week 7 100mg  75mg  500mg  BID  Week 8 100mg  100mg  Keppra 250mg  BID  Week 9 100mg  100mg  Stop Keppra.     Significant Procedures: none  Significant Labs and Imaging:  Recent Labs  Lab 01/28/21 1954 01/29/21 0500  WBC 6.0 5.3  HGB 11.4* 10.2*  HCT 36.0 31.9*  PLT 253 252   Recent Labs  Lab 01/28/21 1954 01/29/21 0500  NA 140 137  K 3.6 3.5  CL 108 109  CO2 21* 22  GLUCOSE 111* 103*  BUN 7 <5*  CREATININE 0.76 0.56  CALCIUM 8.9 8.3*    Results/Tests Pending at Time of Discharge: none  Discharge Medications:  Allergies as of 01/29/2021      Reactions   Codeine    Latex    Tdap [tetanus-diphth-acell Pertussis]       Medication List    STOP taking these medications   diazepam 20 MG Gel Commonly known as: DIASAT   divalproex 125 MG capsule Commonly known as: DEPAKOTE SPRINKLE   norgestimate-ethinyl estradiol 0.25-35 MG-MCG tablet Commonly known as: Sprintec 28     TAKE these medications   benzonatate 200 MG capsule Commonly known as: TESSALON Take 1 capsule (200 mg total) by mouth every 8 (eight) hours.   Cetirizine HCl 10 MG Caps Take 1 capsule (10 mg total) by mouth daily for 10 days.   fluticasone 50 MCG/ACT nasal spray Commonly known as: FLONASE Place 1-2 sprays into both nostrils daily.   Folic Acid 5  MG Caps Take 1 capsule (5 mg total) by mouth daily.   ibuprofen 800 MG tablet Commonly known as: ADVIL Take 1 tablet (800 mg total) by mouth 3 (three) times daily.   lamoTRIgine 25 MG tablet Commonly known as: LAMICTAL Take 1 tablet (25 mg total) by mouth daily for 7 days, THEN 1 tablet (25 mg total) 2 (two) times daily for 7 days. Start taking on: Jan 30, 2021   lamoTRIgine 25 MG tablet Commonly known as: LAMICTAL Take 2 tablets (50 mg total) by mouth daily AND 1 tablet (25 mg total) at bedtime. Start taking on: February 13, 2021   lamoTRIgine 25 MG tablet Commonly known as: LAMICTAL Take 2 tablets (50 mg total) by mouth 2 (two)  times daily. Start taking on: February 19, 2021   levETIRAcetam 500 MG tablet Commonly known as: KEPPRA Take 1 tablet (500 mg total) by mouth 2 (two) times daily.   ondansetron 4 MG disintegrating tablet Commonly known as: Zofran ODT Take 1 tablet (4 mg total) by mouth every 8 (eight) hours as needed for nausea or vomiting.       Discharge Instructions: Please refer to Patient Instructions section of EMR for full details.  Patient was counseled important signs and symptoms that should prompt return to medical care, changes in medications, dietary instructions, activity restrictions, and follow up appointments.   Follow-Up Appointments:  Follow-up Information    [facility] Follow up.   Why: please call to schedule an appointment Contact information: [address]     [address] [place] [address] [phone]       Cora Collum, DO Follow up.   Specialty: Family Medicine Contact information: [address] [place] [address] [phone]               Sandre Kitty, MD 01/29/2021, 7:23 PM PGY-3, [facility] Family Medicine

## 2021-01-29 NOTE — Consult Note (Addendum)
NEUROLOGY CONSULTATION NOTE   Date of service: Jan 29, 2021 Patient Name: [name] MRN:  [id number] DOB:  05/13/2001 Reason for consult: "Seizures" Requesting Provider: Westley ChandlerBrown, Carina M, MD _ _ _   _ __   _ __ _ _  __ __   _ __   __ _  History of Present Illness  [name] is a 20 y.o. female with PMH significant for childhood epilepsy(see details from pediatric neurology consult note on 01/16/11) on Depakote and concern for non compliance who was brought in by EMS for seizures. Per notes, about 6 seizures today. Was given ativan 2mg , no post ictal period per notes from ED.  Depakote levels were essentially undetectable.  On my evaluation she is in deep sleep. Wakes up to squirting saline in her eyes. Does provide some history but does not remember much details about seizures today. Reports skipping Depakote due to poor memory.  Denies any fever, did go to Urgent care for possible sore throat and pregnancy test.  Does endorse that she would like to become pregnant at some point in the future.   ROS   Constitutional Denies weight loss, fever and chills.  HEENT Denies changes in vision and hearing.  Respiratory Denies SOB and cough.  CV Denies palpitations and CP   GI Denies abdominal pain, nausea, vomiting and diarrhea.   GU Denies dysuria and urinary frequency.   MSK Denies myalgia and joint pain.   Skin Denies rash and pruritus.   Neurological Denies headache and syncope.   Psychiatric Denies recent changes in mood. Denies anxiety and depression.    Past History   Past Medical History:  Diagnosis Date  . Seizure (HCC)   . Seizures (HCC)   . Well child check 04/26/2011   Past Surgical History:  Procedure Laterality Date  . LEG SURGERY     Family History  Problem Relation Age of Onset  . Healthy Mother   . Seizures Father    Social History   Socioeconomic History  . Marital status: Single    Spouse name: Not on file  . Number of children: Not on file  . Years  of education: Not on file  . Highest education level: Not on file  Occupational History  . Not on file  Tobacco Use  . Smoking status: Never Smoker  . Smokeless tobacco: Never Used  Substance and Sexual Activity  . Alcohol use: No  . Drug use: No  . Sexual activity: Not on file    Comment: states implant has expired   Other Topics Concern  . Not on file  Social History Narrative  . Not on file   Social Determinants of Health   Financial Resource Strain: Not on file  Food Insecurity: Not on file  Transportation Needs: Not on file  Physical Activity: Not on file  Stress: Not on file  Social Connections: Not on file   Allergies  Allergen Reactions  . Codeine   . Latex   . Tdap [Tetanus-Diphth-Acell Pertussis]     Medications  (Not in a hospital admission)    Vitals   Vitals:   01/28/21 2215 01/28/21 2230 01/28/21 2245 01/28/21 2300  BP: 106/86 133/80 (!) 112/94 (!) 130/97  Pulse: 89 92 84 (!) 101  Resp: (!) 24 17 16 16   Temp:      TempSrc:      SpO2: 100% 100% 100% 100%  Weight:         Body mass index is  19.49 kg/m.  Physical Exam   General: Laying comfortably in bed; in no acute distress.  HENT: Normal oropharynx and mucosa. Normal external appearance of ears and nose.  Neck: Supple, no pain or tenderness  CV: No JVD. No peripheral edema.  Pulmonary: Symmetric Chest rise. Normal respiratory effort.  Abdomen: Soft to touch, non-tender.  Ext: No cyanosis, edema, or deformity  Skin: No rash. Normal palpation of skin.   Musculoskeletal: Normal digits and nails by inspection. No clubbing.   Neurologic Examination  Mental status/Cognition: eyes closed, no response to loud voice or clap. Wakes up to opening her eyes and to noxious stimuli. Oriented to self, place, month and year, good attention.  Speech/language: Fluent, comprehension intact, object naming intact, repetition intact. Cranial nerves:   CN II Pupils equal and reactive to light, no VF deficits    CN III,IV,VI EOM intact, no gaze preference or deviation, no nystagmus   CN V normal sensation in V1, V2, and V3 segments bilaterally   CN VII no asymmetry, no nasolabial fold flattening   CN VIII normal hearing to speech   CN IX & X normal palatal elevation, no uvular deviation   CN XI 5/5 head turn and 5/5 shoulder shrug bilaterally   CN XII midline tongue protrusion   Motor:  Muscle bulk: normal, tone normal, pronator drift none tremor none Mvmt Root Nerve  Muscle Right Left Comments  SA C5/6 Ax Deltoid     EF C5/6 Mc Biceps 5 5   EE C6/7/8 Rad Triceps 5 5   WF C6/7 Med FCR     WE C7/8 PIN ECU     F Ab C8/T1 U ADM/FDI 5 5   HF L1/2/3 Fem Illopsoas 5 5   KE L2/3/4 Fem Quad     DF L4/5 D Peron Tib Ant 5 5   PF S1/2 Tibial Grc/Sol 5 5    Reflexes:  Right Left Comments  Pectoralis      Biceps (C5/6) 1 1   Brachioradialis (C5/6) 2 2    Triceps (C6/7) 1 1    Patellar (L3/4) 1 1    Achilles (S1)      Hoffman      Plantar     Jaw jerk    Sensation:  Light touch Intact throughout   Pin prick    Temperature    Vibration   Proprioception    Coordination/Complex Motor:  - Finger to Nose intact BL - Heel to shin unable to get her to do it due to somnolence. - Rapid alternating movement are slowed - Gait: Deferred.  Labs   CBC:  Recent Labs  Lab 01/28/21 1954  WBC 6.0  NEUTROABS 3.8  HGB 11.4*  HCT 36.0  MCV 93.3  PLT 253    Basic Metabolic Panel:  Lab Results  Component Value Date   NA 140 01/28/2021   K 3.6 01/28/2021   CO2 21 (L) 01/28/2021   GLUCOSE 111 (H) 01/28/2021   BUN 7 01/28/2021   CREATININE 0.76 01/28/2021   CALCIUM 8.9 01/28/2021   GFRNONAA >60 01/28/2021   GFRAA >60 11/21/2018   Lipid Panel: No results found for: LDLCALC HgbA1c: No results found for: HGBA1C Urine Drug Screen:     Component Value Date/Time   LABOPIA NONE DETECTED 01/28/2021 1954   COCAINSCRNUR NONE DETECTED 01/28/2021 1954   LABBENZ NONE DETECTED 01/28/2021 1954    AMPHETMU NONE DETECTED 01/28/2021 1954   THCU NONE DETECTED 01/28/2021 1954   LABBARB NONE DETECTED  01/28/2021 1954    Alcohol Level     Component Value Date/Time   ETH <10 01/29/2021 0017    CT Head without contrast: Reviewed prior [facility] from march 2020 with no acute intracranial abnormality.  rEEG: (per report from 01/16/11) In sedated sleep, both stage II and stage III, this is a  borderline record.  No seizure activity was seen and no focality was seen in  the background.  This appears to be consistent with a postictal state and  with benzodiazepine medications used to bring about seizure control.  Impression   Jaymes Depaoli is a 20 y.o. female with PMH significant for childhood epilepsy and history of status epilepticus(see details from pediatric neurology consult note on 01/16/11) on Depakote and concern for non compliance who was brought in by EMS for seizures. Her neurologic examination is notable for no focal deficit, somnolent but wakes up to answer questions and follow commands.  Reports she forgets Depakote, discussed with her and she will set up alarms to help her take medications in the future.  Discussed with patient and will stop Depakote and start her on Keppra with eventual plan to transition to Lamictal. I discussed risks and benefits of Keppra and Lamictal. Also discussed risk of SUDEP and risks of dementia and further memory issues with continued seizures.  Impression: Breakthrough seizure Medication non compliance.  Recommendations  - Stop Depakote - Observe overnight to monitor for seizure clustering. - Referral to Neurology. Would like to establish with someone. - Load Keppra 1G IV once  Lamotrigine Morning Evening Keppra  Week 1 25mg   500mg  BID  Week 2 25mg  25mg  500mg  BID  Week 3 50mg  25mg  500mg  BID  Week 4 50mg  50mg  500mg  BID  Week 5 75mg  50mg  500mg  BID  Week 6 75mg  75mg  500mg  BID  Week 7 100mg  75mg  500mg  BID  Week 8 100mg  100mg  Keppra 250mg  BID   Week 9 100mg  100mg  Stop Keppra.   Seizure precautions: Per [facility] statutes, patients with seizures are not allowed to drive until they have been seizure-free for six months and cleared by a physician    Use caution when using heavy equipment or power tools. Avoid working on ladders or at heights. Take showers instead of baths. Ensure the water temperature is not too high on the home water heater. Do not go swimming alone. Do not lock yourself in a room alone (i.e. bathroom). When caring for infants or small children, sit down when holding, feeding, or changing them to minimize risk of injury to the child in the event you have a seizure. Maintain good sleep hygiene. Avoid alcohol.    If patient has another seizure, call 911 and bring them back to the ED if: A.  The seizure lasts longer than 5 minutes.      B.  The patient doesn't wake shortly after the seizure or has new problems such as difficulty seeing, speaking or moving following the seizure C.  The patient was injured during the seizure D.  The patient has a temperature over 102 F (39C) E.  The patient vomited during the seizure and now is having trouble breathing    During the Seizure   - First, ensure adequate ventilation and place patients on the floor on their left side  Loosen clothing around the neck and ensure the airway is patent. If the patient is clenching the teeth, do not force the mouth open with any object as this can cause severe damage - Remove  all items from the surrounding that can be hazardous. The patient may be oblivious to what's happening and may not even know what he or she is doing. If the patient is confused and wandering, either gently guide him/her away and block access to outside areas - Reassure the individual and be comforting - Call 911. In most cases, the seizure ends before EMS arrives. However, there are cases when seizures may last over 3 to 5 minutes. Or the individual may have developed  breathing difficulties or severe injuries. If a pregnant patient or a person with diabetes develops a seizure, it is prudent to call an ambulance. - Finally, if the patient does not regain full consciousness, then call EMS. Most patients will remain confused for about 45 to 90 minutes after a seizure, so you must use judgment in calling for help. - Avoid restraints but make sure the patient is in a bed with padded side rails - Place the individual in a lateral position with the neck slightly flexed; this will help the saliva drain from the mouth and prevent the tongue from falling backward - Remove all nearby furniture and other hazards from the area - Provide verbal assurance as the individual is regaining consciousness - Provide the patient with privacy if possible - Call for help and start treatment as ordered by the caregiver    After the Seizure (Postictal Stage)   After a seizure, most patients experience confusion, fatigue, muscle pain and/or a headache. Thus, one should permit the individual to sleep. For the next few days, reassurance is essential. Being calm and helping reorient the person is also of importance.   Most seizures are painless and end spontaneously. Seizures are not harmful to others but can lead to complications such as stress on the lungs, brain and the heart. Individuals with prior lung problems may develop labored breathing and respiratory distress.  ______________________________________________________________________  Plan to be discussed with Dr. Rebbeca Paul overnight.  Thank you for the opportunity to take part in the care of this patient. If you have any further questions, please contact the neurology consultation attending.  Signed,  Erick Blinks [facility] Pager Number [phone] _ _ _   _ __   _ __ _ _  __ __   _ __   __ _
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
## 2021-01-29 NOTE — Plan of Care (Signed)

## 2021-01-29 NOTE — Progress Notes (Signed)
Family Medicine Teaching Service Daily Progress Note Intern Pager: [phone]  Patient name: [name] Medical record number: [id number] Date of birth: Dec 23, 2000 Age: 20 y.o. Gender: female  Primary Care Provider: Cora Collum, DO Consultants: neurology Code Status: full  Pt Overview and Major Events to Date:  5/28 - admitted  Assessment and Plan: [name] is a 20 y.o. female presenting with seizures  Seizure - no episodes overnight. This morning patient sleepy but able to answer questions 'yes or no'. Moving all extremities spontaneously. Neurology notes recommending switching to keppra with eventual switch to lamictal.  - neurology consulted, appreciate recs - ativan prn for seizure -  Valproate stopped.   - keppra started  - loading dose given - f/u outpatient with neuro  Lamotrigine Morning Evening Keppra  Week 1 25mg   500mg  BID  Week 2 25mg  25mg  500mg  BID  Week 3 50mg  25mg  500mg  BID  Week 4 50mg  50mg  500mg  BID  Week 5 75mg  50mg  500mg  BID  Week 6 75mg  75mg  500mg  BID  Week 7 100mg  75mg  500mg  BID  Week 8 100mg  100mg  Keppra 250mg  BID  Week 9 100mg  100mg  Stop Keppra.    Strep pharyngitis - sore throat for two weeks. Strep positive with negative value 8 months ago.  Pt too sleepy this morning to engage in conversation about swabbing for additional causes of pharyngitis including STIs.  - continue to monitor -  If sore throat not resolved at f/u consider swabbing for gonorrhea and chlamydia.   Sexually active not on contraception - encourage pt to take her home folic acid 5mg  given her increased risk for neural tube defects.   FEN/GI: regular diet PPx: lovenox  Disposition: home  Subjective:  Pt states says 'no' when asked if anything is bothering her or if she has any questions.  Otherwise not conversive.    Objective: Temp:  [98.3 F (36.8 C)-99.8 F (37.7 C)] 99.8 F (37.7 C) (05/28 2001) Pulse Rate:  [70-121] 76 (05/29 0400) Resp:  [14-25] 16  (05/29 0400) BP: (93-137)/(54-97) 93/54 (05/29 0400) SpO2:  [98 %-100 %] 99 % (05/29 0400) Weight:  [49.9 kg] 49.9 kg (05/28 1900) Physical Exam: General: sleepy but arousable.  Cardiovascular: RRR.  Respiratory: lctab.  Extremities: moving all extremities spontaneously  Laboratory: Recent Labs  Lab 01/28/21 1954 01/29/21 0500  WBC 6.0 5.3  HGB 11.4* 10.2*  HCT 36.0 31.9*  PLT 253 252   Recent Labs  Lab 01/28/21 1954  NA 140  K 3.6  CL 108  CO2 21*  BUN 7  CREATININE 0.76  CALCIUM 8.9  GLUCOSE 111*      Imaging/Diagnostic Tests: none  , MD 01/29/2021, 5:42 AM PGY-3, [facility] Family Medicine FPTS Intern pager: [phone], text pages welcome

## 2021-01-29 NOTE — ED Notes (Signed)
RN attempted report x2 

## 2021-01-29 NOTE — Progress Notes (Signed)
With patient's permission, called and spoke with patient's grandmother about discharge today. Grandmother agreeable to plan, will watch patient at her home next few days.  Terisa Starr, MD  Family Medicine Teaching Service

## 2021-01-29 NOTE — ED Notes (Signed)
RN attempted report x1.  

## 2021-01-29 NOTE — Discharge Instructions (Signed)
Lamotrigine Morning Evening Keppra  Week 1 25mg   500mg  BID  Week 2 25mg  25mg  500mg  BID  Week 3 50mg  25mg  500mg  BID  Week 4 50mg  50mg  500mg  BID  Week 5 75mg  50mg  500mg  BID  Week 6 75mg  75mg  500mg  BID  Week 7 100mg  75mg  500mg  BID  Week 8 100mg  100mg  Keppra 250mg  BID  Week 9  100mg  100mg  Stop Keppra.   Dear [name],  You are prescribed with medications to help with your seizures. Please see the table above.  --Please start Lamictal 25 mg oral once in morning on 5/30-6/5 and Keppra 500 mg two times. --And, then follow the table for further dates and medications dosing and number of times every day.  For eg- On 6/6-6/12 You will take Lamictal 25 mg two times daily and Keppra two times daily as well           - On 6/13-6/19 You will take Lamictal 50 mg once in the morning , 25 mg in evening and Keppra 500 mg two times daily          - On 6/20-6/26 You will take Lamictal 50 mg two times daily and Keppra 500 mg two times daily          - On 6/27-7/3 You will take Lamictal 75 mg in morning and 50 mg in evening and Keppra 500 mg two times daily and so on -- Please follow up with neurology outpatient by making appointment  -- If you have a new onset rash, which can be a side effect from Lamictal, please stop medication immediately and either present to ED or call PCP immediately.  It was  pleasure taking care of you!              We recommend you consider talking with someone at [facility]---see number below    Advocacy/[facility]:  [phone]  /  [phone] /  LVM, taking clients  [facility]:  [phone] /  Onsite, counseling with is virtual, Accepting new clients   [facility]:  [phone] Virtual & Onsite services (Client preference), Accepting New clients  , [place]:  [phone] Virtual & Onsite services (Client preference), Accepting New clients  [facility] (custody):  [phone] Virtual, Accepting new clients  [facility]:   [phone] Virtual/Telephone, accepting clients for waitlisting (time depends on services)      Seizure precautions: Per [facility] statutes, patients with seizures are not allowed to drive until they have been seizure-free for six months and cleared by a physician   Use caution when using heavy equipment or power tools. Avoid working on ladders or at heights. Take showers instead of baths. Ensure the water temperature is not too high on the home water heater. Do not go swimming alone. Do not lock yourself in a room alone (i.e. bathroom). When caring for infants or small children, sit down when holding, feeding, or changing them to minimize risk of injury to the child in the event you have a seizure. Maintain good sleep hygiene. Avoid alcohol.   If patienthas another seizure, call 911 and bring them back to the ED if: A. The seizure lasts longer than 5 minutes.  B. The patient doesn't wake shortly after the seizure or has new problems such as difficulty seeing, speaking or moving following the seizure C. The patient was injured during the seizure D. The patient  has a temperature over 102 F (39C) E. The patient vomited during the seizure and now is having trouble breathing  During the Seizure  - First, ensure adequate ventilation and place patients on the floor on their left side  Loosen clothing around the neck and ensure the airway is patent. If the patient is clenching the teeth, do not force the mouth open with any object as this can cause severe damage - Remove all items from the surrounding that can be hazardous. The patient may be oblivious to what's happening and may not even know what he or she is doing. If the patient is confused and wandering, either gently guide him/her away and block access to outside areas - Reassure the individual and be comforting - Call 911.  In most cases, the seizure ends before EMS arrives. However, there are cases when seizures may last over 3 to 5 minutes. Or the individual may have developed breathing difficulties or severe injuries. If a pregnant patient or a person with diabetes develops a seizure, it is prudent to call an ambulance. - Finally, if the patient does not regain full consciousness, then call EMS. Most patients will remain confused for about 45 to 90 minutes after a seizure, so you must use judgment in calling for help. - Avoid restraints but make sure the patient is in a bed with padded side rails - Place the individual in a lateral position with the neck slightly flexed; this will help the saliva drain from the mouth and prevent the tongue from falling backward - Remove all nearby furniture and other hazards from the area - Provide verbal assurance as the individual is regaining consciousness - Provide the patient with privacy if possible - Call for help and start treatment as ordered by the caregiver  After the Seizure (Postictal Stage)  After a seizure, most patients experience confusion, fatigue, muscle pain and/or a headache. Thus, one should permit the individual to sleep. For the next few days, reassurance is essential. Being calm and helping reorient the person is also of importance.  Most seizures are painless and end spontaneously. Seizures are not harmful to others but can lead to complications such as stress on the lungs, brain and the heart. Individuals with prior lung problems may develop labored breathing and respiratory distress.

## 2021-01-30 ENCOUNTER — Telehealth: Payer: Self-pay

## 2021-01-30 NOTE — Telephone Encounter (Signed)
Transition Care Management Unsuccessful Follow-up Telephone Call  Date of discharge and from where:  01/29/2021 from [facility]  Attempts:  1st Attempt  Reason for unsuccessful TCM follow-up call:  Left voice message    

## 2021-01-31 LAB — CERVICOVAGINAL ANCILLARY ONLY
Bacterial Vaginitis (gardnerella): POSITIVE — AB
Candida Glabrata: NEGATIVE
Candida Vaginitis: NEGATIVE
Chlamydia: NEGATIVE
Comment: NEGATIVE
Comment: NEGATIVE
Comment: NEGATIVE
Comment: NEGATIVE
Comment: NEGATIVE
Comment: NORMAL
Neisseria Gonorrhea: NEGATIVE
Trichomonas: NEGATIVE

## 2021-01-31 NOTE — Telephone Encounter (Signed)
Transition Care Management Unsuccessful Follow-up Telephone Call  Date of discharge and from where:  01/29/2021 from [facility]  Attempts:  2nd Attempt  Reason for unsuccessful TCM follow-up call:  Left voice message

## 2021-01-31 NOTE — Telephone Encounter (Signed)
Transition Care Management Follow-up Telephone Call  Date of discharge and from where: 01/29/2021 from [facility]  How have you been since you were released from the hospital? Pt stated that she is doing okay. Pt stated that she is experiencing a lot of side effects from the medication change from the hospital visit.   Any questions or concerns? No  Items Reviewed:  Did the pt receive and understand the discharge instructions provided? Yes   Medications obtained and verified? Yes   Other? No   Any new allergies since your discharge? No   Dietary orders reviewed? n/a  Do you have support at home? Yes   Functional Questionnaire: (I = Independent and D = Dependent) ADLs: I  Bathing/Dressing- I  Meal Prep- I  Eating- I  Maintaining continence- I  Transferring/Ambulation- I  Managing Meds- I   Follow up appointments reviewed:   PCP Hospital f/u appt confirmed? Yes  Scheduled to see Alvia Grove, DO on 02/17/21.  Specialist Hospital f/u appt confirmed? Yes  Scheduled to see Neurology on 02/08/21.  Are transportation arrangements needed? No   If their condition worsens, is the pt aware to call PCP or go to the Emergency Dept.? Yes  Was the patient provided with contact information for the PCP's office or ED? Yes  Was to pt encouraged to call back with questions or concerns? Yes

## 2021-02-01 ENCOUNTER — Telehealth (HOSPITAL_COMMUNITY): Payer: Self-pay | Admitting: Emergency Medicine

## 2021-02-01 MED ORDER — METRONIDAZOLE 500 MG PO TABS
500.0000 mg | ORAL_TABLET | Freq: Two times a day (BID) | ORAL | 0 refills | Status: DC
Start: 2021-02-01 — End: 2021-05-11

## 2021-02-05 DIAGNOSIS — R569 Unspecified convulsions: Secondary | ICD-10-CM | POA: Diagnosis not present

## 2021-02-05 DIAGNOSIS — R0689 Other abnormalities of breathing: Secondary | ICD-10-CM | POA: Diagnosis not present

## 2021-02-05 DIAGNOSIS — R000 Tachycardia, unspecified: Secondary | ICD-10-CM | POA: Diagnosis not present

## 2021-02-05 DIAGNOSIS — I10 Essential (primary) hypertension: Secondary | ICD-10-CM | POA: Diagnosis not present

## 2021-02-15 ENCOUNTER — Other Ambulatory Visit: Payer: Self-pay

## 2021-02-16 DIAGNOSIS — F339 Major depressive disorder, recurrent, unspecified: Secondary | ICD-10-CM | POA: Diagnosis not present

## 2021-02-17 ENCOUNTER — Ambulatory Visit: Payer: Medicaid Other | Admitting: Family Medicine

## 2021-02-21 MED ORDER — LAMOTRIGINE 25 MG PO TABS: ORAL_TABLET | ORAL | 0 refills | Status: DC

## 2021-02-21 NOTE — Telephone Encounter (Signed)
Lamotrigine Morning Evening Keppra  Week 1 25mg   500mg  BID  Week 2 25mg  25mg  500mg  BID  Week 3 50mg  25mg  500mg  BID  Week 4 50mg  50mg  500mg  BID  Week 5 75mg  50mg  500mg  BID  Week 6 75mg  75mg  500mg  BID  Week 7 100mg  75mg  500mg  BID  Week 8 100mg  100mg  Keppra 250mg  BID  Week 9 100mg  100mg  Stop Keppra.

## 2021-03-02 ENCOUNTER — Emergency Department (HOSPITAL_COMMUNITY)
Admission: EM | Admit: 2021-03-02 | Discharge: 2021-03-02 | Disposition: A | Discharge status: Home / Self Care | Payer: Medicaid Other | Attending: Emergency Medicine | Admitting: Emergency Medicine

## 2021-03-02 DIAGNOSIS — Z91040 Latex allergy status: Secondary | ICD-10-CM | POA: Insufficient documentation

## 2021-03-02 DIAGNOSIS — R569 Unspecified convulsions: Secondary | ICD-10-CM | POA: Insufficient documentation

## 2021-03-02 LAB — CBC WITH DIFFERENTIAL/PLATELET
Abs Immature Granulocytes: 0.01 10*3/uL (ref 0.00–0.07)
Basophils Absolute: 0 10*3/uL (ref 0.0–0.1)
Basophils Relative: 0 %
Eosinophils Absolute: 0.1 10*3/uL (ref 0.0–0.5)
Eosinophils Relative: 2 %
HCT: 37.2 % (ref 36.0–46.0)
Hemoglobin: 11.9 g/dL — ABNORMAL LOW (ref 12.0–15.0)
Immature Granulocytes: 0 %
Lymphocytes Relative: 38 %
Lymphs Abs: 1.5 10*3/uL (ref 0.7–4.0)
MCH: 30.1 pg (ref 26.0–34.0)
MCHC: 32 g/dL (ref 30.0–36.0)
MCV: 94.2 fL (ref 80.0–100.0)
Monocytes Absolute: 0.3 10*3/uL (ref 0.1–1.0)
Monocytes Relative: 7 %
Neutro Abs: 2.1 10*3/uL (ref 1.7–7.7)
Neutrophils Relative %: 53 %
Platelets: 301 10*3/uL (ref 150–400)
RBC: 3.95 MIL/uL (ref 3.87–5.11)
RDW: 12.9 % (ref 11.5–15.5)
WBC: 4 10*3/uL (ref 4.0–10.5)
nRBC: 0 % (ref 0.0–0.2)

## 2021-03-02 LAB — BASIC METABOLIC PANEL
Anion gap: 12 (ref 5–15)
BUN: 13 mg/dL (ref 6–20)
CO2: 21 mmol/L — ABNORMAL LOW (ref 22–32)
Calcium: 9.7 mg/dL (ref 8.9–10.3)
Chloride: 107 mmol/L (ref 98–111)
Creatinine, Ser: 0.76 mg/dL (ref 0.44–1.00)
GFR, Estimated: >60 mL/min (ref >60)
Glucose, Bld: 93 mg/dL (ref 70–99)
Potassium: 3.7 mmol/L (ref 3.5–5.1)
Sodium: 140 mmol/L (ref 135–145)

## 2021-03-02 LAB — I-STAT BETA HCG BLOOD, ED (MC, WL, AP ONLY): I-stat hCG, quantitative: <5 m[IU]/mL (ref <5)

## 2021-03-02 MED ORDER — LORAZEPAM 2 MG/ML IJ SOLN
INTRAMUSCULAR | Status: AC
  Filled 2021-03-02: qty 1

## 2021-03-02 MED ORDER — LORAZEPAM 2 MG/ML IJ SOLN
2.0000 mg | Freq: Once | INTRAMUSCULAR | Status: AC
  Administered 2021-03-02: 2 mg via INTRAVENOUS

## 2021-03-02 NOTE — ED Notes (Signed)
Pt placed on cardiac monitor and continuous pulse ox.

## 2021-03-02 NOTE — Discharge Instructions (Addendum)
Continue medications as previously prescribed.  Follow-up with your neurologist in the next week, and return to the ER if you experience any new and/or concerning symptoms.

## 2021-03-02 NOTE — ED Triage Notes (Addendum)
Here as a visitor, [facility] is being [facility]. Grandmother states that she began having a tonic clonic seizure. Hx of same, taking Keppra and Lamictal. Grandmother states she has seizures when she gets upset. Found on floor seizing. Seizure lasted almost 10 mins before stopping after Ativan administration.

## 2021-03-02 NOTE — ED Provider Notes (Signed)
[facility] EMERGENCY DEPARTMENT Provider Note   CSN: [id number] Arrival date & time: 03/02/21  0256     History Chief Complaint  Patient presents with   Seizures    [name] is a 20 y.o. female.  Patient is a 20 year old female with history of anemia and seizure disorder.  Patient was here visiting her younger brother who earlier this evening swallowed a coin and is in the process of requiring endoscopy for retrieval.  She was in his exam room when she began shaking all over, then fell to the floor.  I was called to the pediatric ICU to evaluate her there.  Patient having ongoing shaking and seizure-like activity upon my arrival.  She adds no additional history secondary to condition.  Additional history taken from the grandmother who tells me this sometimes happens when she becomes upset.  She is on Keppra and Lamictal and grandmother reports being compliant with both.  The history is provided by the patient and a relative (Grandmother).  Seizures Seizure activity on arrival: yes   Seizure type:  Grand mal Initial focality:  None Postictal symptoms: no confusion and no somnolence   Return to baseline: yes   Severity:  Moderate Duration:  10 minutes     Past Medical History:  Diagnosis Date   Seizure (HCC)    Seizures (HCC)    Well child check 04/26/2011    Patient Active Problem List   Diagnosis Date Noted   Seizure (HCC) 01/28/2021   Anemia    Strep throat    Encounter for Nexplanon removal 11/04/2020   Family history of brain cancer 05/20/2015   Seizures (HCC) 10/31/2006    Past Surgical History:  Procedure Laterality Date   LEG SURGERY       OB History   No obstetric history on file.     Family History  Problem Relation Age of Onset   Healthy Mother    Seizures Father     Social History   Tobacco Use   Smoking status: Never   Smokeless tobacco: Never  Substance Use Topics   Alcohol use: No   Drug use: No    Home  Medications Prior to Admission medications   Medication Sig Start Date End Date Taking? Authorizing Provider  metroNIDAZOLE (FLAGYL) 500 MG tablet Take 1 tablet (500 mg total) by mouth 2 (two) times daily. 02/01/21   Lamptey, Britta Mccreedy, MD  benzonatate (TESSALON) 200 MG capsule Take 1 capsule (200 mg total) by mouth every 8 (eight) hours. 01/28/21   Wieters, Hallie C, [name]  Cetirizine HCl 10 MG CAPS Take 1 capsule (10 mg total) by mouth daily for 10 days. 01/28/21 02/07/21  Wieters, Hallie C, [name]  fluticasone (FLONASE) 50 MCG/ACT nasal spray Place 1-2 sprays into both nostrils daily. Patient not taking: No sig reported 08/17/20   Wieters, Hallie C, [name]  Folic Acid 5 MG CAPS Take 1 capsule (5 mg total) by mouth daily. Patient not taking: No sig reported 11/12/20   [name], DO  ibuprofen (ADVIL) 800 MG tablet Take 1 tablet (800 mg total) by mouth 3 (three) times daily. Patient not taking: No sig reported 11/10/20   Wieters, Hallie C, [name]  lamoTRIgine (LAMICTAL) 25 MG tablet Take 2 tablets (50 mg total) by mouth 2 (two) times daily. 02/19/21   Derrel Nip, MD  lamoTRIgine (LAMICTAL) 25 MG tablet Take 2 tablets (50 mg total) by mouth daily AND 1 tablet (25 mg total) at bedtime. 02/13/21  Derrel Nip, MD  lamoTRIgine (LAMICTAL) 25 MG tablet Take 2 tablets (50 mg total) by mouth 2 (two) times daily for 7 days, THEN 3 tablets (75 mg total) 2 (two) times daily for 14 days, THEN 4 tablets (100 mg total) 2 (two) times daily for 21 days. Week 4: Take 50mg  in am, 50mg  in pm  Week 5: Take 75mg  in am, 50mg  in pm Week 6: Take 75mg  in am, 75mg  in pm Week 7: 100mg  am and 75mg  pm  Week 8 and 9: 100mg  am and pm. 02/21/21 04/04/21  , DO  levETIRAcetam (KEPPRA) 500 MG tablet Take 1 tablet (500 mg total) by mouth 2 (two) times daily. 01/29/21   , MD  ondansetron (ZOFRAN ODT) 4 MG disintegrating tablet Take 1 tablet (4 mg total) by mouth every 8 (eight) hours as needed for  nausea or vomiting. 01/28/21   Wieters, Hallie C, [name]  loratadine (CLARITIN) 10 MG tablet Take 1 tablet (10 mg total) by mouth daily. 10/15/18 01/28/21  , MD    Allergies    Codeine, Latex, and Tdap [tetanus-diphth-acell pertussis]  Review of Systems   Review of Systems  Neurological:  Positive for seizures.  All other systems reviewed and are negative.  Physical Exam Updated Vital Signs BP 126/67   Pulse 91   Temp 99 F (37.2 C) (Temporal)   Resp 17   Wt 49.9 kg   SpO2 100%   BMI 19.49 kg/m   Physical Exam Vitals and nursing note reviewed.  Constitutional:      Appearance: She is well-developed.  HENT:     Head: Normocephalic and atraumatic.  Cardiovascular:     Rate and Rhythm: Normal rate and regular rhythm.     Heart sounds: No murmur heard.   No friction rub. No gallop.  Pulmonary:     Effort: Pulmonary effort is normal. No respiratory distress.     Breath sounds: Normal breath sounds. No wheezing.  Abdominal:     General: Bowel sounds are normal. There is no distension.     Palpations: Abdomen is soft.     Tenderness: There is no abdominal tenderness.  Musculoskeletal:        General: Normal range of motion.     Cervical back: Normal range of motion and neck supple.  Skin:    General: Skin is warm and dry.  Neurological:     Comments: Patient with generalized shaking movements upon my initial evaluation.  This involves her torso and extremities.  Eyes deviated upwards.    ED Results / Procedures / Treatments   Labs (all labs ordered are listed, but only abnormal results are displayed) Labs Reviewed  BASIC METABOLIC PANEL  CBC WITH DIFFERENTIAL/PLATELET  I-STAT BETA HCG BLOOD, ED ([facility], [facility], [facility] ONLY)    EKG None  Radiology No results found.  Procedures Procedures   Medications Ordered in ED Medications  LORazepam (ATIVAN) injection 2 mg (2 mg Intravenous Given 03/02/21 0301)    ED Course  I have reviewed the triage vital signs and  the nursing notes.  Pertinent labs & imaging results that were available during my care of the patient were reviewed by me and considered in my medical decision making (see chart for details).    MDM Rules/Calculators/A&P  Patient brought from the pediatric department after developing seizure-like activity while visiting her younger brother who is scheduled for endoscopy to retrieve esophageal foreign body.  She has a history of seizures and  currently takes Keppra and Lamictal.  She reports being compliant with this.  I was asked to see the patient emergently and she began seizing.  I arrived to the pediatric ED to find her in the exam stretcher with generalized shaking and not responding to voice or following commands.  IV access was established and I ordered 2 mg of intravenous Ativan.  Patient continued to have seizure-like activity, however this seemed to resolve when a slight sternal rub was applied.  Patient was observed for approximately 3 hours and seizure activity has resolved.  She now feels back to baseline.  Laboratory studies are unremarkable and I believe discharge is appropriate.  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  Final Clinical Impression(s) / ED Diagnoses Final diagnoses:  None    Rx / DC Orders ED Discharge Orders     None        Geoffery Lyons, MD 03/02/21 [phone]

## 2021-03-02 NOTE — ED Notes (Signed)
ED Provider at bedside. 

## 2021-03-03 ENCOUNTER — Other Ambulatory Visit: Payer: Self-pay

## 2021-03-03 ENCOUNTER — Telehealth: Payer: Self-pay

## 2021-03-03 ENCOUNTER — Ambulatory Visit
Admission: EM | Admit: 2021-03-03 | Discharge: 2021-03-03 | Disposition: A | Discharge status: Home / Self Care | Payer: Medicaid Other | Attending: Family Medicine | Admitting: Family Medicine

## 2021-03-03 ENCOUNTER — Encounter: Payer: Self-pay | Admitting: Emergency Medicine

## 2021-03-03 ENCOUNTER — Ambulatory Visit: Admission: EM | Admit: 2021-03-03 | Discharge: 2021-03-03 | Discharge status: Absent without leave | Payer: Self-pay

## 2021-03-03 DIAGNOSIS — N912 Amenorrhea, unspecified: Secondary | ICD-10-CM | POA: Diagnosis not present

## 2021-03-03 DIAGNOSIS — R112 Nausea with vomiting, unspecified: Secondary | ICD-10-CM

## 2021-03-03 DIAGNOSIS — Z3202 Encounter for pregnancy test, result negative: Secondary | ICD-10-CM

## 2021-03-03 LAB — POCT URINE PREGNANCY: Preg Test, Ur: NEGATIVE

## 2021-03-03 MED ORDER — ONDANSETRON HCL 4 MG PO TABS: 4.0000 mg | ORAL_TABLET | Freq: Four times a day (QID) | ORAL | 0 refills | Status: DC

## 2021-03-03 NOTE — ED Triage Notes (Signed)
Patient c/o emesis x 1 week.   Patient endorses her LMP was approximately Jan 25 2021.   Patient endorses increased emesis in the morning time.   Patient denies changes to vaginal discharge or ABD pain.   Patient hasn't taken any medications for symptoms.

## 2021-03-03 NOTE — Discharge Instructions (Addendum)
I have sent in Zofran for you to take one tablet every 8 hours as needed for nausea.  Follow up at [facility] for [facility] periods  Follow up with this office or with primary care if symptoms are persisting.  Follow up in the ER for high fever, trouble swallowing, trouble breathing, other concerning symptoms.

## 2021-03-03 NOTE — Telephone Encounter (Signed)
Received phone call from patient's grandmother requesting to schedule appointment for seizure follow up.   Scheduled with PCP on Wednesday 7/6. Advised that if patient continues to have seizures she will need to be re evaluated in the ED.   Verbalizes understanding.   Veronda Prude, RN

## 2021-03-03 NOTE — Telephone Encounter (Signed)
Transition Care Management Unsuccessful Follow-up Telephone Call  Date of discharge and from where:  03/02/2021 from [facility]  Attempts:  1st Attempt  Reason for unsuccessful TCM follow-up call:  Left voice message

## 2021-03-04 NOTE — ED Provider Notes (Signed)
[name]    CSN: [id number] Arrival date & time: 03/03/21  1815      History   Chief Complaint Chief Complaint  Patient presents with   Emesis    HPI [name] is a 20 y.o. female.   Reports amenorrhea for the last month.  States that her last period was at the end of May.  Reports that she has been nauseated and vomiting for the last week.  She has history of seizures.  States that she started taking Lamictal about a week and a half ago, around the same time that her symptoms started.  She is tapering up to find a dose that will help control her symptoms.  She is concerned for pregnancy today.  Denies headache, cough, abdominal pain, diarrhea, fever, chills, other symptoms.  ROS per HPI  The history is provided by the patient.  Emesis  Past Medical History:  Diagnosis Date   Seizure (HCC)    Seizures (HCC)    Well child check 04/26/2011    Patient Active Problem List   Diagnosis Date Noted   Seizure (HCC) 01/28/2021   Anemia    Strep throat    Encounter for Nexplanon removal 11/04/2020   Family history of brain cancer 05/20/2015   Seizures (HCC) 10/31/2006    Past Surgical History:  Procedure Laterality Date   LEG SURGERY      OB History   No obstetric history on file.      Home Medications    Prior to Admission medications   Medication Sig Start Date End Date Taking? Authorizing Provider  lamoTRIgine (LAMICTAL) 25 MG tablet Take 2 tablets (50 mg total) by mouth 2 (two) times daily for 7 days, THEN 3 tablets (75 mg total) 2 (two) times daily for 14 days, THEN 4 tablets (100 mg total) 2 (two) times daily for 21 days. Week 4: Take 50mg  in am, 50mg  in pm  Week 5: Take 75mg  in am, 50mg  in pm Week 6: Take 75mg  in am, 75mg  in pm Week 7: 100mg  am and 75mg  pm  Week 8 and 9: 100mg  am and pm. 02/21/21 04/04/21 Yes [name], [name], DO  levETIRAcetam (KEPPRA) 500 MG tablet Take 1 tablet (500 mg total) by mouth 2 (two) times daily. 01/29/21  Yes  , MD  metroNIDAZOLE (FLAGYL) 500 MG tablet Take 1 tablet (500 mg total) by mouth 2 (two) times daily. 02/01/21   Lamptey, , MD  ondansetron (ZOFRAN) 4 MG tablet Take 1 tablet (4 mg total) by mouth every 6 (six) hours. 03/03/21  Yes , NP  benzonatate (TESSALON) 200 MG capsule Take 1 capsule (200 mg total) by mouth every 8 (eight) hours. 01/28/21   Wieters, Hallie C, [name]  Cetirizine HCl 10 MG CAPS Take 1 capsule (10 mg total) by mouth daily for 10 days. 01/28/21 02/07/21  Wieters, Hallie C, [name]  fluticasone (FLONASE) 50 MCG/ACT nasal spray Place 1-2 sprays into both nostrils daily. Patient not taking: No sig reported 08/17/20   Wieters, Hallie C, [name]  Folic Acid 5 MG CAPS Take 1 capsule (5 mg total) by mouth daily. Patient not taking: No sig reported 11/12/20   04/03/21 C, DO  ibuprofen (ADVIL) 800 MG tablet Take 1 tablet (800 mg total) by mouth 3 (three) times daily. Patient not taking: No sig reported 11/10/20   Wieters, Hallie C, [name]  lamoTRIgine (LAMICTAL) 25 MG tablet Take 2 tablets (50 mg total) by mouth 2 (two) times  daily. 02/19/21   Derrel Nip, MD  lamoTRIgine (LAMICTAL) 25 MG tablet Take 2 tablets (50 mg total) by mouth daily AND 1 tablet (25 mg total) at bedtime. 02/13/21   Derrel Nip, MD  ondansetron (ZOFRAN ODT) 4 MG disintegrating tablet Take 1 tablet (4 mg total) by mouth every 8 (eight) hours as needed for nausea or vomiting. 01/28/21   Wieters, Hallie C, [name]  loratadine (CLARITIN) 10 MG tablet Take 1 tablet (10 mg total) by mouth daily. 10/15/18 01/28/21  Nestor Ramp, MD    Family History Family History  Problem Relation Age of Onset   Healthy Mother    Seizures Father     Social History Social History   Tobacco Use   Smoking status: Never   Smokeless tobacco: Never  Substance Use Topics   Alcohol use: No   Drug use: No     Allergies   Codeine, Latex, and Tdap [tetanus-diphth-acell pertussis]   Review of  Systems Review of Systems  Gastrointestinal:  Positive for vomiting.    Physical Exam Triage Vital Signs ED Triage Vitals  Enc Vitals Group     BP 03/03/21 1935 114/72     Pulse Rate 03/03/21 1935 85     Resp 03/03/21 1935 15     Temp 03/03/21 1935 98 F (36.7 C)     Temp Source 03/03/21 1935 Oral     SpO2 03/03/21 1935 97 %     Weight --      Height --      Head Circumference --      Peak Flow --      Pain Score 03/03/21 1932 0     Pain Loc --      Pain Edu? --      Excl. in GC? --    No data found.  Updated Vital Signs BP 114/72 (BP Location: Left Arm)   Pulse 85   Temp 98 F (36.7 C) (Oral)   Resp 15   LMP 01/25/2021   SpO2 97%   Visual Acuity Right Eye Distance:   Left Eye Distance:   Bilateral Distance:    Right Eye Near:   Left Eye Near:    Bilateral Near:     Physical Exam Vitals and nursing note reviewed.  Constitutional:      General: She is not in acute distress.    Appearance: Normal appearance. She is well-developed.  HENT:     Head: Normocephalic and atraumatic.     Nose: Nose normal.     Mouth/Throat:     Mouth: Mucous membranes are moist.     Pharynx: Oropharynx is clear.  Eyes:     Extraocular Movements: Extraocular movements intact.     Conjunctiva/sclera: Conjunctivae normal.     Pupils: Pupils are equal, round, and reactive to light.  Cardiovascular:     Rate and Rhythm: Normal rate and regular rhythm.  Pulmonary:     Effort: Pulmonary effort is normal. No respiratory distress.  Musculoskeletal:        General: Normal range of motion.     Cervical back: Normal range of motion and neck supple.  Skin:    General: Skin is warm and dry.     Capillary Refill: Capillary refill takes less than 2 seconds.  Neurological:     General: No focal deficit present.     Mental Status: She is alert and oriented to person, place, and time.  Psychiatric:  Mood and Affect: Mood normal.        Behavior: Behavior normal.        Thought  Content: Thought content normal.     UC Treatments / Results  Labs (all labs ordered are listed, but only abnormal results are displayed) Labs Reviewed  POCT URINE PREGNANCY    EKG   Radiology No results found.  Procedures Procedures (including critical care time)  Medications Ordered in UC Medications - No data to display  Initial Impression / Assessment and Plan / UC Course  I have reviewed the triage vital signs and the nursing notes.  Pertinent labs & imaging results that were available during my care of the patient were reviewed by me and considered in my medical decision making (see chart for details).    Nausea and vomiting Negative pregnancy test Amenorrhea  Prescribe Zofran as needed nausea Discussed that I think that her symptoms are likely coming from her Lamictal and changing the dose to try to find one that fits [facility] or irregular cycles continue Negative pregnancy test in office today Low suspicion for acute abdomen given that she is nontender, no fever, no chills, no fatigue Follow up with this office or with primary care if symptoms are persisting.  Follow up in the ER for high fever, trouble swallowing, trouble breathing, other concerning symptoms.   Final Clinical Impressions(s) / UC Diagnoses   Final diagnoses:  Nausea and vomiting, intractability of vomiting not specified, unspecified vomiting type  Negative pregnancy test  Amenorrhea     Discharge Instructions      I have sent in Zofran for you to take one tablet every 8 hours as needed for nausea.  [facility]  Follow up with this office or with primary care if symptoms are persisting.  Follow up in the ER for high fever, trouble swallowing, trouble breathing, other concerning symptoms.      ED Prescriptions     Medication Sig Dispense Auth. Provider   ondansetron (ZOFRAN) 4 MG tablet Take  1 tablet (4 mg total) by mouth every 6 (six) hours. 12 tablet Moshe Cipro, NP      PDMP not reviewed this encounter.   Moshe Cipro, NP 03/04/21 1004

## 2021-03-07 NOTE — Progress Notes (Signed)
    SUBJECTIVE:   CHIEF COMPLAINT / HPI:   [name] is a 20yo who presents to follow up on seizures. She is accompanied by her grandmother. Reports last seizure was on 6/30 in which she came to the ED. States she fainted and woke up in the ED. Endorses cause of seizures due to fatigue and stress. States she got mad at the beach in [place] and blanked out says she was cussing, fighting and blanked out.  Patient is taking Keppra and Lamictal for her seizures. Was started on Lamictal on 5/30 by neurology when she was in the hospital and is currently on a taper  Was previously taking Depakote but medication had not been refilled in several months. Had witnessed seizure in the ED, neuro consulted and gave IV valproate then switched to IV keppra loading dose and ultimately switched to Lamictal   Asked patient if she wanted to talk to me alone and she was fine talking with grandma in the room. States she is sexually active with her partner and states she often has unprotected sex because she does not like condoms. Feels she trusts her partner. States she is not wanting to get pregnant right now but "if it happens, it happens." States she knows the consequences of being pregnant. States she did not want birth control and that nobody can make her get it. States will think about oral pills   Last cycle in May 24. Endorses regular clear discharge, no changes from baseline. Denies dysuria or frequent urination  PERTINENT  PMH / PSH:  Seizure disorder  OBJECTIVE:   BP 102/70   Pulse 88   Ht 5\' 3"  (1.6 m)   Wt 50.8 kg   LMP 01/23/2021   SpO2 99%   BMI 19.84 kg/m    General: alert, NAD CV: RRR no murmurs Resp: CTAB normal WOB GI: soft, non distended  ASSESSMENT/PLAN:   No problem-specific Assessment & Plan notes found for this encounter.   Seizure disorder ED visit 5/29 patient was seen by neurology and set up with a medication taper below. Patient is currently on week 5 of her taper. I refilled her  Lamictal and submitted neurology referral. Also provided patient with phone number and advised to call the office to schedule follow up.    Lamotrigine Morning Evening Keppra  Week 1 25mg    500mg  BID  Week 2 25mg  25mg  500mg  BID  Week 3 50mg  25mg  500mg  BID  Week 4 50mg  50mg  500mg  BID  Week 5 75mg  50mg  500mg  BID  Week 6 75mg  75mg  500mg  BID  Week 7 100mg  75mg  500mg  BID  Week 8 100mg  100mg  Keppra 250mg  BID  Week 9 100mg  100mg  Stop Keppra.     Health maintenance  Contraception Because of length of time discussing her seizures and birth control, did not have time to do her pap smear. Scheduled appointment for pap and STI testing. Performed pregnancy test which was negative. After much discussion she agreed to taking oral contraceptives which I sent to her pharmacy. Also recommended she take folic acid supplements in the event that she gets pregnant.   6/29, DO [facility]

## 2021-03-07 NOTE — Telephone Encounter (Signed)
Transition Care Management Unsuccessful Follow-up Telephone Call  Date of discharge and from where:  03/02/2021 from [facility]  Attempts:  2nd Attempt  Reason for unsuccessful TCM follow-up call:  Left voice message    

## 2021-03-08 ENCOUNTER — Ambulatory Visit (INDEPENDENT_AMBULATORY_CARE_PROVIDER_SITE_OTHER): Payer: Medicaid Other | Admitting: Family Medicine

## 2021-03-08 ENCOUNTER — Other Ambulatory Visit: Payer: Self-pay

## 2021-03-08 ENCOUNTER — Encounter: Payer: Self-pay | Admitting: Family Medicine

## 2021-03-08 VITALS — BP 102/70 | HR 88 | Ht 63.0 in | Wt 112.0 lb

## 2021-03-08 DIAGNOSIS — R569 Unspecified convulsions: Secondary | ICD-10-CM | POA: Diagnosis not present

## 2021-03-08 DIAGNOSIS — Z3009 Encounter for other general counseling and advice on contraception: Secondary | ICD-10-CM | POA: Diagnosis not present

## 2021-03-08 LAB — POCT URINE PREGNANCY: Preg Test, Ur: NEGATIVE

## 2021-03-08 MED ORDER — NORGESTIMATE-ETH ESTRADIOL 0.25-35 MG-MCG PO TABS: 1.0000 | ORAL_TABLET | Freq: Every day | ORAL | 3 refills | Status: DC

## 2021-03-08 MED ORDER — LAMOTRIGINE 25 MG PO TABS: ORAL_TABLET | ORAL | 0 refills | Status: DC

## 2021-03-08 NOTE — Patient Instructions (Addendum)
  It was great seeing you today! Today we followed up on your seizures. Please see below for the dosing of medication you should be taking. You are currently on week 5:  Lamotrigine Morning Evening Keppra  Week 1 25mg    500mg  BID  Week 2 25mg  25mg  500mg  BID  Week 3 50mg  25mg  500mg  BID  Week 4 50mg  50mg  500mg  BID  Week 5 75mg  50mg  500mg  BID  Week 6 75mg  75mg  500mg  BID  Week 7 100mg  75mg  500mg  BID  Week 8 100mg  100mg  Keppra 250mg  BID  Week 9 100mg  100mg  Stop Keppra.   We also did a pregnancy test and I prescribed oral contraceptives and sent them to your pharmacy along with refills of your Lamotrigine. I sent in another referral to neurology but please call them as well to schedule your appointment: [facility] Follow up.   Why: please call to schedule an appointment Contact information: [address]     [address] [place]  [phone]  We have scheduled follow up for your pap smear and STD testing, but if you need to be seen earlier than that for any new issues we're happy to fit you in, just give a call!  Visit Reminders: - Stop by the pharmacy to pick up your prescriptions  - Continue to work on your healthy eating habits and incorporating exercise into your daily life.   Please bring all of your medications with you to each visit.   Feel free to call with any questions or concerns at any time, at [phone].   Take care,  Dr. [facility]

## 2021-03-08 NOTE — Telephone Encounter (Signed)
Transition Care Management Follow-up Telephone Call Date of discharge and from where: 03/02/2021 - [facility] How have you been since you were released from the hospital? "I'm fine" Any questions or concerns? No  Items Reviewed: Did the pt receive and understand the discharge instructions provided? Yes  Medications obtained and verified?  N/A Other? No  Any new allergies since your discharge? No  Dietary orders reviewed? No Do you have support at home? Yes    Functional Questionnaire: (I = Independent and D = Dependent) ADLs: I  Bathing/Dressing- I  Meal Prep- I  Eating- I  Maintaining continence- I  Transferring/Ambulation- I  Managing Meds- I  Follow up appointments reviewed:  PCP Hospital f/u appt confirmed? Yes  Scheduled to see Dr. Idalia Needle on 03/08/2021 @ 1100. Specialist Hospital f/u appt confirmed? No   Are transportation arrangements needed? No  If their condition worsens, is the pt aware to call PCP or go to the Emergency Dept.? Yes Was the patient provided with contact information for the PCP's office or ED? Yes Was to pt encouraged to call back with questions or concerns? Yes

## 2021-03-20 ENCOUNTER — Ambulatory Visit: Payer: Medicaid Other | Admitting: Family Medicine

## 2021-04-09 NOTE — Telephone Encounter (Signed)
Open telephone note closed today, CD

## 2021-04-10 ENCOUNTER — Other Ambulatory Visit: Payer: Self-pay | Admitting: Family Medicine

## 2021-04-12 ENCOUNTER — Ambulatory Visit: Payer: Medicaid Other | Admitting: Family Medicine

## 2021-04-13 ENCOUNTER — Other Ambulatory Visit: Payer: Self-pay | Admitting: Family Medicine

## 2021-04-13 ENCOUNTER — Other Ambulatory Visit: Payer: Self-pay

## 2021-04-13 ENCOUNTER — Encounter (HOSPITAL_COMMUNITY): Payer: Self-pay | Admitting: Emergency Medicine

## 2021-04-13 ENCOUNTER — Ambulatory Visit (HOSPITAL_COMMUNITY)
Admission: EM | Admit: 2021-04-13 | Discharge: 2021-04-13 | Disposition: A | Discharge status: Home / Self Care | Payer: Medicaid Other | Attending: Internal Medicine | Admitting: Internal Medicine

## 2021-04-13 DIAGNOSIS — Z760 Encounter for issue of repeat prescription: Secondary | ICD-10-CM

## 2021-04-13 DIAGNOSIS — G40309 Generalized idiopathic epilepsy and epileptic syndromes, not intractable, without status epilepticus: Secondary | ICD-10-CM

## 2021-04-13 MED ORDER — LAMOTRIGINE 25 MG PO TABS: 50.0000 mg | ORAL_TABLET | Freq: Two times a day (BID) | ORAL | 0 refills | Status: DC

## 2021-04-13 NOTE — ED Triage Notes (Signed)
Per Karna Christmas, MD okay for pt to be seen at [facility]

## 2021-04-13 NOTE — ED Triage Notes (Signed)
Pt presents with seizures. States has a seizure approx 1 hour ago. States ran out of medication today. States currently dizzy, lightheaded, and has headache.   Pt unsure how long the seizure lasted.

## 2021-04-13 NOTE — ED Notes (Signed)
Attempt to call from lobby to triage

## 2021-04-13 NOTE — ED Notes (Signed)
Attempt to call from lobby to triage  

## 2021-04-14 ENCOUNTER — Other Ambulatory Visit: Payer: Self-pay

## 2021-04-14 ENCOUNTER — Other Ambulatory Visit: Payer: Self-pay | Admitting: Family Medicine

## 2021-04-14 ENCOUNTER — Emergency Department (HOSPITAL_COMMUNITY): Payer: Medicaid Other

## 2021-04-14 ENCOUNTER — Encounter (HOSPITAL_COMMUNITY): Payer: Self-pay | Admitting: *Deleted

## 2021-04-14 ENCOUNTER — Emergency Department (HOSPITAL_COMMUNITY)
Admission: EM | Admit: 2021-04-14 | Discharge: 2021-04-15 | Disposition: A | Discharge status: Home / Self Care | Payer: Medicaid Other | Attending: Emergency Medicine | Admitting: Emergency Medicine

## 2021-04-14 DIAGNOSIS — W540XXA Bitten by dog, initial encounter: Secondary | ICD-10-CM | POA: Insufficient documentation

## 2021-04-14 DIAGNOSIS — S81802A Unspecified open wound, left lower leg, initial encounter: Secondary | ICD-10-CM | POA: Insufficient documentation

## 2021-04-14 DIAGNOSIS — Z91040 Latex allergy status: Secondary | ICD-10-CM | POA: Diagnosis not present

## 2021-04-14 DIAGNOSIS — S51801A Unspecified open wound of right forearm, initial encounter: Secondary | ICD-10-CM | POA: Diagnosis not present

## 2021-04-14 DIAGNOSIS — Z2914 Encounter for prophylactic rabies immune globin: Secondary | ICD-10-CM | POA: Insufficient documentation

## 2021-04-14 DIAGNOSIS — S51851A Open bite of right forearm, initial encounter: Secondary | ICD-10-CM | POA: Diagnosis not present

## 2021-04-14 DIAGNOSIS — Y9301 Activity, walking, marching and hiking: Secondary | ICD-10-CM | POA: Diagnosis not present

## 2021-04-14 DIAGNOSIS — S8992XA Unspecified injury of left lower leg, initial encounter: Secondary | ICD-10-CM | POA: Diagnosis present

## 2021-04-14 DIAGNOSIS — Z23 Encounter for immunization: Secondary | ICD-10-CM

## 2021-04-14 DIAGNOSIS — Y92009 Unspecified place in unspecified non-institutional (private) residence as the place of occurrence of the external cause: Secondary | ICD-10-CM | POA: Insufficient documentation

## 2021-04-14 DIAGNOSIS — S81852A Open bite, left lower leg, initial encounter: Secondary | ICD-10-CM | POA: Diagnosis not present

## 2021-04-14 MED ORDER — HYDROCODONE-ACETAMINOPHEN 5-325 MG PO TABS
1.0000 | ORAL_TABLET | Freq: Once | ORAL | Status: AC
  Administered 2021-04-14: 1 via ORAL
  Filled 2021-04-14: qty 1

## 2021-04-14 NOTE — ED Provider Notes (Cosign Needed Addendum)
MSE was initiated and I personally evaluated the patient and placed orders (if any) at  10:39 PM on April 14, 2021.  Patient to ED with dog bites to right forearm and left thigh. Unknown dog, does not know who owns the dog or where it can be found. Tetanus status unknown. History of seizures.  Multiple puncture wounds to proximal right forearm. No bony deformity. Left leg not visualized in triage.   Discussed need for rabies series. Accurate weight entered. Patient lists Tetanus allergy so this was not ordered. Will image for FBs.   The patient appears stable so that the remainder of the MSE may be completed by another provider.   Elpidio Anis, [name] 04/14/21 2242    Elpidio Anis, [name] 04/14/21 2243

## 2021-04-14 NOTE — ED Triage Notes (Signed)
The pt was bitten by a strange dog while she was walking just prior to arrival here she has  bite marks to her posterior chest rt forearm and her lt leg no active bleeding   lmp July 18th

## 2021-04-14 NOTE — ED Notes (Signed)
5 Band-Aids applied to small wounds on left leg, none of which are actively bleeding at this time. Patient changed into blue paper scrub pants.Marland Kitchen

## 2021-04-15 DIAGNOSIS — S81852A Open bite, left lower leg, initial encounter: Secondary | ICD-10-CM | POA: Diagnosis not present

## 2021-04-15 DIAGNOSIS — S51851A Open bite of right forearm, initial encounter: Secondary | ICD-10-CM | POA: Diagnosis not present

## 2021-04-15 MED ORDER — AMOXICILLIN-POT CLAVULANATE 875-125 MG PO TABS
1.0000 | ORAL_TABLET | Freq: Once | ORAL | Status: AC
  Administered 2021-04-15: 1 via ORAL
  Filled 2021-04-15: qty 1

## 2021-04-15 MED ORDER — OXYCODONE-ACETAMINOPHEN 5-325 MG PO TABS
1.0000 | ORAL_TABLET | Freq: Once | ORAL | Status: AC
  Administered 2021-04-15: 1 via ORAL
  Filled 2021-04-15: qty 1

## 2021-04-15 MED ORDER — RABIES VACCINE, PCEC IM SUSR
1.0000 mL | Freq: Once | INTRAMUSCULAR | Status: DC
  Filled 2021-04-15: qty 1

## 2021-04-15 MED ORDER — RABIES IMMUNE GLOBULIN 150 UNIT/ML IM INJ
900.0000 [IU] | INJECTION | Freq: Once | INTRAMUSCULAR | Status: DC
  Filled 2021-04-15: qty 6

## 2021-04-15 MED ORDER — AMOXICILLIN-POT CLAVULANATE 875-125 MG PO TABS: 1.0000 | ORAL_TABLET | Freq: Two times a day (BID) | ORAL | 0 refills | Status: DC

## 2021-04-15 NOTE — ED Notes (Signed)
Pt refused labs.(Blood Draw)

## 2021-04-15 NOTE — ED Notes (Signed)
Pregnancy test ordered as protocol prior to give vaccine, pt refuses to have the test done, pt oriented about the need to have the test done prior to vaccine due to possible adverse reaction to baby if pregnant and pt still refuses, ED provider notified.

## 2021-04-15 NOTE — ED Notes (Signed)
Wound care given and dressing applied, pt refuses to get vaccines ordered. ED provider notified.

## 2021-04-17 ENCOUNTER — Telehealth: Payer: Self-pay

## 2021-04-17 NOTE — Telephone Encounter (Signed)
Transition Care Management Unsuccessful Follow-up Telephone Call  Date of discharge and from where:  04/15/2021-[facility]   Attempts:  1st Attempt  Reason for unsuccessful TCM follow-up call:  Left voice message    

## 2021-04-18 NOTE — Telephone Encounter (Signed)
Transition Care Management Unsuccessful Follow-up Telephone Call  Date of discharge and from where:  04/15/2021-[facility]   Attempts:  2nd Attempt  Reason for unsuccessful TCM follow-up call:  Left voice message

## 2021-04-19 NOTE — Telephone Encounter (Signed)
Transition Care Management Unsuccessful Follow-up Telephone Call  Date of discharge and from where:  04/15/2021-[facility]   Attempts:  3rd Attempt  Reason for unsuccessful TCM follow-up call:  Left voice message    

## 2021-04-24 ENCOUNTER — Other Ambulatory Visit: Payer: Self-pay | Admitting: Family Medicine

## 2021-04-25 NOTE — Telephone Encounter (Signed)
Per neurology, patient should be on 100mg  twice a day

## 2021-04-26 ENCOUNTER — Encounter: Payer: Self-pay | Admitting: Family Medicine

## 2021-04-26 ENCOUNTER — Ambulatory Visit (INDEPENDENT_AMBULATORY_CARE_PROVIDER_SITE_OTHER): Payer: Medicaid Other | Admitting: Family Medicine

## 2021-04-26 ENCOUNTER — Other Ambulatory Visit (HOSPITAL_COMMUNITY)
Admission: RE | Admit: 2021-04-26 | Discharge: 2021-04-26 | Disposition: A | Discharge status: Home / Self Care | Payer: Medicaid Other | Source: Ambulatory Visit | Attending: Family Medicine | Admitting: Family Medicine

## 2021-04-26 ENCOUNTER — Other Ambulatory Visit: Payer: Self-pay

## 2021-04-26 VITALS — BP 101/80 | HR 91 | Ht 63.0 in | Wt 111.6 lb

## 2021-04-26 DIAGNOSIS — Z124 Encounter for screening for malignant neoplasm of cervix: Secondary | ICD-10-CM | POA: Diagnosis not present

## 2021-04-26 DIAGNOSIS — Z113 Encounter for screening for infections with a predominantly sexual mode of transmission: Secondary | ICD-10-CM | POA: Insufficient documentation

## 2021-04-26 DIAGNOSIS — N923 Ovulation bleeding: Secondary | ICD-10-CM

## 2021-04-26 LAB — POCT WET PREP (WET MOUNT)
Clue Cells Wet Prep Whiff POC: POSITIVE
Trichomonas Wet Prep HPF POC: ABSENT

## 2021-04-26 LAB — POCT URINE PREGNANCY: Preg Test, Ur: NEGATIVE

## 2021-04-26 MED ORDER — LAMOTRIGINE 100 MG PO TABS: 100.0000 mg | ORAL_TABLET | Freq: Two times a day (BID) | ORAL | 3 refills | Status: DC

## 2021-04-26 NOTE — Patient Instructions (Addendum)
It was great seeing you today!  Today you came in for your pap smear and we are also checking for STIs as well as a urine pregnancy test.  I will call you for any abnormal results, and will send you a message with normal results.   I will also make sure you have more refills of your Lamictal which you should be taking 100mg  twice a day.  Recommend starting to take a prenatal gummy with folic acid at least 1mg  daily. You can continue your Lamictal as above, it is the safest for pregnancy.    I will see you next year for your next annual exam, but if you need to be seen earlier than that for any new issues or you do become pregnant give me a call and we will schedule you to be seen.   Feel free to call with any questions or concerns at any time, at [phone].   Take care,  Dr. [facility]

## 2021-04-26 NOTE — Progress Notes (Signed)
.     SUBJECTIVE:   CHIEF COMPLAINT / HPI:   Vaginal Discharge: Patient is a 20 y.o. female presenting for a pap smear. She endorses vaginal discharge for about a week.  She states the discharge is of thin white consistency.  She also endorses urinary frequency. Denies dysuria or urgency. She is interested in screening for sexually transmitted infections today.  She denies taking birth control and is wanting to get pregnant.   She requests more tablets of Lamictal. She denies any recent seizures.   OBJECTIVE:   BP 101/80   Pulse 91   Ht 5\' 3"  (1.6 m)   Wt 111 lb 9.6 oz (50.6 kg)   LMP 04/12/2021 Comment: pt shielded  SpO2 99%   BMI 19.77 kg/m    General: NAD, pleasant, able to participate in exam Respiratory: Normal effort, no obvious respiratory distress Pelvic: VULVA: normal appearing vulva with no masses, tenderness or lesions, VAGINA: Normal appearing vagina with normal color, no lesions, with scant and white discharge present, CERVIX: No lesions, scant and white discharge present,  Chaperone 06/12/2021 present for pelvic exam  ASSESSMENT/PLAN:   No problem-specific Assessment & Plan notes found for this encounter.   Pap smear  Paient presents for pap smear and STI testing. Endorses white vaginal discharge and urinary frequency. Patient also requesting pregnancy test. Declines contraception. Physical exam significant for thin white discharge. Cervix without visual lesions.   - f/u pap results  -Wet prep -GC/chlamydia  - UA -Recommended taking 1 mg of folic acid daily   Refilled Lamictal 100mg  BID    [name], DO [facility]

## 2021-04-27 ENCOUNTER — Telehealth: Payer: Self-pay | Admitting: Family Medicine

## 2021-04-27 LAB — URINALYSIS, ROUTINE W REFLEX MICROSCOPIC
Bilirubin, UA: NEGATIVE
Glucose, UA: NEGATIVE
Ketones, UA: NEGATIVE
Leukocytes,UA: NEGATIVE
Nitrite, UA: NEGATIVE
RBC, UA: NEGATIVE
Specific Gravity, UA: 1.026 (ref 1.005–1.030)
Urobilinogen, Ur: 0.2 mg/dL (ref 0.2–1.0)
pH, UA: 6 (ref 5.0–7.5)

## 2021-04-27 LAB — CERVICOVAGINAL ANCILLARY ONLY
Chlamydia: POSITIVE — AB
Comment: NEGATIVE
Comment: NORMAL
Neisseria Gonorrhea: NEGATIVE

## 2021-04-27 NOTE — Telephone Encounter (Signed)
Called patient to inform of results. LM.Will try again in the morning.

## 2021-04-30 ENCOUNTER — Encounter (HOSPITAL_COMMUNITY): Payer: Self-pay | Admitting: Emergency Medicine

## 2021-04-30 ENCOUNTER — Other Ambulatory Visit: Payer: Self-pay

## 2021-04-30 ENCOUNTER — Ambulatory Visit
Admission: EM | Admit: 2021-04-30 | Discharge: 2021-04-30 | Disposition: A | Discharge status: Other Acute Inpatient Hospital | Payer: Medicaid Other

## 2021-04-30 ENCOUNTER — Emergency Department (HOSPITAL_COMMUNITY)
Admission: EM | Admit: 2021-04-30 | Discharge: 2021-04-30 | Disposition: A | Discharge status: Home / Self Care | Payer: Medicaid Other | Attending: Emergency Medicine | Admitting: Emergency Medicine

## 2021-04-30 ENCOUNTER — Emergency Department (HOSPITAL_COMMUNITY)
Admission: EM | Admit: 2021-04-30 | Discharge: 2021-05-01 | Disposition: A | Discharge status: Home / Self Care | Payer: Medicaid Other | Source: Home / Self Care | Attending: Emergency Medicine | Admitting: Emergency Medicine

## 2021-04-30 DIAGNOSIS — G40909 Epilepsy, unspecified, not intractable, without status epilepticus: Secondary | ICD-10-CM | POA: Insufficient documentation

## 2021-04-30 DIAGNOSIS — E860 Dehydration: Secondary | ICD-10-CM | POA: Insufficient documentation

## 2021-04-30 DIAGNOSIS — R109 Unspecified abdominal pain: Secondary | ICD-10-CM

## 2021-04-30 DIAGNOSIS — R569 Unspecified convulsions: Secondary | ICD-10-CM | POA: Insufficient documentation

## 2021-04-30 DIAGNOSIS — R112 Nausea with vomiting, unspecified: Secondary | ICD-10-CM | POA: Insufficient documentation

## 2021-04-30 DIAGNOSIS — Z91040 Latex allergy status: Secondary | ICD-10-CM | POA: Insufficient documentation

## 2021-04-30 DIAGNOSIS — Z79899 Other long term (current) drug therapy: Secondary | ICD-10-CM | POA: Insufficient documentation

## 2021-04-30 DIAGNOSIS — R1084 Generalized abdominal pain: Secondary | ICD-10-CM | POA: Diagnosis not present

## 2021-04-30 LAB — COMPREHENSIVE METABOLIC PANEL
ALT: 11 U/L (ref 0–44)
AST: 19 U/L (ref 15–41)
Albumin: 4.3 g/dL (ref 3.5–5.0)
Alkaline Phosphatase: 46 U/L (ref 38–126)
Anion gap: 12 (ref 5–15)
BUN: 11 mg/dL (ref 6–20)
CO2: 21 mmol/L — ABNORMAL LOW (ref 22–32)
Calcium: 9.4 mg/dL (ref 8.9–10.3)
Chloride: 106 mmol/L (ref 98–111)
Creatinine, Ser: 0.97 mg/dL (ref 0.44–1.00)
GFR, Estimated: >60 mL/min (ref >60)
Glucose, Bld: 131 mg/dL — ABNORMAL HIGH (ref 70–99)
Potassium: 3.6 mmol/L (ref 3.5–5.1)
Sodium: 139 mmol/L (ref 135–145)
Total Bilirubin: 0.7 mg/dL (ref 0.3–1.2)
Total Protein: 7.5 g/dL (ref 6.5–8.1)

## 2021-04-30 LAB — URINALYSIS, ROUTINE W REFLEX MICROSCOPIC
Bilirubin Urine: NEGATIVE
Glucose, UA: NEGATIVE mg/dL
Hgb urine dipstick: NEGATIVE
Ketones, ur: NEGATIVE mg/dL
Leukocytes,Ua: NEGATIVE
Nitrite: NEGATIVE
Protein, ur: NEGATIVE mg/dL
Specific Gravity, Urine: 1.025 (ref 1.005–1.030)
pH: 7 (ref 5.0–8.0)

## 2021-04-30 LAB — CBC WITH DIFFERENTIAL/PLATELET
Abs Immature Granulocytes: 0.03 10*3/uL (ref 0.00–0.07)
Basophils Absolute: 0 10*3/uL (ref 0.0–0.1)
Basophils Relative: 0 %
Eosinophils Absolute: 0.1 10*3/uL (ref 0.0–0.5)
Eosinophils Relative: 1 %
HCT: 38.8 % (ref 36.0–46.0)
Hemoglobin: 12.1 g/dL (ref 12.0–15.0)
Immature Granulocytes: 0 %
Lymphocytes Relative: 14 %
Lymphs Abs: 1.5 10*3/uL (ref 0.7–4.0)
MCH: 28.7 pg (ref 26.0–34.0)
MCHC: 31.2 g/dL (ref 30.0–36.0)
MCV: 92.2 fL (ref 80.0–100.0)
Monocytes Absolute: 0.5 10*3/uL (ref 0.1–1.0)
Monocytes Relative: 4 %
Neutro Abs: 8.4 10*3/uL — ABNORMAL HIGH (ref 1.7–7.7)
Neutrophils Relative %: 81 %
Platelets: 406 10*3/uL — ABNORMAL HIGH (ref 150–400)
RBC: 4.21 MIL/uL (ref 3.87–5.11)
RDW: 12.7 % (ref 11.5–15.5)
WBC: 10.5 10*3/uL (ref 4.0–10.5)
nRBC: 0 % (ref 0.0–0.2)

## 2021-04-30 LAB — LIPASE, BLOOD: Lipase: 24 U/L (ref 11–51)

## 2021-04-30 LAB — CBG MONITORING, ED: Glucose-Capillary: 68 mg/dL — ABNORMAL LOW (ref 70–99)

## 2021-04-30 LAB — RAPID URINE DRUG SCREEN, HOSP PERFORMED
Amphetamines: NOT DETECTED
Barbiturates: NOT DETECTED
Benzodiazepines: NOT DETECTED
Cocaine: NOT DETECTED
Opiates: NOT DETECTED
Tetrahydrocannabinol: NOT DETECTED

## 2021-04-30 LAB — I-STAT BETA HCG BLOOD, ED (MC, WL, AP ONLY): I-stat hCG, quantitative: <5 m[IU]/mL (ref <5)

## 2021-04-30 MED ORDER — SODIUM CHLORIDE 0.9 % IV BOLUS
1000.0000 mL | Freq: Once | INTRAVENOUS | Status: AC
  Administered 2021-05-01: 1000 mL via INTRAVENOUS

## 2021-04-30 MED ORDER — ONDANSETRON HCL 4 MG/2ML IJ SOLN
4.0000 mg | Freq: Once | INTRAMUSCULAR | Status: AC
Start: 2021-04-30 — End: 2021-05-01
  Administered 2021-05-01: 4 mg via INTRAVENOUS
  Filled 2021-04-30: qty 2

## 2021-04-30 NOTE — ED Triage Notes (Signed)
States she had emesis Saturday, approx 5 episodes, tried to get up this morning, was weak and dizzy and had more emesis. Requesting a pregnancy test. States LMP July 10th. States she fell trying to get up today due to dizziness.

## 2021-04-30 NOTE — ED Provider Notes (Signed)
[facility]-EMERGENCY DEPT Provider Note   CSN: [id number] Arrival date & time: 04/30/21  1546     History Chief Complaint  Patient presents with   Emesis   Weakness    [name] is a 20 y.o. female.  Patient with history of seizures since childhood to ED with complaint of nausea, vomiting, dizziness that started last night. She reports symptoms started with nausea with one episode of emesis last night. She was seen at [facility] ED and discharged to home this morning. She also reports having a seizure during her evaluation at [facility] during the night and received additional Keppra while there, despite reporting she has been compliant with all medications. She denies alcohol or drug use. No fever, diarrhea or significant pain. No cough, SOB, headache. She returned to the ED tonight reporting continued vomiting today x 3, dizziness like she's going to pass out that is worse with position change, as well as another seizure.   The history is provided by the patient. No language interpreter was used.  Emesis Associated symptoms: no abdominal pain, no chills, no fever and no myalgias   Weakness Associated symptoms: dizziness, nausea, seizures and vomiting   Associated symptoms: no abdominal pain, no chest pain, no fever, no myalgias and no shortness of breath       Past Medical History:  Diagnosis Date   Seizure (HCC)    Seizures (HCC)    Well child check 04/26/2011    Patient Active Problem List   Diagnosis Date Noted   Seizure (HCC) 01/28/2021   Anemia    Strep throat    Encounter for Nexplanon removal 11/04/2020   Family history of brain cancer 05/20/2015   Seizures (HCC) 10/31/2006    Past Surgical History:  Procedure Laterality Date   LEG SURGERY       OB History   No obstetric history on file.     Family History  Problem Relation Age of Onset   Healthy Mother    Seizures Father     Social History   Tobacco Use   Smoking status: Never    Smokeless tobacco: Never  Substance Use Topics   Alcohol use: No   Drug use: No    Home Medications Prior to Admission medications   Medication Sig Start Date End Date Taking? Authorizing Provider  metroNIDAZOLE (FLAGYL) 500 MG tablet Take 1 tablet (500 mg total) by mouth 2 (two) times daily. 02/01/21   Merrilee Jansky, MD  amoxicillin-clavulanate (AUGMENTIN) 875-125 MG tablet Take 1 tablet by mouth 2 (two) times daily. One po bid x 7 days 04/15/21   Mesner, Barbara Cower, MD  benzonatate (TESSALON) 200 MG capsule Take 1 capsule (200 mg total) by mouth every 8 (eight) hours. 01/28/21   Wieters, Hallie C, [name]  Cetirizine HCl 10 MG CAPS Take 1 capsule (10 mg total) by mouth daily for 10 days. 01/28/21 02/07/21  Wieters, Hallie C, [name]  lamoTRIgine (LAMICTAL) 100 MG tablet Take 1 tablet (100 mg total) by mouth 2 (two) times daily. 04/26/21   Cora Collum, DO  lamoTRIgine (LAMICTAL) 25 MG tablet Take 2 tablets (50 mg total) by mouth daily AND 1 tablet (25 mg total) at bedtime. 02/13/21   Derrel Nip, MD  lamoTRIgine (LAMICTAL) 25 MG tablet Take 2 tablets (50 mg total) by mouth 2 (two) times daily for 7 days, THEN 3 tablets (75 mg total) 2 (two) times daily for 14 days, THEN 4 tablets (100 mg total) 2 (two) times  daily for 21 days. Week 4: Take 50mg  in am, 50mg  in pm  Week 5: Take 75mg  in am, 50mg  in pm Week 6: Take 75mg  in am, 75mg  in pm Week 7: 100mg  am and 75mg  pm  Week 8 and 9: 100mg  am and pm. 03/08/21 04/19/21  , DO  lamoTRIgine (LAMICTAL) 25 MG tablet Take 2 tablets (50 mg total) by mouth 2 (two) times daily. 04/13/21 05/13/21  , DO  levETIRAcetam (KEPPRA) 500 MG tablet Take 1 tablet (500 mg total) by mouth 2 (two) times daily. 01/29/21   , MD  norgestimate-ethinyl estradiol (SPRINTEC 28) 0.25-35 MG-MCG tablet Take 1 tablet by mouth daily. 03/08/21   04/21/21, DO  ondansetron (ZOFRAN ODT) 4 MG disintegrating tablet Take 1 tablet (4 mg total) by  mouth every 8 (eight) hours as needed for nausea or vomiting. 01/28/21   Wieters, Hallie C, [name]  ondansetron (ZOFRAN) 4 MG tablet Take 1 tablet (4 mg total) by mouth every 6 (six) hours. 03/03/21   07/13/21, NP  loratadine (CLARITIN) 10 MG tablet Take 1 tablet (10 mg total) by mouth daily. 10/15/18 01/28/21  Derrel Nip, MD    Allergies    Codeine, Latex, and Tdap [tetanus-diphth-acell pertussis]  Review of Systems   Review of Systems  Constitutional:  Negative for chills and fever.  HENT: Negative.    Eyes:  Negative for visual disturbance.  Respiratory: Negative.  Negative for shortness of breath.   Cardiovascular: Negative.  Negative for chest pain.  Gastrointestinal:  Positive for nausea and vomiting. Negative for abdominal pain.  Genitourinary:  Negative for decreased urine volume.  Musculoskeletal: Negative.  Negative for myalgias.  Skin: Negative.   Neurological:  Positive for dizziness, seizures and weakness.   Physical Exam Updated Vital Signs BP 124/70 (BP Location: Left Arm)   Pulse (!) 104   Temp 98.8 F (37.1 C) (Oral)   Resp (!) 21   LMP 04/12/2021 Comment: pt shielded  SpO2 100%   Physical Exam Constitutional:      Appearance: She is well-developed.  HENT:     Head: Normocephalic.  Cardiovascular:     Rate and Rhythm: Normal rate and regular rhythm.     Heart sounds: No murmur heard. Pulmonary:     Effort: Pulmonary effort is normal.     Breath sounds: Normal breath sounds. No wheezing, rhonchi or rales.  Abdominal:     General: Bowel sounds are normal.     Palpations: Abdomen is soft.     Tenderness: There is no abdominal tenderness. There is no guarding or rebound.  Musculoskeletal:        General: Normal range of motion.     Cervical back: Normal range of motion and neck supple.  Skin:    General: Skin is warm and dry.  Neurological:     General: No focal deficit present.     Mental Status: She is alert and oriented to person, place, and  time.     Sensory: No sensory deficit.     Motor: No weakness.     Coordination: Coordination normal.     Gait: Gait normal.    ED Results / Procedures / Treatments   Labs (all labs ordered are listed, but only abnormal results are displayed) Labs Reviewed - No data to display  EKG EKG Interpretation  Date/Time:  Sunday April 30 2021 21:48:31 EDT Ventricular Rate:  98 PR Interval:  128 QRS Duration: 91 QT  Interval:  354 QTC Calculation: 452 R Axis:   78 Text Interpretation: Sinus rhythm Right atrial enlargement Consider right ventricular hypertrophy Artifact in lead(s) I III aVR aVL V1 V2 No significant change since last tracing Confirmed by Susy Frizzle [phone]) on 04/30/2021 10:07:59 PM  Radiology No results found.  Procedures Procedures   Medications Ordered in ED Medications  sodium chloride 0.9 % bolus 1,000 mL (has no administration in time range)  ondansetron (ZOFRAN) injection 4 mg (has no administration in time range)    ED Course  I have reviewed the triage vital signs and the nursing notes.  Pertinent labs & imaging results that were available during my care of the patient were reviewed by me and considered in my medical decision making (see chart for details).    MDM Rules/Calculators/A&P                           Patient returns to ED with continued symptoms of dizziness, vomiting. Seen yesterday for same.   VSS. Her orthostatics suggest some hypovolemia. She is given a liter bolus of fluids. Labs not repeated in this well appearing patient.   After IV fluids, she reports feeling significantly better. She can be discharge home with Zofran. Return precautions discussed.    Final Clinical Impression(s) / ED Diagnoses Final diagnoses:  None   Dehydration Vomiting  Rx / DC Orders ED Discharge Orders     None        Danne Harbor 05/03/21 0108    Pollyann Savoy, MD 05/04/21 [phone]

## 2021-04-30 NOTE — ED Notes (Signed)
Patient given something to drink.

## 2021-04-30 NOTE — ED Triage Notes (Signed)
Pt presents from lobby with seizure like activity. Pt A&Ox4 on arrival. Pt also c/o abdominal pain. No hx of seizures.

## 2021-04-30 NOTE — ED Notes (Signed)
Security called to the room due to the patients visitor being rude to the staff.

## 2021-04-30 NOTE — ED Provider Notes (Signed)
[facility] EMERGENCY DEPARTMENT Provider Note   CSN: [id number] Arrival date & time: 04/30/21  0028     History No chief complaint on file.   [name] is a 20 y.o. female.  Patient presents to the emergency department for evaluation of abdominal pain.  Upon arrival in triage she began to have an episode of shaking all over and was brought back immediately to the ER exam room.  At arrival she is not answering any questions.  Level 5 caveat due to altered mental status.      Past Medical History:  Diagnosis Date   Seizure (HCC)    Seizures (HCC)    Well child check 04/26/2011    Patient Active Problem List   Diagnosis Date Noted   Seizure (HCC) 01/28/2021   Anemia    Strep throat    Encounter for Nexplanon removal 11/04/2020   Family history of brain cancer 05/20/2015   Seizures (HCC) 10/31/2006    Past Surgical History:  Procedure Laterality Date   LEG SURGERY       OB History   No obstetric history on file.     Family History  Problem Relation Age of Onset   Healthy Mother    Seizures Father     Social History   Tobacco Use   Smoking status: Never   Smokeless tobacco: Never  Substance Use Topics   Alcohol use: No   Drug use: No    Home Medications Prior to Admission medications   Medication Sig Start Date End Date Taking? Authorizing Provider  metroNIDAZOLE (FLAGYL) 500 MG tablet Take 1 tablet (500 mg total) by mouth 2 (two) times daily. 02/01/21   Merrilee Jansky, MD  amoxicillin-clavulanate (AUGMENTIN) 875-125 MG tablet Take 1 tablet by mouth 2 (two) times daily. One po bid x 7 days 04/15/21   Mesner, Barbara Cower, MD  benzonatate (TESSALON) 200 MG capsule Take 1 capsule (200 mg total) by mouth every 8 (eight) hours. 01/28/21   Wieters, Hallie C, [name]  Cetirizine HCl 10 MG CAPS Take 1 capsule (10 mg total) by mouth daily for 10 days. 01/28/21 02/07/21  Wieters, Hallie C, [name]  lamoTRIgine (LAMICTAL) 100 MG tablet Take 1 tablet (100 mg  total) by mouth 2 (two) times daily. 04/26/21   Cora Collum, DO  lamoTRIgine (LAMICTAL) 25 MG tablet Take 2 tablets (50 mg total) by mouth daily AND 1 tablet (25 mg total) at bedtime. 02/13/21   Derrel Nip, MD  lamoTRIgine (LAMICTAL) 25 MG tablet Take 2 tablets (50 mg total) by mouth 2 (two) times daily for 7 days, THEN 3 tablets (75 mg total) 2 (two) times daily for 14 days, THEN 4 tablets (100 mg total) 2 (two) times daily for 21 days. Week 4: Take 50mg  in am, 50mg  in pm  Week 5: Take 75mg  in am, 50mg  in pm Week 6: Take 75mg  in am, 75mg  in pm Week 7: 100mg  am and 75mg  pm  Week 8 and 9: 100mg  am and pm. 03/08/21 04/19/21  , DO  lamoTRIgine (LAMICTAL) 25 MG tablet Take 2 tablets (50 mg total) by mouth 2 (two) times daily. 04/13/21 05/13/21  , DO  levETIRAcetam (KEPPRA) 500 MG tablet Take 1 tablet (500 mg total) by mouth 2 (two) times daily. 01/29/21   , MD  norgestimate-ethinyl estradiol (SPRINTEC 28) 0.25-35 MG-MCG tablet Take 1 tablet by mouth daily. 03/08/21   04/21/21, DO  ondansetron (ZOFRAN ODT) 4  MG disintegrating tablet Take 1 tablet (4 mg total) by mouth every 8 (eight) hours as needed for nausea or vomiting. 01/28/21   Wieters, Hallie C, [name]  ondansetron (ZOFRAN) 4 MG tablet Take 1 tablet (4 mg total) by mouth every 6 (six) hours. 03/03/21   Moshe Cipro, NP  loratadine (CLARITIN) 10 MG tablet Take 1 tablet (10 mg total) by mouth daily. 10/15/18 01/28/21  Nestor Ramp, MD    Allergies    Codeine, Latex, and Tdap [tetanus-diphth-acell pertussis]  Review of Systems   Review of Systems  Unable to perform ROS: Mental status change   Physical Exam Updated Vital Signs BP 118/70   Pulse 87   Temp 98.8 F (37.1 C) (Oral)   Resp (!) 25   Ht 5\' 3"  (1.6 m)   Wt 50.6 kg   LMP 04/12/2021 Comment: pt shielded  SpO2 100%   BMI 19.76 kg/m   Physical Exam Vitals and nursing note reviewed.  Constitutional:      General:  She is not in acute distress.    Appearance: Normal appearance. She is well-developed.  HENT:     Head: Normocephalic and atraumatic.     Right Ear: Hearing normal.     Left Ear: Hearing normal.     Nose: Nose normal.  Eyes:     Conjunctiva/sclera: Conjunctivae normal.     Pupils: Pupils are equal, round, and reactive to light.  Cardiovascular:     Rate and Rhythm: Regular rhythm.     Heart sounds: S1 normal and S2 normal. No murmur heard.   No friction rub. No gallop.  Pulmonary:     Effort: Pulmonary effort is normal. No respiratory distress.     Breath sounds: Normal breath sounds.  Chest:     Chest wall: No tenderness.  Abdominal:     General: Bowel sounds are normal.     Palpations: Abdomen is soft.     Tenderness: There is no abdominal tenderness. There is no guarding or rebound. Negative signs include Murphy's sign and McBurney's sign.     Hernia: No hernia is present.  Musculoskeletal:        General: Normal range of motion.     Cervical back: Normal range of motion and neck supple.  Skin:    General: Skin is warm and dry.     Findings: No rash.  Neurological:     Mental Status: She is alert.     GCS: GCS eye subscore is 4. GCS verbal subscore is 5. GCS motor subscore is 6.     Cranial Nerves: No cranial nerve deficit.     Comments: Patient irregularly shaking extremities, eyes and eyelids twitching.  Patient lifting her legs off the bed and holding them in strange orientations.    ED Results / Procedures / Treatments   Labs (all labs ordered are listed, but only abnormal results are displayed) Labs Reviewed  CBC WITH DIFFERENTIAL/PLATELET - Abnormal; Notable for the following components:      Result Value   Platelets 406 (*)    Neutro Abs 8.4 (*)    All other components within normal limits  COMPREHENSIVE METABOLIC PANEL - Abnormal; Notable for the following components:   CO2 21 (*)    Glucose, Bld 131 (*)    All other components within normal limits   URINALYSIS, ROUTINE W REFLEX MICROSCOPIC - Abnormal; Notable for the following components:   APPearance HAZY (*)    All other components within normal limits  RAPID URINE DRUG SCREEN, HOSP PERFORMED  LIPASE, BLOOD  I-STAT BETA HCG BLOOD, ED ([facility], [facility], [facility] ONLY)    EKG None  Radiology No results found.  Procedures Procedures   Medications Ordered in ED Medications - No data to display  ED Course  I have reviewed the triage vital signs and the nursing notes.  Pertinent labs & imaging results that were available during my care of the patient were reviewed by me and considered in my medical decision making (see chart for details).    MDM Rules/Calculators/A&P                           Patient brought back to the exam room having an episode of shaking all over.  Patient with random movements of extremities, eyes and eyelids.  She also had episodes of holding her extremities up off of the bed without any shaking movements.  Episode did not resemble epileptic seizure.  All of these movements were redirectable.  When patient's name was called she would look at the speaker and was able to stop shaking when told to stop.  No postictal state.  Patient has rested comfortably for a while.  Lab work is unremarkable.  Upon examination when she is alert, she no longer has abdominal pain.  No further work-up necessary for her original complaint.  Reviewing records reveals the patient does have a history of seizures.  She takes Keppra and Topamax.  I do not believe that this was a seizure tonight, but will give additional dose of Keppra as it will not hurt.  Continue her home medications and follow-up with her physician.  Final Clinical Impression(s) / ED Diagnoses Final diagnoses:  Seizure-like activity (HCC)  Abdominal pain, unspecified abdominal location    Rx / DC Orders ED Discharge Orders     None        Ahja Martello, Canary Brim, MD 04/30/21 [phone]

## 2021-04-30 NOTE — ED Provider Notes (Signed)
Emergency Medicine Provider Triage Evaluation Note  [name] , a 20 y.o. female  was evaluated in triage.  Pt complains of abdominal pain.  She is also requesting a pregnancy test.  She was seen early this morning when she had a reported episode of shaking.  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   She states that she was not given anything for her nausea vomiting, she has vomited 5 times since and is requesting a pregnancy test.  Pregnancy test was obtained this morning and was negative..  Review of Systems  Positive: Nausea, vomiting Negative: Fevers  Physical Exam  BP 112/89 (BP Location: Left Arm)   Pulse 90   Temp 98.4 F (36.9 C) (Oral)   Resp 16   LMP 04/12/2021 Comment: pt shielded  SpO2 100%  Gen:   Awake, no distress   Resp:  Normal effort  MSK:   Moves extremities without difficulty  Other:  Patient is awake and alert.  Speech is nonslurred, answers questions appropriately without difficulty.   Medical Decision Making  Medically screening exam initiated at 4:44 PM.  Appropriate orders placed.  [name] was informed that the remainder of the evaluation will be completed by another provider, this initial triage assessment does not replace that evaluation, and the importance of remaining in the ED until their evaluation is complete.     Cristina Gong, [name] 04/30/21 1650    Tanda Rockers A, DO 05/01/21 [phone]

## 2021-04-30 NOTE — ED Provider Notes (Signed)
9:39 PM Called to room for patient with seizure like activity as she was being placed into a treatment room, but still sitting in the wheelchair. She had some mild tonic-clonic activity but was able to move her arm purposefully to the arm rests. She stopped shaking, opened her eyes and was immediately awake and able to ambulate to the bed. No further immediate treatment needed. Will recheck CBG   Pollyann Savoy, MD 04/30/21 2141

## 2021-05-01 ENCOUNTER — Other Ambulatory Visit: Payer: Self-pay | Admitting: Family Medicine

## 2021-05-01 MED ORDER — ONDANSETRON 4 MG PO TBDP: 4.0000 mg | ORAL_TABLET | Freq: Three times a day (TID) | ORAL | 0 refills | Status: DC | PRN

## 2021-05-01 MED ORDER — DOXYCYCLINE HYCLATE 100 MG PO TABS: 100.0000 mg | ORAL_TABLET | Freq: Two times a day (BID) | ORAL | 0 refills | Status: AC

## 2021-05-01 MED ORDER — SODIUM CHLORIDE 0.9 % IV BOLUS: 1000.0000 mL | Freq: Once | INTRAVENOUS | Status: DC

## 2021-05-01 NOTE — Discharge Instructions (Addendum)
Drink lots of water and less caffeinated beverages. Use Zofran for any further nausea as directed.

## 2021-05-02 ENCOUNTER — Telehealth: Payer: Self-pay | Admitting: *Deleted

## 2021-05-02 NOTE — Telephone Encounter (Signed)
Transition Care Management Unsuccessful Follow-up Telephone Call  Date of discharge and from where:  05/01/2021 [name]  Attempts:  1st Attempt  Reason for unsuccessful TCM follow-up call:  Unable to leave message

## 2021-05-03 NOTE — Telephone Encounter (Signed)
Transition Care Management Unsuccessful Follow-up Telephone Call  Date of discharge and from where:  05/01/2021 [name]  Attempts:  2nd Attempt  Reason for unsuccessful TCM follow-up call:  Unable to leave message

## 2021-05-04 NOTE — Telephone Encounter (Signed)
Transition Care Management Unsuccessful Follow-up Telephone Call  Date of discharge and from where:  05/01/2021 [name]  Attempts:  3rd Attempt  Reason for unsuccessful TCM follow-up call:  Unable to leave message

## 2021-05-05 LAB — CYTOLOGY - PAP

## 2021-05-07 ENCOUNTER — Other Ambulatory Visit: Payer: Self-pay

## 2021-05-07 ENCOUNTER — Encounter (HOSPITAL_COMMUNITY): Payer: Self-pay | Admitting: Emergency Medicine

## 2021-05-07 ENCOUNTER — Emergency Department (HOSPITAL_COMMUNITY)
Admission: EM | Admit: 2021-05-07 | Discharge: 2021-05-08 | Disposition: A | Discharge status: Home / Self Care | Payer: Medicaid Other | Attending: Emergency Medicine | Admitting: Emergency Medicine

## 2021-05-07 DIAGNOSIS — R569 Unspecified convulsions: Secondary | ICD-10-CM | POA: Diagnosis not present

## 2021-05-07 DIAGNOSIS — Z91040 Latex allergy status: Secondary | ICD-10-CM | POA: Diagnosis not present

## 2021-05-07 DIAGNOSIS — R112 Nausea with vomiting, unspecified: Secondary | ICD-10-CM | POA: Insufficient documentation

## 2021-05-07 DIAGNOSIS — Z79899 Other long term (current) drug therapy: Secondary | ICD-10-CM | POA: Insufficient documentation

## 2021-05-07 DIAGNOSIS — G40909 Epilepsy, unspecified, not intractable, without status epilepticus: Secondary | ICD-10-CM | POA: Diagnosis not present

## 2021-05-07 DIAGNOSIS — R251 Tremor, unspecified: Secondary | ICD-10-CM | POA: Diagnosis not present

## 2021-05-07 DIAGNOSIS — R42 Dizziness and giddiness: Secondary | ICD-10-CM | POA: Insufficient documentation

## 2021-05-07 DIAGNOSIS — R000 Tachycardia, unspecified: Secondary | ICD-10-CM | POA: Diagnosis not present

## 2021-05-07 DIAGNOSIS — R404 Transient alteration of awareness: Secondary | ICD-10-CM | POA: Diagnosis not present

## 2021-05-07 LAB — CBC WITH DIFFERENTIAL/PLATELET
Abs Immature Granulocytes: 0.02 10*3/uL (ref 0.00–0.07)
Basophils Absolute: 0 10*3/uL (ref 0.0–0.1)
Basophils Relative: 1 %
Eosinophils Absolute: 0 10*3/uL (ref 0.0–0.5)
Eosinophils Relative: 1 %
HCT: 41.2 % (ref 36.0–46.0)
Hemoglobin: 13 g/dL (ref 12.0–15.0)
Immature Granulocytes: 0 %
Lymphocytes Relative: 24 %
Lymphs Abs: 1.4 10*3/uL (ref 0.7–4.0)
MCH: 29 pg (ref 26.0–34.0)
MCHC: 31.6 g/dL (ref 30.0–36.0)
MCV: 91.8 fL (ref 80.0–100.0)
Monocytes Absolute: 0.4 10*3/uL (ref 0.1–1.0)
Monocytes Relative: 7 %
Neutro Abs: 3.9 10*3/uL (ref 1.7–7.7)
Neutrophils Relative %: 67 %
Platelets: 327 10*3/uL (ref 150–400)
RBC: 4.49 MIL/uL (ref 3.87–5.11)
RDW: 12.9 % (ref 11.5–15.5)
WBC: 5.7 10*3/uL (ref 4.0–10.5)
nRBC: 0 % (ref 0.0–0.2)

## 2021-05-07 LAB — I-STAT BETA HCG BLOOD, ED (MC, WL, AP ONLY): I-stat hCG, quantitative: <5 m[IU]/mL (ref <5)

## 2021-05-07 MED ORDER — LACTATED RINGERS IV BOLUS
1000.0000 mL | Freq: Once | INTRAVENOUS | Status: AC
  Administered 2021-05-07: 1000 mL via INTRAVENOUS

## 2021-05-07 MED ORDER — LAMOTRIGINE 100 MG PO TABS: 100.0000 mg | ORAL_TABLET | Freq: Every day | ORAL | Status: DC

## 2021-05-07 NOTE — ED Triage Notes (Signed)
Per EMS, pt from home w/ [name], found pt prone on bed unresponsive w/ snoring respirations.  [name] reports witnessing 2 seizures both lasting less than 5 minutes. Pt now communicating w/ staff.  Only injury is that it appears she bit her bottom lip.  Pt has not been compliant w/ medications.    CBG 114 138/90 HR 120 99% RA  Unable to obtain IV access

## 2021-05-08 ENCOUNTER — Telehealth: Payer: Self-pay | Admitting: Family Medicine

## 2021-05-08 LAB — BASIC METABOLIC PANEL
Anion gap: 11 (ref 5–15)
BUN: 8 mg/dL (ref 6–20)
CO2: 22 mmol/L (ref 22–32)
Calcium: 10 mg/dL (ref 8.9–10.3)
Chloride: 106 mmol/L (ref 98–111)
Creatinine, Ser: 0.81 mg/dL (ref 0.44–1.00)
GFR, Estimated: >60 mL/min (ref >60)
Glucose, Bld: 114 mg/dL — ABNORMAL HIGH (ref 70–99)
Potassium: 3.5 mmol/L (ref 3.5–5.1)
Sodium: 139 mmol/L (ref 135–145)

## 2021-05-08 MED ORDER — ONDANSETRON HCL 4 MG/2ML IJ SOLN
4.0000 mg | Freq: Once | INTRAMUSCULAR | Status: AC
  Administered 2021-05-08: 4 mg via INTRAVENOUS
  Filled 2021-05-08: qty 2

## 2021-05-08 MED ORDER — ACETAMINOPHEN 500 MG PO TABS
1000.0000 mg | ORAL_TABLET | Freq: Once | ORAL | Status: AC
  Administered 2021-05-08: 1000 mg via ORAL
  Filled 2021-05-08: qty 2

## 2021-05-08 NOTE — ED Notes (Signed)
Pt discharged and wheeled out of the ED in a wheel chair without difficulty. 

## 2021-05-08 NOTE — ED Provider Notes (Signed)
[facility] EMERGENCY DEPARTMENT Provider Note   CSN: [id number] Arrival date & time: 05/07/21  2247     History Chief Complaint  Patient presents with   Seizures    [name] is a 20 y.o. female.  Patient is a 20 year old female with a history of seizures currently on Lamictal who is presenting today with her family member because of 2 or 3 seizures at home.  [name] found patient prone on her bed unresponsive and snoring and then reported that she saw her have 2 separate seizures that lasted less than 5 minutes.  They reported she was shaking and then started snoring and took some time to wake up.  Patient does not remember any of this.  She does report that this last week she has been taking doxycycline twice daily for an STI.  She has had episodes of dizziness, nausea and vomiting intermittently but has not been aware of having a fever.  She denies any dysuria or discharge currently.  She has no abdominal pain.  She denies recent cough cold or congestion symptoms.  She reports compliance with the Lamictal but reports it is a newer medication.  She is taking 100 mg twice daily.  The history is provided by the patient and a relative.  Seizures     Past Medical History:  Diagnosis Date   Seizure (HCC)    Seizures (HCC)    Well child check 04/26/2011    Patient Active Problem List   Diagnosis Date Noted   Seizure (HCC) 01/28/2021   Anemia    Strep throat    Encounter for Nexplanon removal 11/04/2020   Family history of brain cancer 05/20/2015   Seizures (HCC) 10/31/2006    Past Surgical History:  Procedure Laterality Date   LEG SURGERY       OB History   No obstetric history on file.     Family History  Problem Relation Age of Onset   Healthy Mother    Seizures Father     Social History   Tobacco Use   Smoking status: Never   Smokeless tobacco: Never  Substance Use Topics   Alcohol use: No   Drug use: No    Home Medications Prior  to Admission medications   Medication Sig Start Date End Date Taking? Authorizing Provider  metroNIDAZOLE (FLAGYL) 500 MG tablet Take 1 tablet (500 mg total) by mouth 2 (two) times daily. 02/01/21   Merrilee Jansky, MD  amoxicillin-clavulanate (AUGMENTIN) 875-125 MG tablet Take 1 tablet by mouth 2 (two) times daily. One po bid x 7 days 04/15/21   Mesner, Barbara Cower, MD  benzonatate (TESSALON) 200 MG capsule Take 1 capsule (200 mg total) by mouth every 8 (eight) hours. 01/28/21   Wieters, Hallie C, [name]  Cetirizine HCl 10 MG CAPS Take 1 capsule (10 mg total) by mouth daily for 10 days. 01/28/21 02/07/21  Wieters, Hallie C, [name]  doxycycline (VIBRA-TABS) 100 MG tablet Take 1 tablet (100 mg total) by mouth 2 (two) times daily for 7 days. 05/01/21 05/08/21  Cora Collum, DO  lamoTRIgine (LAMICTAL) 100 MG tablet Take 1 tablet (100 mg total) by mouth 2 (two) times daily. 04/26/21   Cora Collum, DO  lamoTRIgine (LAMICTAL) 25 MG tablet Take 2 tablets (50 mg total) by mouth daily AND 1 tablet (25 mg total) at bedtime. 02/13/21   Derrel Nip, MD  lamoTRIgine (LAMICTAL) 25 MG tablet Take 2 tablets (50 mg total) by mouth 2 (two) times  daily for 7 days, THEN 3 tablets (75 mg total) 2 (two) times daily for 14 days, THEN 4 tablets (100 mg total) 2 (two) times daily for 21 days. Week 4: Take 50mg  in am, 50mg  in pm  Week 5: Take 75mg  in am, 50mg  in pm Week 6: Take 75mg  in am, 75mg  in pm Week 7: 100mg  am and 75mg  pm  Week 8 and 9: 100mg  am and pm. 03/08/21 04/19/21  , DO  lamoTRIgine (LAMICTAL) 25 MG tablet Take 2 tablets (50 mg total) by mouth 2 (two) times daily. 04/13/21 05/13/21  , DO  levETIRAcetam (KEPPRA) 500 MG tablet Take 1 tablet (500 mg total) by mouth 2 (two) times daily. 01/29/21   , MD  norgestimate-ethinyl estradiol (SPRINTEC 28) 0.25-35 MG-MCG tablet Take 1 tablet by mouth daily. 03/08/21   04/21/21, DO  ondansetron (ZOFRAN ODT) 4 MG disintegrating  tablet Take 1 tablet (4 mg total) by mouth every 8 (eight) hours as needed for nausea or vomiting. 05/01/21   06/13/21, [name]  ondansetron (ZOFRAN) 4 MG tablet Take 1 tablet (4 mg total) by mouth every 6 (six) hours. 03/03/21   Cora Collum, NP  loratadine (CLARITIN) 10 MG tablet Take 1 tablet (10 mg total) by mouth daily. 10/15/18 01/28/21  05-20-1999, MD    Allergies    Codeine, Latex, and Tdap [tetanus-diphth-acell pertussis]  Review of Systems   Review of Systems  Neurological:  Positive for seizures.  All other systems reviewed and are negative.  Physical Exam Updated Vital Signs BP 118/81   Pulse (!) 111   Temp 98.1 F (36.7 C) (Oral)   Resp (!) 22   Ht 5\' 4"  (1.626 m)   Wt 50.6 kg   LMP 04/12/2021 Comment: pt shielded  SpO2 100%   BMI 19.15 kg/m   Physical Exam Vitals and nursing note reviewed.  Constitutional:      General: She is not in acute distress.    Appearance: Normal appearance. She is well-developed.  HENT:     Head: Normocephalic and atraumatic.     Mouth/Throat:     Comments: Bite marks and swelling to the lower lip Eyes:     Pupils: Pupils are equal, round, and reactive to light.  Cardiovascular:     Rate and Rhythm: Regular rhythm. Tachycardia present.     Heart sounds: Normal heart sounds. No murmur heard.   No friction rub.  Pulmonary:     Effort: Pulmonary effort is normal.     Breath sounds: Normal breath sounds. No wheezing or rales.  Abdominal:     General: Bowel sounds are normal. There is no distension.     Palpations: Abdomen is soft.     Tenderness: There is no abdominal tenderness. There is no guarding or rebound.  Musculoskeletal:        General: No tenderness. Normal range of motion.     Cervical back: Normal range of motion and neck supple.     Right lower leg: No edema.     Left lower leg: No edema.     Comments: No edema  Skin:    General: Skin is warm and dry.     Findings: No rash.  Neurological:     Mental  Status: She is alert and oriented to person, place, and time.     Cranial Nerves: No cranial nerve deficit.     Sensory: No sensory deficit.     Motor:  No weakness.     Comments: Occasionally has to repeat words but can self-correct  Psychiatric:        Mood and Affect: Mood normal.        Behavior: Behavior normal.    ED Results / Procedures / Treatments   Labs (all labs ordered are listed, but only abnormal results are displayed) Labs Reviewed  BASIC METABOLIC PANEL - Abnormal; Notable for the following components:      Result Value   Glucose, Bld 114 (*)    All other components within normal limits  CBC WITH DIFFERENTIAL/PLATELET  I-STAT BETA HCG BLOOD, ED ([facility], [facility], [facility] ONLY)    EKG None  Radiology No results found.  Procedures Procedures   Medications Ordered in ED Medications  lamoTRIgine (LAMICTAL) tablet 100 mg (has no administration in time range)  lactated ringers bolus 1,000 mL (1,000 mLs Intravenous New Bag/Given 05/07/21 2342)    ED Course  I have reviewed the triage vital signs and the nursing notes.  Pertinent labs & imaging results that were available during my care of the patient were reviewed by me and considered in my medical decision making (see chart for details).    MDM Rules/Calculators/A&P                           Patient presenting today after multiple seizures at home.  Last witnessed seizures before today were last Wednesday.  Patient has recently started Lamictal and today reports compliance.  She is also been taking doxycycline for recent STIs.  She has had some dizziness, nausea and vomiting over the last week since being on the antibiotic.  Unclear if this lowered her seizure threshold.  Currently she is well-appearing but is tachycardic.  Afebrile today.  Seems to be back at baseline.  hCG today is less than 5, CBC within normal limits, BMP within normal limits.  Patient given an oral dose of her Lamictal and IV fluids.  1:57 AM Pt feeling  better.  Tolerating po's.  No further vomiting.  Was able to ambulate to the bathroom.   Will d/c home with grandma.  HR improved to the 90's.  MDM   Amount and/or Complexity of Data Reviewed Clinical lab tests: ordered and reviewed Independent visualization of images, tracings, or specimens: yes     Final Clinical Impression(s) / ED Diagnoses Final diagnoses:  Seizure [facility])    Rx / DC Orders ED Discharge Orders     None        Gwyneth Sprout, MD 05/08/21 0200

## 2021-05-08 NOTE — Discharge Instructions (Addendum)
You can go to the health department in 1 week.  05/15/21 or later to recheck to make sure the STI is gone.  Avoid sex until then.  The antibiotic may have been making you sick but now that you are off it you should start feeling better.  Continue taking you lamictal but if you still have seizures you should follow up with your seizure doctor the neurologist.  Return for any further problems.

## 2021-05-08 NOTE — Telephone Encounter (Deleted)
Received page from after hours emergency line, no response, left voicemail.

## 2021-05-09 ENCOUNTER — Telehealth: Payer: Self-pay

## 2021-05-09 NOTE — Progress Notes (Addendum)
     SUBJECTIVE:   CHIEF COMPLAINT / HPI:   [name] is a 20 y.o. female presents for hospital follow up  Presents today with her grandmother  Hospital follow up Seizure disorder Pt has hx of seizure disorder since she was 2 months old. She is currently on Lamictal 100mg  BID which was started by neurology 1 month ago. She had 2-3 witnessed seizures on 9/5 and taken to the ED. Recently took doxycyline for STDs. Unclear whether this reduced the seizure threshold. In the ED: CBC, BMP wnl. She received Lamictal and IV fluids. Today pt had 3 witnessed seizures this morning. She was in her bed, she was shaking 'bad', urinary incontinence, eyes were open and drooling. Lasted approx 10 mins. Post ictally today she was staring into space. [name] stopped Lamictal 2 days ago due to side effects: depression, anxiety, swelling of face, weakness etc. She has also been confused and vomiting all week recently. Usually post ictal she is very sleepy and goes to sleep. 11/5 is unsure when she last saw neurology but her next appointment with them is in October.  LMP: currently  PERTINENT  PMH / PSH: seizure disorder  OBJECTIVE:   BP 127/89   Pulse (!) 104   Wt 105 lb 6 oz (47.8 kg)   LMP 04/12/2021 Comment: pt shielded  SpO2 100%   BMI 18.09 kg/m    General: Alert, staring, unresponsive to words HEENT: NCAT, PERRLA 27mm bilaterally  Cardio: Normal S1 and S2, RRR, no r/m/g Pulm: CTAB, normal work of breathing Extremities: No peripheral edema.  Neuro: unable to assess  ASSESSMENT/PLAN:   Seizures Patient had a witnessed seizure Vs pseudoseizure in clinic today.  This was in the setting of being off her home Lamictal.When the pt initially came in she was alert sitting in her wheelchair, staring into space and unresponsive to words. Within 5-10 mins of the encounter after I stepped outside the room, she had generalized shaking with her eyes closed which lasted a few minutes.  Per grandma her  last dose was 2 days ago. Vital signs wnl except HR 104. CBG 177. Her post ictal state was back to her staring. She is now alert and unresponsive to words. EMS called and pt was sent to the ER. I have notified the ER and FPTS team of likely admission. Will need labs, EEG and neurology consult in hospital.    7-10, MD PGY-3 [facility]

## 2021-05-09 NOTE — Telephone Encounter (Signed)
Transition Care Management Unsuccessful Follow-up Telephone Call  Date of discharge and from where:  05/08/2021-[place]   Attempts:  1st Attempt  Reason for unsuccessful TCM follow-up call:  Left voice message

## 2021-05-10 ENCOUNTER — Encounter (HOSPITAL_COMMUNITY): Payer: Self-pay | Admitting: Family Medicine

## 2021-05-10 ENCOUNTER — Telehealth: Payer: Self-pay | Admitting: Family Medicine

## 2021-05-10 ENCOUNTER — Other Ambulatory Visit: Payer: Self-pay

## 2021-05-10 ENCOUNTER — Ambulatory Visit (INDEPENDENT_AMBULATORY_CARE_PROVIDER_SITE_OTHER): Payer: Medicaid Other | Admitting: Family Medicine

## 2021-05-10 ENCOUNTER — Inpatient Hospital Stay (HOSPITAL_COMMUNITY): Payer: Medicaid Other

## 2021-05-10 ENCOUNTER — Inpatient Hospital Stay (HOSPITAL_COMMUNITY)
Admission: EM | Admit: 2021-05-10 | Discharge: 2021-05-13 | DRG: 101 | Disposition: A | Discharge status: Home / Self Care | Payer: Medicaid Other | Attending: Family Medicine | Admitting: Family Medicine

## 2021-05-10 ENCOUNTER — Encounter: Payer: Self-pay | Admitting: Family Medicine

## 2021-05-10 VITALS — BP 127/89 | HR 104 | Wt 105.4 lb

## 2021-05-10 DIAGNOSIS — D649 Anemia, unspecified: Secondary | ICD-10-CM | POA: Diagnosis present

## 2021-05-10 DIAGNOSIS — G40909 Epilepsy, unspecified, not intractable, without status epilepticus: Secondary | ICD-10-CM | POA: Diagnosis not present

## 2021-05-10 DIAGNOSIS — R569 Unspecified convulsions: Secondary | ICD-10-CM

## 2021-05-10 DIAGNOSIS — E860 Dehydration: Secondary | ICD-10-CM | POA: Diagnosis present

## 2021-05-10 DIAGNOSIS — E876 Hypokalemia: Secondary | ICD-10-CM | POA: Diagnosis not present

## 2021-05-10 DIAGNOSIS — Z781 Physical restraint status: Secondary | ICD-10-CM | POA: Diagnosis not present

## 2021-05-10 DIAGNOSIS — Z20822 Contact with and (suspected) exposure to covid-19: Secondary | ICD-10-CM | POA: Diagnosis present

## 2021-05-10 DIAGNOSIS — Z79899 Other long term (current) drug therapy: Secondary | ICD-10-CM

## 2021-05-10 DIAGNOSIS — Z91040 Latex allergy status: Secondary | ICD-10-CM | POA: Diagnosis not present

## 2021-05-10 DIAGNOSIS — F23 Brief psychotic disorder: Secondary | ICD-10-CM

## 2021-05-10 DIAGNOSIS — Z9114 Patient's other noncompliance with medication regimen: Secondary | ICD-10-CM | POA: Diagnosis not present

## 2021-05-10 DIAGNOSIS — F29 Unspecified psychosis not due to a substance or known physiological condition: Secondary | ICD-10-CM | POA: Diagnosis not present

## 2021-05-10 DIAGNOSIS — Z885 Allergy status to narcotic agent status: Secondary | ICD-10-CM

## 2021-05-10 DIAGNOSIS — Z887 Allergy status to serum and vaccine status: Secondary | ICD-10-CM | POA: Diagnosis not present

## 2021-05-10 DIAGNOSIS — R000 Tachycardia, unspecified: Secondary | ICD-10-CM | POA: Diagnosis not present

## 2021-05-10 LAB — CBC WITH DIFFERENTIAL/PLATELET
Abs Immature Granulocytes: 0.02 10*3/uL (ref 0.00–0.07)
Basophils Absolute: 0 10*3/uL (ref 0.0–0.1)
Basophils Relative: 1 %
Eosinophils Absolute: 0.1 10*3/uL (ref 0.0–0.5)
Eosinophils Relative: 1 %
HCT: 38.6 % (ref 36.0–46.0)
Hemoglobin: 12.1 g/dL (ref 12.0–15.0)
Immature Granulocytes: 1 %
Lymphocytes Relative: 25 %
Lymphs Abs: 1.1 10*3/uL (ref 0.7–4.0)
MCH: 28.7 pg (ref 26.0–34.0)
MCHC: 31.3 g/dL (ref 30.0–36.0)
MCV: 91.5 fL (ref 80.0–100.0)
Monocytes Absolute: 0.4 10*3/uL (ref 0.1–1.0)
Monocytes Relative: 9 %
Neutro Abs: 2.8 10*3/uL (ref 1.7–7.7)
Neutrophils Relative %: 63 %
Platelets: 333 10*3/uL (ref 150–400)
RBC: 4.22 MIL/uL (ref 3.87–5.11)
RDW: 12.8 % (ref 11.5–15.5)
WBC: 4.3 10*3/uL (ref 4.0–10.5)
nRBC: 0 % (ref 0.0–0.2)

## 2021-05-10 LAB — COMPREHENSIVE METABOLIC PANEL
ALT: 13 U/L (ref 0–44)
AST: 20 U/L (ref 15–41)
Albumin: 4 g/dL (ref 3.5–5.0)
Alkaline Phosphatase: 48 U/L (ref 38–126)
Anion gap: 9 (ref 5–15)
BUN: 8 mg/dL (ref 6–20)
CO2: 27 mmol/L (ref 22–32)
Calcium: 9.6 mg/dL (ref 8.9–10.3)
Chloride: 103 mmol/L (ref 98–111)
Creatinine, Ser: 0.75 mg/dL (ref 0.44–1.00)
GFR, Estimated: >60 mL/min (ref >60)
Glucose, Bld: 111 mg/dL — ABNORMAL HIGH (ref 70–99)
Potassium: 3.3 mmol/L — ABNORMAL LOW (ref 3.5–5.1)
Sodium: 139 mmol/L (ref 135–145)
Total Bilirubin: 0.3 mg/dL (ref 0.3–1.2)
Total Protein: 7.2 g/dL (ref 6.5–8.1)

## 2021-05-10 LAB — TSH: TSH: 1.234 u[IU]/mL (ref 0.350–4.500)

## 2021-05-10 LAB — ACETAMINOPHEN LEVEL: Acetaminophen (Tylenol), Serum: <10 ug/mL — ABNORMAL LOW (ref 10–30)

## 2021-05-10 LAB — GLUCOSE, POCT (MANUAL RESULT ENTRY): POC Glucose: 171 mg/dl — AB (ref 70–99)

## 2021-05-10 LAB — ETHANOL: Alcohol, Ethyl (B): <10 mg/dL (ref <10)

## 2021-05-10 MED ORDER — ENOXAPARIN SODIUM 40 MG/0.4ML IJ SOSY
40.0000 mg | PREFILLED_SYRINGE | INTRAMUSCULAR | Status: DC
  Administered 2021-05-11 – 2021-05-12 (×2): 40 mg via SUBCUTANEOUS
  Filled 2021-05-10 (×2): qty 0.4

## 2021-05-10 MED ORDER — MIDAZOLAM HCL 2 MG/2ML IJ SOLN: 1.0000 mg | Freq: Four times a day (QID) | INTRAMUSCULAR | Status: DC | PRN

## 2021-05-10 MED ORDER — SODIUM CHLORIDE 0.9 % IV BOLUS
1000.0000 mL | Freq: Once | INTRAVENOUS | Status: AC
  Administered 2021-05-10: 1000 mL via INTRAVENOUS

## 2021-05-10 MED ORDER — LEVETIRACETAM IN NACL 1000 MG/100ML IV SOLN
1000.0000 mg | Freq: Once | INTRAVENOUS | Status: AC
  Administered 2021-05-10: 1000 mg via INTRAVENOUS
  Filled 2021-05-10: qty 100

## 2021-05-10 MED ORDER — SODIUM CHLORIDE 0.9 % IV SOLN: INTRAVENOUS | Status: DC

## 2021-05-10 MED ORDER — POTASSIUM CHLORIDE CRYS ER 20 MEQ PO TBCR
40.0000 meq | EXTENDED_RELEASE_TABLET | Freq: Once | ORAL | Status: AC
  Administered 2021-05-10: 40 meq via ORAL
  Filled 2021-05-10: qty 2

## 2021-05-10 MED ORDER — ACETAMINOPHEN 650 MG RE SUPP: 650.0000 mg | Freq: Four times a day (QID) | RECTAL | Status: DC | PRN

## 2021-05-10 MED ORDER — LEVETIRACETAM 500 MG PO TABS: 500.0000 mg | ORAL_TABLET | Freq: Two times a day (BID) | ORAL | Status: DC

## 2021-05-10 MED ORDER — ACETAMINOPHEN 325 MG PO TABS: 650.0000 mg | ORAL_TABLET | Freq: Four times a day (QID) | ORAL | Status: DC | PRN

## 2021-05-10 MED ORDER — LORAZEPAM BOLUS VIA INFUSION: 1.0000 mg | INTRAVENOUS | Status: DC | PRN

## 2021-05-10 MED ORDER — LORAZEPAM 2 MG/ML IJ SOLN: 1.0000 mg | INTRAMUSCULAR | Status: DC | PRN

## 2021-05-10 MED ORDER — LORAZEPAM 2 MG/ML IJ SOLN
1.0000 mg | Freq: Once | INTRAMUSCULAR | Status: AC
  Administered 2021-05-10: 1 mg via INTRAVENOUS
  Filled 2021-05-10: qty 1

## 2021-05-10 NOTE — Telephone Encounter (Signed)
After hours emergency line-9/5  Received page from after hours emergency line, no response, left voicemail.   *Late documentation as it was entered in error in another patient's chart with same name and age.

## 2021-05-10 NOTE — ED Triage Notes (Signed)
Pt here from  MD office for an apparent seizure , hx of same, pt awake and alert on arrival to the ED

## 2021-05-10 NOTE — ED Provider Notes (Signed)
[facility] EMERGENCY DEPARTMENT Provider Note   CSN: [id number] Arrival date & time: 05/10/21  7253     History No chief complaint on file.   [name] is a 20 y.o. female.  HPI  Patient presents with her grandmother who provides much of the history.  The patient speaks quietly, is withdrawn, does add some details, but much of the recent history is provided by the grandmother.  It seems as though the patient has a history of seizures and seizure-like activity.  She has previously been on Depakote, and is most recently on Lamictal.  Grandmother notes that over the past 5 or 6 days she has had increasing frequency of seizure, and with unusual behavior in general she recently stopped the antiepileptic medication regimen. No report of substantial fall, trauma.  The patient indicates that she has some pain in her tongue, otherwise seems to deny pain anywhere.  No other recent medication change.  Granddaughter may have been hallucinating as well, and with ongoing seizures, seizure-like episodes, and change in interactivity she was brought to the clinic today.  Clinic staff sent the patient here for further evaluation.  Additional details on her chart.  This is the patient's 11th ED visit in 6 months, third in 1 month.  Past Medical History:  Diagnosis Date   Seizure (HCC)    Seizures (HCC)    Well child check 04/26/2011    Patient Active Problem List   Diagnosis Date Noted   Seizure (HCC) 01/28/2021   Anemia    Strep throat    Encounter for Nexplanon removal 11/04/2020   Family history of brain cancer 05/20/2015   Seizures (HCC) 10/31/2006    Past Surgical History:  Procedure Laterality Date   LEG SURGERY       OB History   No obstetric history on file.     Family History  Problem Relation Age of Onset   Healthy Mother    Seizures Father     Social History   Tobacco Use   Smoking status: Never   Smokeless tobacco: Never  Substance Use Topics    Alcohol use: No   Drug use: No    Home Medications Prior to Admission medications   Medication Sig Start Date End Date Taking? Authorizing Provider  metroNIDAZOLE (FLAGYL) 500 MG tablet Take 1 tablet (500 mg total) by mouth 2 (two) times daily. 02/01/21   Merrilee Jansky, MD  amoxicillin-clavulanate (AUGMENTIN) 875-125 MG tablet Take 1 tablet by mouth 2 (two) times daily. One po bid x 7 days 04/15/21   Mesner, Barbara Cower, MD  benzonatate (TESSALON) 200 MG capsule Take 1 capsule (200 mg total) by mouth every 8 (eight) hours. 01/28/21   Wieters, Hallie C, [name]  Cetirizine HCl 10 MG CAPS Take 1 capsule (10 mg total) by mouth daily for 10 days. 01/28/21 02/07/21  Wieters, Hallie C, [name]  lamoTRIgine (LAMICTAL) 100 MG tablet Take 1 tablet (100 mg total) by mouth 2 (two) times daily. 04/26/21   Cora Collum, DO  lamoTRIgine (LAMICTAL) 25 MG tablet Take 2 tablets (50 mg total) by mouth daily AND 1 tablet (25 mg total) at bedtime. 02/13/21   Derrel Nip, MD  lamoTRIgine (LAMICTAL) 25 MG tablet Take 2 tablets (50 mg total) by mouth 2 (two) times daily for 7 days, THEN 3 tablets (75 mg total) 2 (two) times daily for 14 days, THEN 4 tablets (100 mg total) 2 (two) times daily for 21 days. Week 4: Take 50mg   in am, 50mg  in pm  Week 5: Take 75mg  in am, 50mg  in pm Week 6: Take 75mg  in am, 75mg  in pm Week 7: 100mg  am and 75mg  pm  Week 8 and 9: 100mg  am and pm. 03/08/21 04/19/21  , DO  lamoTRIgine (LAMICTAL) 25 MG tablet Take 2 tablets (50 mg total) by mouth 2 (two) times daily. 04/13/21 05/13/21  , DO  levETIRAcetam (KEPPRA) 500 MG tablet Take 1 tablet (500 mg total) by mouth 2 (two) times daily. 01/29/21   , MD  norgestimate-ethinyl estradiol (SPRINTEC 28) 0.25-35 MG-MCG tablet Take 1 tablet by mouth daily. 03/08/21   Cora Collum, DO  ondansetron (ZOFRAN ODT) 4 MG disintegrating tablet Take 1 tablet (4 mg total) by mouth every 8 (eight) hours as needed for nausea or  vomiting. 05/01/21   07/13/21, [name]  ondansetron (ZOFRAN) 4 MG tablet Take 1 tablet (4 mg total) by mouth every 6 (six) hours. 03/03/21   01/31/21, NP  loratadine (CLARITIN) 10 MG tablet Take 1 tablet (10 mg total) by mouth daily. 10/15/18 01/28/21  05/09/21, MD    Allergies    Codeine, Latex, and Tdap [tetanus-diphth-acell pertussis]  Review of Systems   Review of Systems  Constitutional:        Per HPI, otherwise negative  HENT:         Per HPI, otherwise negative  Respiratory:         Per HPI, otherwise negative  Cardiovascular:        Per HPI, otherwise negative  Gastrointestinal:  Negative for vomiting.  Endocrine:       Negative aside from HPI  Genitourinary:        Neg aside from HPI   Musculoskeletal:        Per HPI, otherwise negative  Skin: Negative.   Neurological:  Positive for seizures (As well as seizure-like episodes). Negative for syncope.   Physical Exam Updated Vital Signs BP (!) 148/83   Pulse (!) 118   Temp 98.6 F (37 C) (Oral)   Resp (!) 31   LMP 04/12/2021 Comment: pt shielded  SpO2 100%   Physical Exam Vitals and nursing note reviewed.  Constitutional:      General: She is not in acute distress.    Appearance: She is well-developed.  HENT:     Head: Normocephalic and atraumatic.   Eyes:     Conjunctiva/sclera: Conjunctivae normal.  Cardiovascular:     Rate and Rhythm: Normal rate and regular rhythm.  Pulmonary:     Effort: Pulmonary effort is normal. No respiratory distress.     Breath sounds: Normal breath sounds. No stridor.  Abdominal:     General: There is no distension.  Skin:    General: Skin is warm and dry.  Neurological:     Mental Status: She is alert and oriented to person, place, and time.     Cranial Nerves: No cranial nerve deficit.     Comments: No ongoing seizure-like activity, patient moves all extremity spontaneously, has some idiosyncratic movement.  Speech is minimal, very soft, appropriate when  offered.  Psychiatric:        Mood and Affect: Affect is flat.        Behavior: Behavior is slowed and withdrawn.    ED Results / Procedures / Treatments   Labs (all labs ordered are listed, but only abnormal results are displayed) Labs Reviewed  COMPREHENSIVE METABOLIC PANEL - Abnormal; Notable  for the following components:      Result Value   Potassium 3.3 (*)    Glucose, Bld 111 (*)    All other components within normal limits  RESP PANEL BY RT-PCR (FLU A&B, COVID) ARPGX2  CBC WITH DIFFERENTIAL/PLATELET  URINALYSIS, ROUTINE W REFLEX MICROSCOPIC  PREGNANCY, URINE  LAMOTRIGINE LEVEL    EKG None  Radiology No results found.  Procedures Procedures   Medications Ordered in ED Medications - No data to display  ED Course  I have reviewed the triage vital signs and the nursing notes.  Pertinent labs & imaging results that were available during my care of the patient were reviewed by me and considered in my medical decision making (see chart for details).   2:15 PM Initial studies reassuring.  I discussed her case with our neurology colleagues, and family practice for admission. She has had 1 episode of shaking, seizure-like activity, though more consistent with panic.  Vital signs remain somewhat reassuring, though with current episode of tachycardia noted. MDM Rules/Calculators/A&P MDM Number of Diagnoses or Management Options Seizure-like activity (HCC): established, worsening   Amount and/or Complexity of Data Reviewed Clinical lab tests: ordered and reviewed Tests in the radiology section of CPT: ordered and reviewed Tests in the medicine section of CPT: reviewed and ordered Decide to obtain previous medical records or to obtain history from someone other than the patient: yes Obtain history from someone other than the patient: yes Review and summarize past medical records: yes Discuss the patient with other providers: yes Independent visualization of images,  tracings, or specimens: yes  Risk of Complications, Morbidity, and/or Mortality Presenting problems: high Diagnostic procedures: high Management options: high  Critical Care Total time providing critical care: < 30 minutes  Patient Progress Patient progress: stable   Final Clinical Impression(s) / ED Diagnoses Final diagnoses:  Seizure-like activity (HCC)     Gerhard Munch, MD 05/10/21 1417

## 2021-05-10 NOTE — Consult Note (Signed)
Neurology Consultation Reason for Consult: seizure Referring Physician: Dr Gerhard Munch  CC: seizure  History is obtained from:patient, mother on phone, grandmother at bedside, chart review  HPI: [name] is a 20 y.o. female with history of epilepsy who presented with multiple seizure-like episodes.  Patient is predominantly staring at her phone and not providing any history, giving monosyllabic responses.  Per grandmother at bedside patient has been having multiple (2-4) episodes of staring and not responding concerning for seizures.  Grandmother states at baseline she has history of generalized tonic-clonic seizures which were well controlled on Depakote.  However it was recently switched to lamotrigine as patient expressed interest in having children.  Per family, since being on lamotrigine patient has had multiple generalized tonic-clonic seizures as well as these new staring spells.  Patient has also been reporting nausea, dizziness as well as visual hallucinations.  Therefore, family stopped lamotrigine couple of days ago.  Today patient had a seizure while she was seen at family practice physician and was referred to emergency room for further management.  ROS: All other systems reviewed and negative except as noted in the HPI.   Past Medical History:  Diagnosis Date   Seizure (HCC)    Seizures (HCC)    Well child check 04/26/2011    Family History  Problem Relation Age of Onset   Healthy Mother    Seizures Father     Social History:  reports that she has never smoked. She has never used smokeless tobacco. She reports that she does not drink alcohol and does not use drugs.   Exam: Current vital signs: BP (!) 148/83   Pulse (!) 118   Temp 98.6 F (37 C) (Oral)   Resp (!) 31   LMP 04/12/2021 Comment: pt shielded  SpO2 100%  Vital signs in last 24 hours: Temp:  [98.6 F (37 C)] 98.6 F (37 C) (09/07 0950) Pulse Rate:  [104-123] 118 (09/07 1315) Resp:  [16-31] 31  (09/07 1315) BP: (127-157)/(80-89) 148/83 (09/07 1315) SpO2:  [100 %] 100 % (09/07 1315) Weight:  [47.8 kg] 47.8 kg (09/07 0851)   Physical Exam  Constitutional: Appears well-developed and well-nourished.  Psych: Not making eye contact, answering in monosyllables Eyes: No scleral injection HENT: No OP obstrucion Head: Normocephalic.  Cardiovascular: Normal rate and regular rhythm.  Respiratory: Effort normal, non-labored breathing GI: Soft.  No distension. There is no tenderness.  Skin: Warm Neuro: AOx3, cranial nerves II 12 grossly intact, 5/5 in all extremities  I have reviewed labs in epic and the results pertinent to this consultation are: CBC:  Recent Labs  Lab 05/07/21 2314 05/10/21 1005  WBC 5.7 4.3  NEUTROABS 3.9 2.8  HGB 13.0 12.1  HCT 41.2 38.6  MCV 91.8 91.5  PLT 327 333    Basic Metabolic Panel:  Lab Results  Component Value Date   NA 139 05/10/2021   K 3.3 (L) 05/10/2021   CO2 27 05/10/2021   GLUCOSE 111 (H) 05/10/2021   BUN 8 05/10/2021   CREATININE 0.75 05/10/2021   CALCIUM 9.6 05/10/2021   GFRNONAA >60 05/10/2021   GFRAA >60 11/21/2018   Lipid Panel: No results found for: LDLCALC HgbA1c: No results found for: HGBA1C Urine Drug Screen:     Component Value Date/Time   LABOPIA NONE DETECTED 04/30/2021 0037   COCAINSCRNUR NONE DETECTED 04/30/2021 0037   LABBENZ NONE DETECTED 04/30/2021 0037   AMPHETMU NONE DETECTED 04/30/2021 0037   THCU NONE DETECTED 04/30/2021 0037   LABBARB  NONE DETECTED 04/30/2021 0037    Alcohol Level     Component Value Date/Time   [facility] <10 01/29/2021 0017     I have reviewed the images obtained: CT head without contrast 11/20/2020: No acute abnormality   ASSESSMENT/PLAN: 20 year old female with history of epilepsy who presented with breakthrough seizures in the setting of medication noncompliance due to side effects.  Epilepsy with breakthrough seizures Medication side effects -Differential for any staring  spells includes epileptic seizures versus nonepileptic versus medication side effect  Recommendations: -Family initially requested to resume Depakote.  However, patient reported not being on any contraception and is sexually active.  I discussed potential side effects of Depakote on fetus including but not limited to neural tube defects, cognitive impairment. -Patient and family agree to try another AED.  Therefore will load with Keppra 1000 mg and start 500 mg twice daily.  -We will also obtain video EEG for characterization of spells -We will consider MRI brain without contrast after EEG if needed -Lamotrigine level ordered and pending, urinalysis, urine drug screen to look for seizure provoking factors ordered and pending -Continue seizure precautions -As needed IV Versed 2 mg clinical seizure-like activity   Thank you for allowing [place] to participate in the care of this patient. If you have any further questions, please contact  me or neurohospitalist.   Lindie Spruce [facility] neurohospitalist

## 2021-05-10 NOTE — ED Provider Notes (Signed)
Emergency Medicine Provider Triage Evaluation Note  Jaselyn Nahm , a 20 y.o. female  was evaluated in triage.  Pt complains of seizure.  Last dose of lamictal on Monday.  Stopped due to side effects. Review of Systems  Positive: Starring  Negative: Fever   Physical Exam  BP (!) 157/80 (BP Location: Right Arm)   Pulse (!) 123   Temp 98.6 F (37 C) (Oral)   Resp 16   LMP 04/12/2021 Comment: pt shielded  SpO2 100%  Gen:   Awake, no distress   Resp:  Normal effort  MSK:   Moves extremities without difficulty  Other:    Medical Decision Making  Medically screening exam initiated at 10:03 AM.  Appropriate orders placed.  Najah Liverman was informed that the remainder of the evaluation will be completed by another provider, this initial triage assessment does not replace that evaluation, and the importance of remaining in the ED until their evaluation is complete.     Elson Areas, [place] 05/10/21 1004    Gerhard Munch, MD 05/10/21 [phone]

## 2021-05-10 NOTE — Progress Notes (Addendum)
FPTS Brief Progress Note  S: Pt was resting in bed with grandmother at bedside. Per grandmother, the pt has been having hallucinations over the last week since she stopped her lamotrigine. She has been trying to get up and go home.    O: BP (!) 146/80   Pulse (!) 119   Temp 98.6 F (37 C) (Oral)   Resp (!) 26   LMP 04/12/2021 Comment: pt shielded  SpO2 100%   Constitutional: NAD, sitting up in bed, redirectable to answer questions  Respiratory: Normal WOB, CTAB Abdominal: BS present Cardio: RRR Neuro: No evident deficits, was difficult to follow commands but was redirectable. A&O to person, place, and year   A/P: [name] is a 20 yo F who came in for seizure like activity. Neuro is following and we appreciate recommendations. Follow up EEG, MRI, UDS. Will redirect pt as needed throughout the night and order sitter if necessary. Spoke to grandmother about possibility of staying throughout the night to help with patient when she gets confused. Grandmother would like to stay overnight, and we discussed that we will attempt to make that happen. Continue with work up per day team.   Monitor tachycardia closely throughout the night and will further work up if indicated.   Alfredo Martinez, MD 05/10/2021, 9:19 PM PGY-1, [facility] Night Resident  Please page [phone] with questions.

## 2021-05-10 NOTE — Progress Notes (Signed)
LTM EEG hooked up and running - no initial skin breakdown - push button tested - neuro notified. [facility] monitoring.  

## 2021-05-10 NOTE — Hospital Course (Addendum)
[name] is a 20 year old woman to the ED with seizures and symptoms of psychosis after discontinuing lamictal. Past medical history includes seizure disorder since childhood and recently treated chlamydial infection.  Seizure-like activity  Psychosis Patient presented to the ED after having witnessed seizure in her PCP clinic.  She has a longstanding history of seizures.  She had been previously well controlled on Depakote, however started Lamictal approximately 1 month ago as she desired to become pregnant and has continued to have breakthrough seizures.  Additionally Lamictal has caused significant side effects which make it difficult for her to take her medicine. Urine drug screen, ethanol and pregnancy test were negative.  Work-up including her CBC, glucose, TSH, electrolytes noncontributory.  Patient was placed Keppra per neurology, and given as needed Ativan for seizures lasting greater than 5 minutes.  Patient's first overnight EEG did not show any seizure activity, despite patient having symptoms.  Patient became very agitated during her first night of admission, experiencing auditory and visual hallucinations, responding to internal stimuli.  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  Neurology discontinued Keppra due to worsening psychosis profile, and switched to oxcarbazepine.  With her continued disoriented state and negative EEG neurology ordered MRI which showed subtle asymmetric atrophy involving the mesial left temporal lobe and left hippocampus as compared to the right, without any acute intracranial abnormality identified.  This was followed up by another overnight EEG which was within normal limits.  She was placed on 24-hour video surveillance, to better characterize seizure activity episodes, which was normal.  Likely caused by side effects of lamictal. Patient tolerated  oxcarbazepine well and returned to baseline cognition and functioning on 9/9. Neurology outpatient follow up scheduled.  Sinus tachycardia Patient had heart rates of 130s to 170s upon admission.  EKG revealed sinus tachycardia.  Her heart rate stabilized temporarily, but spiked above 100 upon ambulation.  Neurology had concern and primary team ordered echo.  Echo findings WNL. On day of discharge she had mild tachycardia to 100s and was able to tolerate walking without symptoms of dizziness.  Hypokalemia Mild hypokalemia of 3.3 upon admission.  However her potassium corrected to 4 without supplementation.  All other chronic medical conditions stable at time of discharge.  PCP follow-up recommendations 1.)  Follow-up with primary care provider to discuss events of hospitalization, ensure patient doing well with oxcarbazepine 150 mg BID. 2.)  Follow-up with neurology for seizure management. 3.) Discuss birth control options if desired, chlamydia test of cure.

## 2021-05-10 NOTE — ED Notes (Signed)
Pt is A&Ox4. Pt is having visual hallucinations at this time. Pt continues to try to pull cords off. Grandmother at bedside helps with reorienting pt. Warm blanket provided.

## 2021-05-10 NOTE — H&P (Addendum)
[facility] History and Physical Service Pager: [phone]  Patient name: [name] Medical record number: [id number] Date of birth: 12-Aug-2001 Age: 20 y.o. Gender: female  Primary Care Provider: Cora CollumPaige, [name], DO Consultant: Neurology Code Status: Full Preferred Emergency Contact: Grandmother [phone]   Chief Complaint: Seizures  Assessment and Plan: [name] is a 20 y.o. female presenting with 3 witnessed seizures this morning. PMH is significant for seizure disorder.  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  Upon admission blood pressure was 148/83, heart rate 118, respiratory rate 31. CBC noncontributory, glucose of 171, potassium of 3.3.  UDS pending.  Pregnancy test pending.  This is unlikely to be a DKA as patient has normal anion gap and no history of diabetes.  Patient is reproductive female not practicing safe sex, we will order urine pregnancy screening. Patient's electrolytes look fairly stable, will replenish potassium. Given intermittent vomiting that patient endorses, awaiting MRI imaging to ensure no further intracranial pathology. Not likely to be febrile in nature as patient has remained afebrile.  We will rule out drug-induced seizure with UDS.  We will rule out ethanol.  Potential cause is traumatic injury (1 fall several days ago) or mass-effect, we will order MRI to evaluate. It is also possible that patient is subtherapeutic with her lamotrigine, and we will order levels.  Infection unlikely remains if afebrile and CBC is noncontributory, however we ordered respiratory panel to exclude COVID infection. Low concern for infectious etiology including meningitis. Also on the differential, although low, is serotonin syndrome given patient not on any SSRIs. Potential differential also includes syncope given prior  dehydration, consider echo although no prior cardiac history. Very possible symptoms secondary to pseudoseizure given patient's stressors and change in seizure characteristics compared to typical seizures in the past, interesting that patient does not seem to have a post-ictal state with these recent seizures. Still not at baseline. Admit for further workup, neurology consulted, appreciate recommendations and to determine anti-epileptic regimen for patient prior to discharge.  -Admit to FPTS, attending Dr. Miquel DunnPray -Appreciate neurology consult, follow neuro recs -Lovenox -Keppra per neurology -Ativan 1 mg as needed for seizures greater than 5 minutes -f/u lamotrigine level -Head MRI without contrast per neurology -EEG per neurology -f/u UDS -f/u ethanol level -f/u tylenol level  -f/u Upreg -NPO pending speech evaluation, IV NS@75  mL/hr -SLP eval -seizure precautions  -consider echo -will likely need routine neurology outpatient follow up   Hypokalemia Patient's potassium on admission was 3.3, likely secondary to dehydration as grandmother endorses that patient had intermittent vomiting for 2 weeks. Expect this to resolve with supplementation and resolution of symptoms.  -PO 20 mEq Kcl  -BMP am  Elevated blood pressures Noted to have multiple elevated BP, on admission was 157/80. No prior history of hypertension and not on any home medications. Will hold off on starting antihypertensives at this time. -continue to monitor BP   Anemia Patient noted to have history of anemia, unsure of exact etiology, but within normal limits at Hgb 12.1. Patient currently on menstrual cycle. -am CBC  Hallucinations Patient endorsing visual hallucinations, reports seeing green snakes in the ED room. Likely adverse effect of lamictal. -monitor clinically -consider psych consult if persists   Recent chlamydia infection  Tested positive for chlamydia on 04/26/2021, instructed to take 7 day course of  doxycycline. Unsure if patient completed antibiotic course. -consider retest  in 3 weeks outpatient    FEN/GI: N.p.o. pending bedside swallow Prophylaxis: Lovenox  Disposition: Admit to cardiac telemetry, attending Dr. Miquel Dunn  History of Present Illness:  [name] is a 20 y.o. female presenting with witnessed seizure versus pseudoseizure in PCP clinic today. History obtained from grandmother, as patient may be post ictal. [name] was seeing her PCP for 3 witnessed seizures this morning, longest of which lasted 10 minutes.  Grandmother reports patient was shaking in her bed, with urinary incontinence, eyes were open and [name] was drooling.  Grandmother reports that [name] had stopped Lamictal 2 days ago due to side effects: Depression, anxiety, increased heart rate, nausea, vomiting, neck pain and hallucinations.  [name] is also been confused and vomiting all week recently.  Notably [name] was seen in ED on 9/5 for witnessed seizures at home, and given oral dose of her Lamictal and IV fluids.  Patient was stable and discharged home with grandmother.  [name] is also treated for a recent STD, with doxycycline. Patient has had seizure history since the age of 81 months old, and had been well controlled on Depakote up until 2 months ago.  [name] was placed on Lamictal by her PCP 2 months ago, and stopped Depakote as patient wanted to get pregnant and had some breakthrough seizures. Having seizures at least every other day since on lamictal. When [name] was taking only Depakote a seizure once every 2-3 months. [name] reports significant side effects with Lamictal, and breakthrough seizures have worsened while only taking Lamictal. Typical seizures for [name] are generalized jerking movements but these new seizures since starting the lamictal involve her just staring without movements. Endorses incontinence and tongue biting.  Also complains that her head is started to hurt for the past month.  We are unsure of tobacco use alcohol use or  illicit drug use.  Patient laughs when asked about illicit drug use, this may be due to the fact that her grandmother was in the room with her. Patient reports seeing a green snake in the ED. Denies any head injury or trauma. Denies any rashes. Complaining of blurred vision intermittently for the past month. Wakes up at night and seems like [name] is sleep walking. Has not seen a neurologist in years. Has an appointment 10/18 with neurology.  Grandmother also reports that patient had seizures while they were on vacation in [place], and went to the ED there.  Family reports that the ED told her that the patient was having stress seizures, and [name] was sent home on her home medicines.  Stressors include her inability to work despite desire to, inability drive a car, and grandma reports patient seems depressed.Grandmother tells her [name] needs to take it easy. [name] has a boyfriend. Lives with mother but spends most of her time at her grandmother's house. Denies any recent illnesses and sick contacts.  Review Of Systems: Per HPI with the following additions:   Review of Systems  Constitutional:  Negative for fever and unexpected weight change.  HENT:  Negative for congestion and rhinorrhea.   Eyes:  Positive for visual disturbance. Negative for photophobia and redness.  Respiratory:  Negative for apnea, cough, choking, chest tightness and shortness of breath.   Cardiovascular:  Negative for chest pain, palpitations and leg swelling.  Gastrointestinal:  Positive for vomiting. Negative for abdominal pain, diarrhea and nausea.  Musculoskeletal:  Positive for myalgias and neck pain. Negative for neck stiffness.  Skin:  Negative for color change, pallor, rash and wound.  Neurological:  Positive for seizures, weakness and headaches. Negative for dizziness, syncope, facial asymmetry and speech difficulty.    Patient Active Problem List   Diagnosis Date Noted   Seizure (HCC) 01/28/2021   Anemia    Strep throat     Encounter for Nexplanon removal 11/04/2020   Family history of brain cancer 05/20/2015   Seizures (HCC) 10/31/2006    Past Medical History: Past Medical History:  Diagnosis Date   Seizure (HCC)    Seizures (HCC)    Well child check 04/26/2011    Past Surgical History: Past Surgical History:  Procedure Laterality Date   LEG SURGERY      Social History: Social History   Tobacco Use   Smoking status: Never   Smokeless tobacco: Never  Substance Use Topics   Alcohol use: No   Drug use: No   Additional social history: Patient is sexually active and does not consistently use barriers or other contraception.  Patient is unable to provide detailed social history.  We will need to confirm her tobacco, alcohol, drug use when patient is improved cognitively.   Family History: Family History  Problem Relation Age of Onset   Healthy Mother    Seizures Father     Allergies and Medications: Allergies  Allergen Reactions   Codeine    Latex    Tdap [Tetanus-Diphth-Acell Pertussis]    No current facility-administered medications on file prior to encounter.   Current Outpatient Medications on File Prior to Encounter  Medication Sig Dispense Refill   metroNIDAZOLE (FLAGYL) 500 MG tablet Take 1 tablet (500 mg total) by mouth 2 (two) times daily. 14 tablet 0   amoxicillin-clavulanate (AUGMENTIN) 875-125 MG tablet Take 1 tablet by mouth 2 (two) times daily. One po bid x 7 days 14 tablet 0   benzonatate (TESSALON) 200 MG capsule Take 1 capsule (200 mg total) by mouth every 8 (eight) hours. 16 capsule 0   Cetirizine HCl 10 MG CAPS Take 1 capsule (10 mg total) by mouth daily for 10 days. 10 capsule 0   lamoTRIgine (LAMICTAL) 100 MG tablet Take 1 tablet (100 mg total) by mouth 2 (two) times daily. 60 tablet 3   lamoTRIgine (LAMICTAL) 25 MG tablet Take 2 tablets (50 mg total) by mouth daily AND 1 tablet (25 mg total) at bedtime. 7 tablet 0   lamoTRIgine (LAMICTAL) 25 MG tablet Take 2  tablets (50 mg total) by mouth 2 (two) times daily for 7 days, THEN 3 tablets (75 mg total) 2 (two) times daily for 14 days, THEN 4 tablets (100 mg total) 2 (two) times daily for 21 days. Week 4: Take 50mg  in am, 50mg  in pm  Week 5: Take 75mg  in am, 50mg  in pm Week 6: Take 75mg  in am, 75mg  in pm Week 7: 100mg  am and 75mg  pm  Week 8 and 9: 100mg  am and pm. 280 tablet 0   lamoTRIgine (LAMICTAL) 25 MG tablet Take 2 tablets (50 mg total) by mouth 2 (two) times daily. 120 tablet 0   levETIRAcetam (KEPPRA) 500 MG tablet Take 1 tablet (500 mg total) by mouth 2 (two) times daily. 98 tablet 0   norgestimate-ethinyl estradiol (SPRINTEC 28) 0.25-35 MG-MCG tablet Take 1 tablet by mouth daily. 28 tablet 3   ondansetron (ZOFRAN ODT) 4 MG disintegrating tablet Take 1 tablet (4 mg total) by mouth every 8 (eight) hours as needed for nausea or vomiting. 10 tablet 0   ondansetron (ZOFRAN) 4 MG tablet Take 1 tablet (4  mg total) by mouth every 6 (six) hours. 12 tablet 0   [DISCONTINUED] loratadine (CLARITIN) 10 MG tablet Take 1 tablet (10 mg total) by mouth daily. 90 tablet 3    Objective: BP (!) 148/83   Pulse (!) 118   Temp 98.6 F (37 C) (Oral)   Resp (!) 31   LMP 04/12/2021 Comment: pt shielded  SpO2 100%  Exam: General: [name] is awake, not alert.  Oriented to person and location only.  Patient is distracted, needs repetitive prompting to follow commands.  Acute distress. Eyes: PERRL ENTM: Large scar over left orbit and forehead (this is from childhood).  Normocephalic and atraumatic.  Neck: Full range of motion. Cardiovascular: RRR, no MRG Respiratory: No increased work of breathing, CTAB Gastrointestinal: Bowel sounds present.  Abdomen is soft, nontender, not distended. MSK: Muscle strength is 5/5 in bilateral upper and lower extremities Derm: No lesions. Neuro: AOx1 (to place only) Cranial nerves II through XII grossly intact. Gross sensation intact. Follows most commands, responsive to verbal  stimuli. Normal finger to nose testing. 5/5 UE and LE strength bilaterally  Psych: no agitation noted, endorsing visual hallucinations   Parts of physical exam limited by patient's limited mental status.   Labs and Imaging: CBC BMET  Recent Labs  Lab 05/10/21 1005  WBC 4.3  HGB 12.1  HCT 38.6  PLT 333   Recent Labs  Lab 05/10/21 1005  NA 139  K 3.3*  CL 103  CO2 27  BUN 8  CREATININE 0.75  GLUCOSE 111*  CALCIUM 9.6       Sandre Kitty, Medical Student 05/10/2021, 2:10 PM Acting Intern, [facility] FPTS Intern pager: [phone], text pages welcome   I was personally present and performed or re-performed the history, physical exam and medical decision making activities of this service and have verified that the service and findings are accurately documented in the student's note.  Reece Leader, DO                  05/10/2021, 6:14 PM  PGY-2, [facility]

## 2021-05-10 NOTE — Assessment & Plan Note (Addendum)
Patient had a witnessed seizure Vs pseudoseizure in clinic today.  This was in the setting of being off her home Lamictal.When the pt initially came in she was alert sitting in her wheelchair, staring into space and unresponsive to words. Within 5-10 mins of the encounter after I stepped outside the room, she had generalized shaking with her eyes closed which lasted a few minutes.  Per grandma her last dose was 2 days ago. Vital signs wnl except HR 104. CBG 177. Her post ictal state was back to her staring. She is now alert and unresponsive to words. EMS called and pt was sent to the ER. I have notified the ER and FPTS team of likely admission. Will need labs, EEG and neurology consult in hospital.

## 2021-05-10 NOTE — Telephone Encounter (Signed)
Transition Care Management Follow-up Telephone Call Date of discharge and from where: 05/08/2021 How have you been since you were released from the hospital? Patient stated she feels ok.  Any questions or concerns? No  Items Reviewed: Did the pt receive and understand the discharge instructions provided? Yes  Medications obtained and verified?  N/A Other? No  Any new allergies since your discharge? No  Dietary orders reviewed? N/A Do you have support at home? Yes   Home Care and Equipment/Supplies: Were home health services ordered? not applicable If so, what is the name of the agency? N/A  Has the agency set up a time to come to the patient's home? not applicable Were any new equipment or medical supplies ordered?  No What is the name of the medical supply agency? N/A Were you able to get the supplies/equipment? not applicable Do you have any questions related to the use of the equipment or supplies? No  Functional Questionnaire: (I = Independent and D = Dependent) ADLs: I  Bathing/Dressing- I  Meal Prep- I  Eating- I  Maintaining continence- I  Transferring/Ambulation- I  Managing Meds- I  Follow up appointments reviewed:  PCP Hospital f/u appt confirmed? Yes  Seen Dr. Allena Katz today  Specialist [facility] f/u appt confirmed? Yes  Scheduled to see Dr. Terrace Arabia on 06/20/2021 @ 8:00 am. Are transportation arrangements needed? No  If their condition worsens, is the pt aware to call PCP or go to the Emergency Dept.? Yes Was the patient provided with contact information for the PCP's office or ED? Yes Was to pt encouraged to call back with questions or concerns? Yes

## 2021-05-10 NOTE — ED Notes (Signed)
Pt getting EEG at this time

## 2021-05-10 NOTE — ED Notes (Signed)
Pt urinated in bedpan. Urine was then spilled and this RN was unable to obtain sample

## 2021-05-11 ENCOUNTER — Inpatient Hospital Stay (HOSPITAL_COMMUNITY): Payer: Medicaid Other

## 2021-05-11 DIAGNOSIS — G319 Degenerative disease of nervous system, unspecified: Secondary | ICD-10-CM | POA: Diagnosis not present

## 2021-05-11 DIAGNOSIS — F23 Brief psychotic disorder: Secondary | ICD-10-CM | POA: Diagnosis not present

## 2021-05-11 DIAGNOSIS — R569 Unspecified convulsions: Secondary | ICD-10-CM | POA: Diagnosis not present

## 2021-05-11 LAB — URINALYSIS, ROUTINE W REFLEX MICROSCOPIC
Bacteria, UA: NONE SEEN
Bilirubin Urine: NEGATIVE
Glucose, UA: NEGATIVE mg/dL
Ketones, ur: NEGATIVE mg/dL
Leukocytes,Ua: NEGATIVE
Nitrite: NEGATIVE
Protein, ur: NEGATIVE mg/dL
Specific Gravity, Urine: 1.013 (ref 1.005–1.030)
pH: 7 (ref 5.0–8.0)

## 2021-05-11 LAB — CBC
HCT: 36.4 % (ref 36.0–46.0)
Hemoglobin: 11.8 g/dL — ABNORMAL LOW (ref 12.0–15.0)
MCH: 28.6 pg (ref 26.0–34.0)
MCHC: 32.4 g/dL (ref 30.0–36.0)
MCV: 88.3 fL (ref 80.0–100.0)
Platelets: 265 10*3/uL (ref 150–400)
RBC: 4.12 MIL/uL (ref 3.87–5.11)
RDW: 12.7 % (ref 11.5–15.5)
WBC: 4.7 10*3/uL (ref 4.0–10.5)
nRBC: 0 % (ref 0.0–0.2)

## 2021-05-11 LAB — COMPREHENSIVE METABOLIC PANEL
ALT: 13 U/L (ref 0–44)
AST: 17 U/L (ref 15–41)
Albumin: 3.9 g/dL (ref 3.5–5.0)
Alkaline Phosphatase: 41 U/L (ref 38–126)
Anion gap: 9 (ref 5–15)
BUN: 5 mg/dL — ABNORMAL LOW (ref 6–20)
CO2: 21 mmol/L — ABNORMAL LOW (ref 22–32)
Calcium: 9.5 mg/dL (ref 8.9–10.3)
Chloride: 106 mmol/L (ref 98–111)
Creatinine, Ser: 0.66 mg/dL (ref 0.44–1.00)
GFR, Estimated: >60 mL/min (ref >60)
Glucose, Bld: 93 mg/dL (ref 70–99)
Potassium: 4 mmol/L (ref 3.5–5.1)
Sodium: 136 mmol/L (ref 135–145)
Total Bilirubin: 1 mg/dL (ref 0.3–1.2)
Total Protein: 7.2 g/dL (ref 6.5–8.1)

## 2021-05-11 LAB — RAPID URINE DRUG SCREEN, HOSP PERFORMED
Amphetamines: NOT DETECTED
Barbiturates: NOT DETECTED
Benzodiazepines: NOT DETECTED
Cocaine: NOT DETECTED
Opiates: NOT DETECTED
Tetrahydrocannabinol: NOT DETECTED

## 2021-05-11 LAB — LAMOTRIGINE LEVEL: Lamotrigine Lvl: 10.9 ug/mL (ref 2.0–20.0)

## 2021-05-11 LAB — PREGNANCY, URINE: Preg Test, Ur: NEGATIVE

## 2021-05-11 MED ORDER — LORAZEPAM 2 MG/ML IJ SOLN
2.0000 mg | INTRAMUSCULAR | Status: DC | PRN
  Administered 2021-05-11: 2 mg via INTRAMUSCULAR

## 2021-05-11 MED ORDER — LORAZEPAM 2 MG/ML IJ SOLN
2.0000 mg | Freq: Once | INTRAMUSCULAR | Status: DC
  Filled 2021-05-11: qty 1

## 2021-05-11 MED ORDER — HALOPERIDOL LACTATE 5 MG/ML IJ SOLN: 2.0000 mg | INTRAMUSCULAR | Status: DC | PRN

## 2021-05-11 MED ORDER — GADOBUTROL 1 MMOL/ML IV SOLN
4.5000 mL | Freq: Once | INTRAVENOUS | Status: AC | PRN
  Administered 2021-05-11: 4.5 mL via INTRAVENOUS

## 2021-05-11 MED ORDER — HALOPERIDOL LACTATE 5 MG/ML IJ SOLN: 2.0000 mg | Freq: Once | INTRAMUSCULAR | Status: DC

## 2021-05-11 MED ORDER — OXCARBAZEPINE 150 MG PO TABS
150.0000 mg | ORAL_TABLET | Freq: Two times a day (BID) | ORAL | Status: DC
  Administered 2021-05-11 – 2021-05-13 (×4): 150 mg via ORAL
  Filled 2021-05-11 (×7): qty 1

## 2021-05-11 MED ORDER — QUETIAPINE FUMARATE 25 MG PO TABS
25.0000 mg | ORAL_TABLET | Freq: Two times a day (BID) | ORAL | Status: DC
  Administered 2021-05-11 – 2021-05-13 (×4): 25 mg via ORAL
  Filled 2021-05-11 (×6): qty 1

## 2021-05-11 MED ORDER — LORAZEPAM 2 MG/ML IJ SOLN: 1.0000 mg | Freq: Once | INTRAMUSCULAR | Status: DC

## 2021-05-11 MED ORDER — HALOPERIDOL LACTATE 5 MG/ML IJ SOLN: 2.0000 mg | Freq: Two times a day (BID) | INTRAMUSCULAR | Status: DC

## 2021-05-11 MED ORDER — HALOPERIDOL LACTATE 5 MG/ML IJ SOLN
2.0000 mg | Freq: Once | INTRAMUSCULAR | Status: AC
  Administered 2021-05-11: 2 mg via INTRAMUSCULAR
  Filled 2021-05-11: qty 1

## 2021-05-11 NOTE — ED Notes (Signed)
Grandmother at bedside. Patient continues to pull her electros off and is pulling at EEG leads and also pulled her IV out. Very difficult to re-direct.

## 2021-05-11 NOTE — ED Provider Notes (Signed)
[facility] EMERGENCY DEPARTMENT Provider Note   CSN: [id number] Arrival date & time: 04/14/21  2230     History Chief Complaint  Patient presents with   Animal Bite    [name] is a 20 y.o. female.   Animal Bite Contact animal:  Dog Location:  Shoulder/arm and leg Shoulder/arm injury location:  R forearm Leg injury location:  L lower leg Pain details:    Quality:  Aching and sharp   Severity:  Moderate   Timing:  Constant   Progression:  Waxing and waning Incident location:  Another residence and outside Provoked: unprovoked   Animal's rabies vaccination status:  Unknown Animal in possession: no       Past Medical History:  Diagnosis Date   Seizure (HCC)    Seizures (HCC)    Well child check 04/26/2011    Patient Active Problem List   Diagnosis Date Noted   Hypokalemia    Seizure-like activity (HCC) 01/28/2021   Anemia    Strep throat    Encounter for Nexplanon removal 11/04/2020   Family history of brain cancer 05/20/2015   Seizures (HCC) 10/31/2006    Past Surgical History:  Procedure Laterality Date   LEG SURGERY       OB History   No obstetric history on file.     Family History  Problem Relation Age of Onset   Healthy Mother    Seizures Father     Social History   Tobacco Use   Smoking status: Never   Smokeless tobacco: Never  Substance Use Topics   Alcohol use: No   Drug use: No    Home Medications Prior to Admission medications   Medication Sig Start Date End Date Taking? Authorizing Provider  amoxicillin-clavulanate (AUGMENTIN) 875-125 MG tablet Take 1 tablet by mouth 2 (two) times daily. One po bid x 7 days Patient not taking: Reported on 05/10/2021 04/15/21  Yes Izabela Ow, Barbara Cower, MD  metroNIDAZOLE (FLAGYL) 500 MG tablet Take 1 tablet (500 mg total) by mouth 2 (two) times daily. Patient not taking: Reported on 05/10/2021 02/01/21   Merrilee Jansky, MD  benzonatate (TESSALON) 200 MG capsule Take 1 capsule (200  mg total) by mouth every 8 (eight) hours. Patient not taking: Reported on 05/10/2021 01/28/21   Wieters, Fran Lowes C, [name]  Cetirizine HCl 10 MG CAPS Take 1 capsule (10 mg total) by mouth daily for 10 days. 01/28/21 02/07/21  Wieters, Hallie C, [name]  lamoTRIgine (LAMICTAL) 100 MG tablet Take 1 tablet (100 mg total) by mouth 2 (two) times daily. Patient not taking: Reported on 05/10/2021 04/26/21   Cora Collum, DO  lamoTRIgine (LAMICTAL) 25 MG tablet Take 2 tablets (50 mg total) by mouth daily AND 1 tablet (25 mg total) at bedtime. Patient not taking: Reported on 05/10/2021 02/13/21   Derrel Nip, MD  lamoTRIgine (LAMICTAL) 25 MG tablet Take 2 tablets (50 mg total) by mouth 2 (two) times daily for 7 days, THEN 3 tablets (75 mg total) 2 (two) times daily for 14 days, THEN 4 tablets (100 mg total) 2 (two) times daily for 21 days. Week 4: Take 50mg  in am, 50mg  in pm  Week 5: Take 75mg  in am, 50mg  in pm Week 6: Take 75mg  in am, 75mg  in pm Week 7: 100mg  am and 75mg  pm  Week 8 and 9: 100mg  am and pm. 03/08/21 04/19/21  , DO  lamoTRIgine (LAMICTAL) 25 MG tablet Take 2 tablets (50 mg total) by mouth  2 (two) times daily. Patient not taking: Reported on 05/10/2021 04/13/21 05/13/21  Cora Collum, DO  levETIRAcetam (KEPPRA) 500 MG tablet Take 1 tablet (500 mg total) by mouth 2 (two) times daily. Patient not taking: Reported on 05/10/2021 01/29/21   Derrel Nip, MD  norgestimate-ethinyl estradiol (SPRINTEC 28) 0.25-35 MG-MCG tablet Take 1 tablet by mouth daily. Patient not taking: Reported on 05/10/2021 03/08/21   Cora Collum, DO  ondansetron (ZOFRAN ODT) 4 MG disintegrating tablet Take 1 tablet (4 mg total) by mouth every 8 (eight) hours as needed for nausea or vomiting. 05/01/21   Elpidio Anis, [name]  ondansetron (ZOFRAN) 4 MG tablet Take 1 tablet (4 mg total) by mouth every 6 (six) hours. Patient not taking: Reported on 05/10/2021 03/03/21   Moshe Cipro, NP  loratadine (CLARITIN) 10 MG  tablet Take 1 tablet (10 mg total) by mouth daily. 10/15/18 01/28/21  Nestor Ramp, MD    Allergies    Codeine, Latex, and Tdap [tetanus-diphth-acell pertussis]  Review of Systems   Review of Systems  All other systems reviewed and are negative.  Physical Exam Updated Vital Signs BP 129/85 (BP Location: Right Arm)   Pulse 85   Temp 99 F (37.2 C) (Oral)   Resp 19   Ht 5\' 3"  (1.6 m)   Wt 48.5 kg   LMP 04/12/2021 Comment: pt shielded  SpO2 100%   BMI 18.95 kg/m   Physical Exam Vitals and nursing note reviewed.  Constitutional:      Appearance: She is well-developed.  HENT:     Head: Normocephalic and atraumatic.     Nose: Nose normal. No congestion or rhinorrhea.     Mouth/Throat:     Mouth: Mucous membranes are moist.     Pharynx: Oropharynx is clear.  Eyes:     Pupils: Pupils are equal, round, and reactive to light.  Cardiovascular:     Rate and Rhythm: Normal rate and regular rhythm.  Pulmonary:     Effort: No respiratory distress.     Breath sounds: No stridor.  Abdominal:     General: Abdomen is flat. There is no distension.  Musculoskeletal:        General: No swelling or tenderness. Normal range of motion.     Cervical back: Normal range of motion.  Skin:    General: Skin is warm and dry.     Comments: Multiple wounds to r forearm and left lower leg. All puncture. No large lacerations  Neurological:     General: No focal deficit present.     Mental Status: She is alert.    ED Results / Procedures / Treatments   Labs (all labs ordered are listed, but only abnormal results are displayed) Labs Reviewed - No data to display  EKG None  Radiology No results found.  Procedures Procedures   Medications Ordered in ED Medications  HYDROcodone-acetaminophen (NORCO/VICODIN) 5-325 MG per tablet 1 tablet (1 tablet Oral Given 04/14/21 2259)  amoxicillin-clavulanate (AUGMENTIN) 875-125 MG per tablet 1 tablet (1 tablet Oral Given 04/15/21 0245)   oxyCODONE-acetaminophen (PERCOCET/ROXICET) 5-325 MG per tablet 1 tablet (1 tablet Oral Given 04/15/21 0245)    ED Course  I have reviewed the triage vital signs and the nursing notes.  Pertinent labs & imaging results that were available during my care of the patient were reviewed by me and considered in my medical decision making (see chart for details).    MDM Rules/Calculators/A&P  Wound care per nursing. Patient understands need for rabies vaccine/Ig but refuses. Understands the ramifications. Will start abx.   Final Clinical Impression(s) / ED Diagnoses Final diagnoses:  Dog bite, initial encounter  Need for rabies vaccination    Rx / DC Orders ED Discharge Orders          Ordered    amoxicillin-clavulanate (AUGMENTIN) 875-125 MG tablet  2 times daily        04/15/21 0327             Rachelann Enloe, Barbara Cower, MD 05/11/21 [phone]

## 2021-05-11 NOTE — Progress Notes (Signed)
Provided emotional and spiritual support .Provided Bible per request of family.  Will follow as needed.  Venida Jarvis , [place], [facility], Pager [phone]

## 2021-05-11 NOTE — ED Notes (Signed)
MD made aware that pt growing more agitated, pulling off cords, wanting to leave. MD at bedside

## 2021-05-11 NOTE — ED Notes (Signed)
MD at bedside talking to patient. Pt not very interactive with MD. MD would like to hold off on meds as long as we can.

## 2021-05-11 NOTE — Procedures (Addendum)
Patient Name: [name]  MRN: [id number]  Epilepsy Attending: Charlsie Quest  Referring Physician/Provider: Dr Lindie Spruce  Duration: 05/10/2021 1748 to 05/11/2021 0530  Patient history: 20 year old female with history of epilepsy who presented with breakthrough seizures in the setting of medication noncompliance due to side effects.  EEG to evaluate for seizures.  Level of alertness: Awake  AEDs during EEG study: LEV  Technical aspects: This EEG study was done with scalp electrodes positioned according to the 10-20 International system of electrode placement. Electrical activity was acquired at a sampling rate of 500Hz  and reviewed with a high frequency filter of 70Hz  and a low frequency filter of 1Hz . EEG data were recorded continuously and digitally stored.   Description: The posterior dominant rhythm consists of 8 Hz activity of moderate voltage (25-35 uV) seen predominantly in posterior head regions, symmetric and reactive to eye opening and eye closing. Hyperventilation and photic stimulation were not performed.     Of note, patient pulled out her electrodes around 5 AM on 05/11/2021 after which this study was not interpretable.  IMPRESSION: This study is within normal limits. No seizures or epileptiform discharges were seen throughout the recording.  Corie Allis 

## 2021-05-11 NOTE — Progress Notes (Signed)
FPTS Brief Progress Note  S: Patient was sleeping deeply with grandmother at bedside.  Patient's grandmother says that the sedating medication has helped her calm down.   O: BP 98/69 (BP Location: Left Arm)   Pulse 77   Temp 98.6 F (37 C) (Axillary)   Resp 19   LMP 04/12/2021 Comment: pt shielded  SpO2 100%   Constitutional: Patient is sleeping in bed in no acute distress Cardio: Regular rate and rhythm without murmurs Respiratory: CTAB   A/P: [name] is a 20 year old female who initially presented for seizure-like activity.  She became more agitated over the course of the day and has had waxing and waning moments orientation.  Patient has been given Haldol 2 mg as well as Ativan 2 mg over the course of the day.  Grandmother will be present with her overnight to help reorient her.  We can also consider Ativan but will try to stay away from Haldol overnight if needed.  Unable to perform neuro exam given the patient was sleeping.  She is also in soft restraints on wrists only.   Alfredo Martinez, MD 05/11/2021, 8:45 PM PGY-1, [facility] Family Medicine Night Resident  Please page [phone] with questions.

## 2021-05-11 NOTE — Evaluation (Signed)
Clinical/Bedside Swallow Evaluation Patient Details  Name: [name] MRN: [id number] Date of Birth: [birth year]  Today's Date: 05/11/2021 Time: SLP Start Time (ACUTE ONLY): 1040 SLP Stop Time (ACUTE ONLY): 1100 SLP Time Calculation (min) (ACUTE ONLY): 20 min  Past Medical History:  Past Medical History:  Diagnosis Date   Seizure (HCC)    Seizures (HCC)    Well child check 04/26/2011   Past Surgical History:  Past Surgical History:  Procedure Laterality Date   LEG SURGERY     HPI:  [name] is a 20 year old female who presents with seizure-like activity.  She also is not in a normal state of consciousness and has a been for about a week.  She previously has been somewhat redirectable. However, per nursing and grandmother, tonight she has been more difficult to redirect   Assessment / Plan / Recommendation Clinical Impression  Pt demonstrates confusion and decreased attention to POs. Grandmother and SLP reduced distractions and provided verbal cues for pt to attend to PO. There were times that pt refused and orally held boluses, but eventually pt was able to self feed a can of Sprite, graham crackers and puree. Pt will need full supervision with PO for safety, but can initiate a regular diet and thin liquids. No SLp f/u needed. SLP Visit Diagnosis: Dysphagia, unspecified (R13.10)    Aspiration Risk  Mild aspiration risk    Diet Recommendation Regular;Thin liquid   Liquid Administration via: Cup;Straw Medication Administration: Whole meds with liquid Supervision: Full supervision/cueing for compensatory strategies Compensations: Minimize environmental distractions Postural Changes: Seated upright at 90 degrees    Other  Recommendations     Follow up Recommendations        Frequency and Duration            Prognosis        Swallow Study   General HPI: [name] is a 20 year old female who presents with seizure-like activity.  She also is not in a normal state  of consciousness and has a been for about a week.  She previously has been somewhat redirectable. However, per nursing and grandmother, tonight she has been more difficult to redirect Type of Study: Bedside Swallow Evaluation Diet Prior to this Study: NPO Temperature Spikes Noted: No Respiratory Status: Room air History of Recent Intubation: No Behavior/Cognition: Alert;Distractible;Requires cueing;Confused;Agitated Oral Care Completed by SLP: No Oral Cavity - Dentition: Adequate natural dentition Self-Feeding Abilities: Able to feed self;Needs assist;Needs set up Patient Positioning: Upright in bed Baseline Vocal Quality: Normal    Oral/Motor/Sensory Function Overall Oral Motor/Sensory Function: Within functional limits   Ice Chips     Thin Liquid Thin Liquid: Impaired Presentation: Cup;Straw;Self Fed Oral Phase Functional Implications: Oral holding    Nectar Thick Nectar Thick Liquid: Not tested   Honey Thick Honey Thick Liquid: Not tested   Puree Puree: Impaired Oral Phase Impairments: Poor awareness of bolus   Solid     Solid: Within functional limits      [name], [name] 05/11/2021,1:40 PM

## 2021-05-11 NOTE — ED Notes (Signed)
Attempt report x1  

## 2021-05-11 NOTE — Consult Note (Signed)
  Patient is seen and assessed by the psychiatric nurse practitioner., while in the ED.  Psych consult was placed for hallucinations.  Patient appears to be experiencing psychosis in addition to agitation and aggression related to her postictal state.  Her grandmother was at the bedside, in which provider attempted to provide some education regarding prolonged seizures versus delirium.  Grandmother appears to be more concerned about cessation of her seizures more so than her post ictal symptoms.   Patient on evaluation is alert and oriented to self, and place.  She is able to correctly identify her grandmother [name] was also at the bedside.  Consent is obtained to discuss current findings and recommendations with grandmother.  Patient appears to be delusional, however she is easily redirectable by her grandmother.  She does not appear to be exhibiting any disruptive behaviors, agitation, and or aggression during the evaluation.  She does not appear to be responding to internal stimuli, external stimuli, and or psychoses.   When present in the room patient did not appear to be hallucinating, however grandmother reports patient was seeing" a snake with the red-head."  She states she has been with the patient throughout the night, and she does appear to be doing somewhat better.  She is hoping that patient gets a bed very soon.  Grandmother also has voiced concerns regarding medication and side effects of Lamictal.  Grandmother reports Lamictal was started by Dr. Shanon Rosser,, who she feels should not have started Lamictal on her granddaughter.  She states her granddaughter was having abnormal side effects depression, difficulty walking, slurred speech, visual disturbances, and hallucinations.  Grandmother is informed that in the mental is an antiepileptic medication used to treat seizures, in addition to a mood stabilizer.  Again emphasis was placed on hallucinations being a result of prolonged seizure like  activity, versus a side effect of the medication.  Grandmother does appear to be open to different treatment options, however refuses Lamictal at this time.  Recommendations have been made and placed in her chart.  Post-Ictal Psychosis: -Patient may benefit from one-to-one safety sitter, as she continues to exhibit postictal psychosis, the duration of postictal psychosis varies from hours to days. (Patient responds to grandmother redirection and therapeutic touch, consider allowing grandmother to stay with patient once admitted to medical floor.) -Will start Seroquel 25 mg p.o. twice daily, for management of postictal psychosis and/or delirium. -Will continue Ativan p.o. or IM, for post ictal agitation and or aggression.  Patient has ripped out her IV, prior to this evaluation and IV access in gained consider switching Ativan to IV. -Continue with current recommendations per neurology for MRI, patient was unable to complete EEG.  Will likely need increased sedation to complete.  Psychiatry to to sign off at this time. Patient does not meet inpatient criteria and does not appear to be harm or danger to herself.

## 2021-05-11 NOTE — Progress Notes (Addendum)
Family Medicine Teaching Service Daily Progress Note Intern Pager: [phone]  Patient name: [name] Medical record number: [id number] Date of birth: 2000/12/13 Age: 20 y.o. Gender: female  Primary Care Provider: Cora Collum, DO Consultants:  Neurology Code Status: Full  Pt Overview and Major Events to Date:  9/7: Admitted for seizure work up  Assessment and Plan: [name] is a 20 year old woman who presented to the ED after having a witnessed seizure v pseudoseizure in PCP office.  She had multiple seizures prior to presenting to her PCP.  Pertinent past medical history includes seizure disorder since childhood.  Seizure-like activity  Psychosis Patient presented to the ED after having a witnessed seizure in her PCP clinic.  Has a longstanding history of seizure disorder, previously controlled with Depakote.  However she switched to Lamictal for the past month as she desired pregnancy.  Grandmother reports noncompliance with the medication due to significant side effects, last dose Monday, 9/5.  Ethanol was not detected in her blood.  Urine drug screen and pregnancy screen came back negative.  Patient is not hypoglycemic.  TSH normal.  No new electrolyte abnormalities.  She has remained afebrile.  She had overnight EEG, which was read this morning and did not show concerning abnormalities.  Neurology will order MRI.  They also recommend psychiatry consult given patient having visual and auditory hallucinations, responding to internal stimuli, and concommittant vital sign changes with tachycardia, tachypnea, and hypertension. Patient's cognitive state has not improved, and she has become agitated.  Patient appears to be in psychosis. -Appreciate continued neurology recommendations -Discontinue Keppra -Oxcarbazepine for seizure and mood coverage -MRI of head without contrast -Seizure precautions -EKG -Psychiatry consult, appreciate recommendations -Neurology consult, appreciate  recommendations -MR brain -SLP evaluation  Sinus Tachycardia Patient has intermittently had elevated HR to 130s-170s. EKG this morning reveals sinus tachycardia. Continue to monitor. This is suspected to be due to agitation and psychosis. -Cardiac monitoring  Hypokalemia Potassium was 3.3 upon admission.  She was not able to take p.o. potassium supplementation due to NPO status for her seizures.  Neuro has cleared her for oral medication. -Repeat CMP  FEN/GI: ADAT PPx: Lovenox Dispo: Pending    Subjective:  Grandmother is with patient at bedside and provides most of the history.  Per grandmother and nurse night team the patient has been trying to get out of bed and is difficult to reorient.  This is a change in her cognitive state since arriving to ED. Patient has not eaten or been able to drink due to current state.  She did not sleep last night.  She has not voided nor had a bowel movement.  Objective: Temp:  [98.5 F (36.9 C)-98.6 F (37 C)] 98.5 F (36.9 C) (09/08 0625) Pulse Rate:  [84-137] 84 (09/08 0625) Resp:  [14-31] 17 (09/08 0625) BP: (118-157)/(79-100) 124/80 (09/08 0625) SpO2:  [88 %-100 %] 100 % (09/08 0625) Weight:  [47.8 kg] 47.8 kg (09/07 0851) Physical Exam: General: Young AAW, awake, alert, but not oriented, mild distress, WNWD Cardiovascular: Regular rhythm, fast rate, no MRG Respiratory: Normal work of breathing, CTAB Abdomen: Bowel sounds present, non-tender, non-distended. Extremities: Pulses present bilaterally, no edema Neurologic: Patient is inattentive, and agitated, and oriented only to self and year. Cranial nerve exam not fully completed due to patient agitation and inattention  She is unable to follow commands. Patient is responding to internal stimuli, and reports visual and auditory hallucinations of people who are not there and animals (snakes on walls, crying  babies, her boyfriend).  Laboratory: Recent Labs  Lab 05/07/21 2314 05/10/21 1005   WBC 5.7 4.3  HGB 13.0 12.1  HCT 41.2 38.6  PLT 327 333   Recent Labs  Lab 05/07/21 2314 05/10/21 1005  NA 139 139  K 3.5 3.3*  CL 106 103  CO2 22 27  BUN 8 8  CREATININE 0.81 0.75  CALCIUM 10.0 9.6  PROT  --  7.2  BILITOT  --  0.3  ALKPHOS  --  48  ALT  --  13  AST  --  20  GLUCOSE 114* 111*     Imaging/Diagnostic Tests: EEG Impression: No concerning seizure activity.  EKG: sinus tachycardia  Pending MRI  Sandre Kitty, Medical Student 05/11/2021, 6:50 AM  Acting Intern, [facility] FPTS Intern pager: [phone], text pages welcome   FPTS Upper-Level Resident Addendum   I have independently interviewed and examined the patient. I have discussed the above with the original author and agree with their documentation. I have added additional edits as necessary. Please see also any attending notes.   Shirlean Mylar, M.D. PGY-2, [facility] 05/11/2021 6:04 PM  FPTS Service pager: [phone] (text pages welcome through [facility])

## 2021-05-11 NOTE — Progress Notes (Signed)
Patient unable to stay still long enough to receive PIV. Discussed with Dr. Melynda Ripple, neurology. Will use haldol IM to be able to place PIV so that we can obtain crucial neuroimaging, MR brain. Can use Ativan IV so patient can tolerate MR procedure. Cleared for regular diet with thin liquids by SLP, but needs to be observed.  Shirlean Mylar, MD [facility] Family Medicine Residency, PGY-3

## 2021-05-11 NOTE — ED Notes (Signed)
Pt cleaned up and purewick placed. Pt placed back on monitor and IV wrapped. Pt appears frustrated and does not want to be here. Grandmother at bedside to help redirect patient

## 2021-05-11 NOTE — ED Notes (Signed)
RN unable to assess pt prior to giving report to receiving Vita Barley, Charity fundraiser. Report given per previous RN's notes and chart.   PCP currently at bedside, acme out of room advised this RN pt was out of bed and sitting in the floor. PCP is assisting charge nurse, Chestine Spore with assisting pt from floor and back toy bed

## 2021-05-11 NOTE — Progress Notes (Signed)
Pt ripped 1/2 leads off this morning. Tech removed rest of leads. Pt needs to go to MRI. Pt is very aggressive and combative during un hook. No skin breakdown was seen

## 2021-05-11 NOTE — Progress Notes (Signed)
Initially called pt's grandmother but she was approaching the department. Went to the room and discussed the restraints with the grandmother and we also explained to the patient. Grandmother understood the reason and the safety measures that are in place (sitter if available and seizure pads). [facility] swallow eval done because the speech note was not available at the time. Pt passed swallow test and medications were attempted by RN helping. Pt took the medication when her grandmother assisted. Family medicine was aware.

## 2021-05-11 NOTE — ED Notes (Signed)
Attending paged to request change of route for IV ativan to IM due to patient pulling out IV.

## 2021-05-11 NOTE — Progress Notes (Addendum)
Interim progress note Psychiatry saw this patient, and placed patient on p.o. Seroquel.  They are concerned for psychosis.  Patient's nurse contacted [place] saying that patient is spitting out p.o. Seroquel.  She is currently in four-point soft restraints, as she has been getting out of bed, and taking out her IVs. We called psychiatry and spoke with Caryn Bee FNP, who had seen this patient in the morning. They recommended 2 mg Haldol IV twice daily, until patient is able to take oral medications. We talked to the nurse and asked them to place IV, however we know this has been difficult due to her agitated state.  -2 mg Haldol IV twice daily -Continue soft restraints -Safety sitter -Reassess patient after IV is placed and Haldol given  Sharmon Leyden  Reviewed by me, upper level for FPTS. Agree with medical student's note. Shirlean Mylar, MD [facility] Family Medicine Residency, PGY-3

## 2021-05-11 NOTE — Progress Notes (Signed)
FPTS Brief Progress Note  S: Patient with nursing that the patient is becoming less redirectable, pulling off her cords, trying to get out of bed, and becoming more agitated.  At bedside, the patient started in a state of seizure-like activity with drooling that lasted for a few seconds.  Then the patient was alert but did not want to talk to me.    O: BP 119/79   Pulse (!) 106   Temp 98.6 F (37 C) (Oral)   Resp 18   LMP 04/12/2021 Comment: pt shielded  SpO2 100%   Constitutional: Presence of seizure like activity that resolved while in the room  Respiratory: Normal WOB Neuro: Alert but not oriented to self, place, or time.  Would not participate in any more neuro exam.   A/P: [name] is a 20 year old female who presents with seizure-like activity.  She also is not in a normal state of consciousness and has a been for about a week.  She previously has been somewhat redirectable. However, per nursing and grandmother, tonight she has been more difficult to redirect.  Because she is receiving an EEG, we have been trying to avoid sedating medications.  Spoke to nursing and the grandmother, and they agreed to try to continue to redirect the patient.  If she becomes more difficult to get into bed, will not stop pulling off her cords, or becomes more agitated, we will try something else.   Alfredo Martinez, MD 05/11/2021, 2:59 AM PGY-1, [place] [facility] Night Resident  Please page [phone] with questions.

## 2021-05-11 NOTE — Progress Notes (Addendum)
Subjective: Patient has been very agitated overnight, pulled her EEG electrodes this morning.  Per staff patient has been hallucinating.  When I walked in the room, patient was agitated and had pressured speech, looked agitated but was briefly redirectable.  ROS: Unable to obtain due to poor mental status  Examination  Vital signs in last 24 hours: Temp:  [98.5 F (36.9 C)-99.3 F (37.4 C)] 99.3 F (37.4 C) (09/08 1150) Pulse Rate:  [84-153] 113 (09/08 1150) Resp:  [13-31] 14 (09/08 1150) BP: (118-154)/(79-100) 128/94 (09/08 1150) SpO2:  [88 %-100 %] 100 % (09/08 1150)  General: lying in bed, NAD Psych: Pressured speech, redirectable only for brief periods of time CVS: pulse-normal rate and rhythm RS: breathing comfortably, CTAB Extremities: normal, warm  Neuro: Awake, alert, knows her name, does not answer further orientation questions, cranial nerves appear grossly intact, spontaneously moving all 4 extremities in bed  Basic Metabolic Panel: Recent Labs  Lab 05/07/21 2314 05/10/21 1005 05/11/21 0700  NA 139 139 136  K 3.5 3.3* 4.0  CL 106 103 106  CO2 22 27 21*  GLUCOSE 114* 111* 93  BUN 8 8 5*  CREATININE 0.81 0.75 0.66  CALCIUM 10.0 9.6 9.5    CBC: Recent Labs  Lab 05/07/21 2314 05/10/21 1005 05/11/21 0700  WBC 5.7 4.3 4.7  NEUTROABS 3.9 2.8  --   HGB 13.0 12.1 11.8*  HCT 41.2 38.6 36.4  MCV 91.8 91.5 88.3  PLT 327 333 265     Coagulation Studies: No results for input(s): LABPROT, INR in the last 72 hours.  Imaging MRI brain ordered and pending  ASSESSMENT AND PLAN: : 20 year old female with history of epilepsy who presented with breakthrough seizures in the setting of medication noncompliance due to side effects.   Epilepsy with breakthrough seizures Acute encephalopathy -Differential for staring spells includes epileptic seizures versus nonepileptic versus medication side effect -Encephalopathy could be neurologic (due to medication side effects,  postictal psychosis, NMDA encephalitis) versus primary psychotic disorder (patient's grandmother reports visual hallucinations   Recommendations: -Patient pulled out her electrodes this morning.  However, no ictal or interictal abnormality were noted overnight. -We will first obtain MRI brain with and without contrast for possible NMDA encephalitis -We will also likely related to EEG for characterization of spells -Patient received 1000 mg of Keppra yesterday.  Keppra can occasionally worsen irritability, psychosis.  However, patient was having visual hallucinations per family even prior to admission.  For now, I would recommend switching patient to a different AED like oxcarbazepine 150 mg twice daily which also has additional mood stabilizing properties -If patient not able to tolerate p.o. medication, will likely administer IV Vimpat 50 mg twice daily -Depending on MRI findings, can consider lumbar puncture. -Would also recommend psychiatry consult to look for primary psychiatric disorders due to visual hallucinations -Continue seizure precautions -As needed IV Versed 2 mg clinical seizure-like activity   I have spent a total of  36 minutes with the patient reviewing hospital notes,  test results, labs and examining the patient as well as establishing an assessment and plan that was discussed personally with the patient's primary team. 50% of time was spent in direct patient care.    Lindie Spruce Epilepsy [facility] For questions after 5pm please refer to AMION to reach the Neurologist on call

## 2021-05-11 NOTE — ED Notes (Signed)
Admitting called to make RN aware they were ordering restraints. Advised admitting pt had been transported upstairs. They will update 3W RN. Deanna Artis RN made aware

## 2021-05-12 DIAGNOSIS — R569 Unspecified convulsions: Secondary | ICD-10-CM | POA: Diagnosis not present

## 2021-05-12 DIAGNOSIS — F23 Brief psychotic disorder: Secondary | ICD-10-CM

## 2021-05-12 LAB — GLUCOSE, CAPILLARY
Glucose-Capillary: 65 mg/dL — ABNORMAL LOW (ref 70–99)
Glucose-Capillary: 97 mg/dL (ref 70–99)

## 2021-05-12 LAB — CBC
HCT: 40.6 % (ref 36.0–46.0)
Hemoglobin: 12.9 g/dL (ref 12.0–15.0)
MCH: 28.9 pg (ref 26.0–34.0)
MCHC: 31.8 g/dL (ref 30.0–36.0)
MCV: 90.8 fL (ref 80.0–100.0)
Platelets: 277 10*3/uL (ref 150–400)
RBC: 4.47 MIL/uL (ref 3.87–5.11)
RDW: 12.9 % (ref 11.5–15.5)
WBC: 3.9 10*3/uL — ABNORMAL LOW (ref 4.0–10.5)
nRBC: 0 % (ref 0.0–0.2)

## 2021-05-12 LAB — BASIC METABOLIC PANEL
Anion gap: 8 (ref 5–15)
BUN: 9 mg/dL (ref 6–20)
CO2: 22 mmol/L (ref 22–32)
Calcium: 9.6 mg/dL (ref 8.9–10.3)
Chloride: 108 mmol/L (ref 98–111)
Creatinine, Ser: 0.71 mg/dL (ref 0.44–1.00)
GFR, Estimated: >60 mL/min (ref >60)
Glucose, Bld: 68 mg/dL — ABNORMAL LOW (ref 70–99)
Potassium: 4.7 mmol/L (ref 3.5–5.1)
Sodium: 138 mmol/L (ref 135–145)

## 2021-05-12 LAB — RESP PANEL BY RT-PCR (FLU A&B, COVID) ARPGX2
Influenza A by PCR: NEGATIVE
Influenza B by PCR: NEGATIVE
SARS Coronavirus 2 by RT PCR: NEGATIVE

## 2021-05-12 NOTE — Progress Notes (Addendum)
LTM EEG re-hook. Up and running - no initial skin breakdown - push button tested - neuro notified. [facility] monitoring.

## 2021-05-12 NOTE — Progress Notes (Addendum)
Family Medicine Teaching Service Daily Progress Note Intern Pager: [phone]  Patient name: [name] Medical record number: [id number] Date of birth: 06-28-2001 Age: 20 y.o. Gender: female  Primary Care Provider: Cora Collum, DO Consultants: Neurology, psychiatry Code Status: Full  Pt Overview and Major Events to Date:  9/7: Admitted for seizure work-up  Assessment and Plan:  Seizure-like activity  psychosis Yesterday patient was agitated, and needed Haldol injection to secure IV access for MRI.  She received Haldol and IV was placed.  MRI was completed and was noncontributory except for finding of subtle asymmetric atrophy of the left temporal lobe and hippocampus.  Neurology recommended correlation with EEG.  Patient is able to tolerate p.o. medications, and we will continue her Seroquel which was recommended by psychiatry for her psychosis.  She is awake and alert and oriented x3 this morning, which is a significant improvement from yesterday.  She still has a flat affect, and is inattentive at times.  We will continue to monitor.   -Appreciate continued neurology recommendation -Psychiatry has signed off, reconsult as appropriate  -Continue Seroquel twice daily -Continue oxcarbazepine -Monitor for clinical improvement -Pending EEG   Sinus tachycardia, resolved Patient has intermittently had elevated HR to 130s to 170s.  EKG revealed sinus tachycardia yesterday.  Her heart rate remained under 90 overnight.  -Continue to monitor  Hypokalemia, resolved Patient had potassium of 3.3 upon admission.  Her potassium is 4.7 today.  She has not received supplementation.  FEN/GI: Regular diet PPx: Lovenox Dispo:Home today or tomorrow, pending EEG results and ability to ambulate.  Subjective:  Patient is with grandmother at bedside.  Patient is able to provide answers and grandmother is able to corroborate.  She is doing much better from time of admission.  She is oriented to  person place and time, however she was confused as to why she was admitted.  We discussed that she was having seizure-like activity and psychosis.  Patient reports that she is not in any pain, however she is uncomfortable because she needs to pee and is hooked up to the EEG.  She does not have any questions for [place] at this time, and would like to go home.  We discussed the need for finishing the EEG, and consulting neurology before discharge.   Objective: Temp:  [98 F (36.7 C)-99.3 F (37.4 C)] 98.1 F (36.7 C) (09/09 0437) Pulse Rate:  [76-153] 76 (09/09 0437) Resp:  [13-19] 18 (09/09 0437) BP: (98-154)/(63-94) 107/81 (09/09 0437) SpO2:  [100 %] 100 % (09/09 0437) Physical Exam: General: She is awake alert and oriented x3.  Not in acute distress. Cardiovascular: RRR, no MRG Respiratory: Normal work of breathing.  CTAB Abdomen: Soft, not tender, not distended. Extremities: No edema. Neurologic: Cranial nerves II through XII intact.  5/5 muscle strength in upper and lower extremities bilaterally.  Sensation is grossly intact bilaterally. Psychiatric: Flat affect.  She is inattentive, but can be refocused with gentle prompting. No auditory or visual hallucinations. Denies any thoughts of hurting herself or others.  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 This needs to be correlated with EEG.   Otherwise no acute intracranial abnormality identified.  Pending EEG  Sandre Kitty, Medical Student 05/12/2021, 6:45 AM OMS IV, [facility] FPTS Intern pager: [phone], text pages welcome   Overall improved, not endorsing hallucinations and more active. Eating yogurt this morning. CBG improved, expect to continue to improve as she eats more. Awaiting neurology recommendations.  I was personally present and performed or re-performed the history, physical exam and medical decision making activities of this service and have verified that the service and findings are accurately documented in the student's note.  Reece Leader, DO                  05/12/2021, 8:38 AM  PGY-2, [facility] Family Medicine Residency

## 2021-05-12 NOTE — Procedures (Addendum)
Patient Name: [name]  MRN: [id number]  Epilepsy Attending: Charlsie Quest  Referring Physician/Provider: Dr Lindie Spruce  Duration: 05/11/2021 2210 to 05/12/2021 2210   Patient history: 20 year old female with history of epilepsy who presented with breakthrough seizures in the setting of medication noncompliance due to side effects.  EEG to evaluate for seizures.   Level of alertness: Awake, asleep   AEDs during EEG study: LEV   Technical aspects: This EEG study was done with scalp electrodes positioned according to the 10-20 International system of electrode placement. Electrical activity was acquired at a sampling rate of 500Hz  and reviewed with a high frequency filter of 70Hz  and a low frequency filter of 1Hz . EEG data were recorded continuously and digitally stored.    Description: The posterior dominant rhythm consists of 8-9 Hz activity of moderate voltage (25-35 uV) seen predominantly in posterior head regions, symmetric and reactive to eye opening and eye closing.  Sleep was characterized by vertex waves, sleep spindles (12 to 14 Hz), maximal frontocentral region.  Hyperventilation and photic stimulation were not performed.      IMPRESSION: This study is within normal limits. No seizures or epileptiform discharges were seen throughout the recording.   [name] 

## 2021-05-12 NOTE — Progress Notes (Signed)
Subjective: No seizures overnight.  Grandmother bedside.  Both patient and grandmother states she is much better today.  Requesting to go home.  ROS: negative except above  Examination  Vital signs in last 24 hours: Temp:  [98 F (36.7 C)-99.3 F (37.4 C)] 98.1 F (36.7 C) (09/09 0719) Pulse Rate:  [76-113] 107 (09/09 0719) Resp:  [14-19] 14 (09/09 0719) BP: (98-131)/(63-94) 131/86 (09/09 0719) SpO2:  [100 %] 100 % (09/09 0437)  General: lying in bed, NAD CVS: pulse-normal rate and rhythm RS: breathing comfortably, CTAB Extremities: normal, warm  Neuro: MS: Alert, oriented, follows commands CN: pupils equal and reactive,  EOMI, face symmetric, tongue midline, normal sensation over face, Motor: 5/5 strength in all 4 extremities Reflexes: 2+ bilaterally over patella, biceps, plantars: flexor Coordination: normal Gait: not tested  Basic Metabolic Panel: Recent Labs  Lab 05/07/21 2314 05/10/21 1005 05/11/21 0700 05/12/21 0241  NA 139 139 136 138  K 3.5 3.3* 4.0 4.7  CL 106 103 106 108  CO2 22 27 21* 22  GLUCOSE 114* 111* 93 68*  BUN 8 8 5* 9  CREATININE 0.81 0.75 0.66 0.71  CALCIUM 10.0 9.6 9.5 9.6    CBC: Recent Labs  Lab 05/07/21 2314 05/10/21 1005 05/11/21 0700 05/12/21 0241  WBC 5.7 4.3 4.7 3.9*  NEUTROABS 3.9 2.8  --   --   HGB 13.0 12.1 11.8* 12.9  HCT 41.2 38.6 36.4 40.6  MCV 91.8 91.5 88.3 90.8  PLT 327 333 265 277     Coagulation Studies: No results for input(s): LABPROT, INR in the last 72 hours.  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 No convincing corresponding signal abnormality as is typically seen with mesial temporal sclerosis. Correlation with EEG recommended. Otherwise normal brain MRI. No other acute intracranial abnormality identified.  ASSESSMENT AND PLAN: 20 year old female with history of epilepsy who  presented with breakthrough seizures in the setting of medication noncompliance due to side effects.   Epilepsy with breakthrough seizures Acute encephalopathy, toxic-metabolic ( resolved) -Differential for staring spells includes epileptic seizures versus nonepileptic versus medication side effect -Encephalopathy improved, less likely medication related   Recommendations: -We will continue video monitoring for the 24 hours for characterization of spells.  If normal by tomorrow, will discontinue -Continue oxcarbazepine 150 mg twice daily -Follow-up with Dr. An at [facility] scheduled for 06/20/2021 at 0800.  Discussed with grandmother the importance of keeping this follow-up.  I also gave patient and grandmother my information in case she has medication side effects after discharge from the hospital but before her neurology appointment -Of note, patient had well-controlled seizures on Depakote but expressed interest in having kids.  I discussed teratogenic effects of Depakote and suggested trying a different AED.  Therefore currently patient is on oxcarbazepine.  If patient continues to have side effects, can consider another AED like Vimpat.  However, if patient is not able to tolerate any of the AEDs or continues to have seizures, patient and family would like to be back on Depakote understanding the potential teratogenic side effects. -Patient grandmother also stated patient was on disability until last year and was hoping to start a job but likely needs to continue on disability.  I agree that this patient will benefit being on disability at least while we are adjusting her medications and she is unable to drive. -Continue seizure precautions -As needed IV Versed 2  mg clinical seizure-like activity -If patient remains seizure-free and at baseline tomorrow, patient will be ready for discharge from neurology standpoint.  Seizure precautions: Per [facility] statutes, patients with seizures are  not allowed to drive until they have been seizure-free for six months and cleared by a physician    Use caution when using heavy equipment or power tools. Avoid working on ladders or at heights. Take showers instead of baths. Ensure the water temperature is not too high on the home water heater. Do not go swimming alone. Do not lock yourself in a room alone (i.e. bathroom). When caring for infants or small children, sit down when holding, feeding, or changing them to minimize risk of injury to the child in the event you have a seizure. Maintain good sleep hygiene. Avoid alcohol.    If patient has another seizure, call 911 and bring them back to the ED if: A.  The seizure lasts longer than 5 minutes.      B.  The patient doesn't wake shortly after the seizure or has new problems such as difficulty seeing, speaking or moving following the seizure C.  The patient was injured during the seizure D.  The patient has a temperature over 102 F (39C) E.  The patient vomited during the seizure and now is having trouble breathing    During the Seizure   - First, ensure adequate ventilation and place patients on the floor on their left side  Loosen clothing around the neck and ensure the airway is patent. If the patient is clenching the teeth, do not force the mouth open with any object as this can cause severe damage - Remove all items from the surrounding that can be hazardous. The patient may be oblivious to what's happening and may not even know what he or she is doing. If the patient is confused and wandering, either gently guide him/her away and block access to outside areas - Reassure the individual and be comforting - Call 911. In most cases, the seizure ends before EMS arrives. However, there are cases when seizures may last over 3 to 5 minutes. Or the individual may have developed breathing difficulties or severe injuries. If a pregnant patient or a person with diabetes develops a seizure, it is  prudent to call an ambulance. - Finally, if the patient does not regain full consciousness, then call EMS. Most patients will remain confused for about 45 to 90 minutes after a seizure, so you must use judgment in calling for help. - Avoid restraints but make sure the patient is in a bed with padded side rails - Place the individual in a lateral position with the neck slightly flexed; this will help the saliva drain from the mouth and prevent the tongue from falling backward - Remove all nearby furniture and other hazards from the area - Provide verbal assurance as the individual is regaining consciousness - Provide the patient with privacy if possible - Call for help and start treatment as ordered by the caregiver    After the Seizure (Postictal Stage)   After a seizure, most patients experience confusion, fatigue, muscle pain and/or a headache. Thus, one should permit the individual to sleep. For the next few days, reassurance is essential. Being calm and helping reorient the person is also of importance.   Most seizures are painless and end spontaneously. Seizures are not harmful to others but can lead to complications such as stress on the lungs, brain and the heart. Individuals  with prior lung problems may develop labored breathing and respiratory distress.      I have spent a total of  with the patient reviewing hospital notes,  test results, labs and examining the patient as well as establishing an assessment and plan that was discussed personally with the patient's primary team, RN, patient and grandmother at bedside. 50% of time was spent in direct patient care.    Lindie Spruce Epilepsy [facility] For questions after 5pm please refer to AMION to reach the Neurologist on call

## 2021-05-12 NOTE — Progress Notes (Signed)
Patient received in bed, seems calm, off restraint, glucose level over night was 68 at 0245 with no action, this morning was 65, orange juice given and some food, recheck was 97, will continue to monitor.

## 2021-05-13 ENCOUNTER — Other Ambulatory Visit (HOSPITAL_COMMUNITY): Payer: Medicaid Other

## 2021-05-13 ENCOUNTER — Inpatient Hospital Stay (HOSPITAL_COMMUNITY): Payer: Medicaid Other

## 2021-05-13 DIAGNOSIS — R000 Tachycardia, unspecified: Secondary | ICD-10-CM

## 2021-05-13 DIAGNOSIS — F23 Brief psychotic disorder: Secondary | ICD-10-CM | POA: Diagnosis not present

## 2021-05-13 DIAGNOSIS — R569 Unspecified convulsions: Secondary | ICD-10-CM | POA: Diagnosis not present

## 2021-05-13 LAB — BASIC METABOLIC PANEL
Anion gap: 5 (ref 5–15)
BUN: 16 mg/dL (ref 6–20)
CO2: 25 mmol/L (ref 22–32)
Calcium: 8.6 mg/dL — ABNORMAL LOW (ref 8.9–10.3)
Chloride: 110 mmol/L (ref 98–111)
Creatinine, Ser: 0.82 mg/dL (ref 0.44–1.00)
GFR, Estimated: >60 mL/min (ref >60)
Glucose, Bld: 107 mg/dL — ABNORMAL HIGH (ref 70–99)
Potassium: 3.6 mmol/L (ref 3.5–5.1)
Sodium: 140 mmol/L (ref 135–145)

## 2021-05-13 LAB — ECHOCARDIOGRAM COMPLETE
Area-P 1/2: 4.49 cm2
S' Lateral: 2.5 cm
Weight: 1686 oz

## 2021-05-13 MED ORDER — OXCARBAZEPINE 150 MG PO TABS: 150.0000 mg | ORAL_TABLET | Freq: Two times a day (BID) | ORAL | 1 refills | Status: DC

## 2021-05-13 NOTE — Progress Notes (Signed)
   05/13/21 1147  Assess: MEWS Score  Temp 99.2 F (37.3 C)  BP 121/67  Pulse Rate (!) 118  Resp 14  Level of Consciousness Alert  SpO2 100 %  O2 Device Room Air  Assess: MEWS Score  MEWS Temp 0  MEWS Systolic 0  MEWS Pulse 2  MEWS RR 0  MEWS LOC 0  MEWS Score 2  MEWS Score Color Yellow  Assess: if the MEWS score is Yellow or Red  Were vital signs taken at a resting state? Yes  Focused Assessment Change from prior assessment (see assessment flowsheet)  Early Detection of Sepsis Score *See Row Information* Low  MEWS guidelines implemented *See Row Information* Yes  Treat  Pain Scale 0-10  Pain Score 0  Take Vital Signs  Increase Vital Sign Frequency  Yellow: Q 2hr X 2 then Q 4hr X 2, if remains yellow, continue Q 4hrs  Escalate  MEWS: Escalate Yellow: discuss with charge nurse/RN and consider discussing with provider and RRT  Notify: Charge Nurse/RN  Name of Charge Nurse/RN Notified brian  Date Charge Nurse/RN Notified 05/13/21  Time Charge Nurse/RN Notified 1352  Notify: Provider  Provider Name/Title Vicente Serene MD  Date Provider Notified 05/13/21  Time Provider Notified 1210  Notification Type Page  Notification Reason Other (Comment) (Tachy)  Provider response No new orders  Date of Provider Response 05/13/21  Time of Provider Response 1220  Document  Patient Outcome Other (Comment) (will monitor)  Progress note created (see row info) Yes  MD paged and made aware

## 2021-05-13 NOTE — Progress Notes (Signed)
  Echocardiogram 2D Echocardiogram has been performed.  [name] 05/13/2021, 4:35 PM

## 2021-05-13 NOTE — Discharge Instructions (Signed)
Thank you for letting us care for you during your stay.  You were admitted to the [facility] Medicine Teaching Service.   You were admitted for seizures, which improved with taking trileptal 150 mg twice a day. Please continue taking this medication every day. Please follow up with your primary care physician, we will make appointment for you. Please follow up with [facility], call to make an appointment  If your symptoms worsen or return, please return to the hospital.  Please let us know if you have questions about your stay at [facility].

## 2021-05-13 NOTE — Progress Notes (Signed)
2nd call to EEG tech to dc EEG monitoring. Voice message left

## 2021-05-13 NOTE — Progress Notes (Signed)
Patient ambulated in the hall ways assisted by grandmother. No dizziness reported.

## 2021-05-13 NOTE — Progress Notes (Signed)
Family Medicine Teaching Service Daily Progress Note Intern Pager: [phone]  Patient name: [name] Medical record number: [id number] Date of birth: August 19, 2001 Age: 20 y.o. Gender: female  Primary Care Provider: Cora Collum, DO Consultants: Neurology Code Status: Full  Pt Overview and Major Events to Date:  9/7: admitted for seizure work up 9/8: psychosis symptoms noticed 9/9: patient returned to baseline  Assessment and Plan: [name] is a 20 year old woman who presented to the ED for seizure like activity.  Past medical history includes seizure disorder since childhood.  Seizures  Psychosis- resolving Patient is much improved this morning she is at her baseline cognition, no further symptoms of psychosis today.  She is completing her 24-hour EEG.  If PTS appreciates neurology's expertise.  She has not had any seizure activity that we know of so far on oxcarbazepine.  Awaiting final neuro recommendations. -Continue oxcarbazepine -EEG results -Neurology recommendations  Sinus tachycardia On Thursday 9/8, patient had 6 significant tachycardia to the 170s.  EKG revealed that this was sinus tachycardia, no arrhythmias.  Neurology requested an echo, currently pending the test to begin.  Telemetry reviewed, patient only had 1 episode of tachycardia to the 160s for a few seconds last night around 8 PM.  Her vital signs are stable today. Suspect tachycardia due to psychosis, thought most likely due to lamictal side effects.  -Echo  FEN/GI: Regular diet PPx: lovenox Dispo:Home  today pending Neurology recommendations and echo . Barriers include Neurology recommendations and echo.   Subjective:  Patient reports that she had a good night, and is currently hungry.  She is looking forward to going home soon.  Objective: Temp:  [98.3 F (36.8 C)-99 F (37.2 C)] 98.3 F (36.8 C) (09/10 0849) Pulse Rate:  [91-109] 91 (09/10 0849) Resp:  [14-16] 16 (09/09 2007) BP:  (118-137)/(63-90) 118/78 (09/10 0849) SpO2:  [100 %] 100 % (09/10 0849) Physical Exam: General: young AAW, resting comfortably in bed, NAD, WNWD Cardiovascular: rrr, no m/r/g, 2+ radial pulses Respiratory: CTAB, no IWOB, no w/r/r Abdomen: soft, NT, ND Extremities: warm, no edema, 2+ DP Neuro: awake, AOx3, flat affect, returned to baseline cognition  Laboratory: Recent Labs  Lab 05/10/21 1005 05/11/21 0700 05/12/21 0241  WBC 4.3 4.7 3.9*  HGB 12.1 11.8* 12.9  HCT 38.6 36.4 40.6  PLT 333 265 277   Recent Labs  Lab 05/10/21 1005 05/11/21 0700 05/12/21 0241 05/13/21 0246  NA 139 136 138 140  K 3.3* 4.0 4.7 3.6  CL 103 106 108 110  CO2 27 21* 22 25  BUN 8 5* 9 16  CREATININE 0.75 0.66 0.71 0.82  CALCIUM 9.6 9.5 9.6 8.6*  PROT 7.2 7.2  --   --   BILITOT 0.3 1.0  --   --   ALKPHOS 48 41  --   --   ALT 13 13  --   --   AST 20 17  --   --   GLUCOSE 111* 93 68* 107*   Imaging/Diagnostic Tests: No results found.  Shirlean Mylar, MD 05/13/2021, 9:40 AM PGY-3, [facility] FPTS Intern pager: [phone], text pages welcome

## 2021-05-13 NOTE — Progress Notes (Signed)
Patient walked up to nurses station, complained of dizziness. HR max 118. Sats 100% RA

## 2021-05-13 NOTE — Discharge Summary (Addendum)
Family Medicine Teaching [facility] - Inpatient Discharge Summary  Patient name: [name] Medical record number: [id number] Date of birth: 23-Apr-2001 Age: 20 y.o. Gender: female Date of Admission: 05/10/2021  Date of Discharge: 05/13/21 Admitting Physician: Reece Leader, DO  Primary Care Provider: Cora Collum, DO Consultants: Neurology, Psychiatry  Indication for Hospitalization: seizures  Discharge Diagnoses/Problem List:  Seizure disorder Sinus tachycardia  Disposition: to home  Discharge Condition: stable and improved  Discharge Exam:  Temp:  [98.3 F (36.8 C)-99 F (37.2 C)] 98.3 F (36.8 C) (09/10 0849) Pulse Rate:  [91-109] 91 (09/10 0849) Resp:  [14-16] 16 (09/09 2007) BP: (118-137)/(63-90) 118/78 (09/10 0849) SpO2:  [100 %] 100 % (09/10 0849) Physical Exam: General: young AAW, resting comfortably in bed, NAD, WNWD Cardiovascular: rrr, no m/r/g, 2+ radial pulses Respiratory: CTAB, no IWOB, no w/r/r Abdomen: soft, NT, ND Extremities: warm, no edema, 2+ DP Neuro: awake, AOx3, flat affect, returned to baseline cognition  Brief Hospital Course:  [name] is a 20 year old woman to the ED with seizures and symptoms of psychosis after discontinuing lamictal. Past medical history includes seizure disorder since childhood and recently treated chlamydial infection.  Seizure-like activity  Psychosis Patient presented to the ED after having witnessed seizure in her PCP clinic.  She has a longstanding history of seizures.  She had been previously well controlled on Depakote, however started Lamictal approximately 1 month ago as she desired to become pregnant and has continued to have breakthrough seizures.  Additionally Lamictal has caused significant side effects which make it difficult for her to take her medicine. Urine drug screen, ethanol and pregnancy test were negative.  Work-up including her CBC, glucose, TSH, electrolytes noncontributory.  Patient was placed Keppra per  neurology, and given as needed Ativan for seizures lasting greater than 5 minutes.  Patient's first overnight EEG did not show any seizure activity, despite patient having symptoms.  Patient became very agitated during her first night of admission, experiencing auditory and visual hallucinations, responding to internal stimuli.  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  Neurology discontinued Keppra due to worsening psychosis profile, and switched to oxcarbazepine.  With her continued disoriented state and negative EEG neurology ordered MRI which showed subtle asymmetric atrophy involving the mesial left temporal lobe and left hippocampus as compared to the right, without any acute intracranial abnormality identified.  This was followed up by another overnight EEG which was within normal limits.  She was placed on 24-hour video surveillance, to better characterize seizure activity episodes, which was normal.  Likely caused by side effects of lamictal. Patient tolerated oxcarbazepine well and returned to baseline cognition and functioning on 9/9. Neurology outpatient follow up scheduled.  Sinus tachycardia Patient had heart rates of 130s to 170s upon admission.  EKG revealed sinus tachycardia.  Her heart rate stabilized temporarily, but spiked above 100 upon ambulation.  Neurology had concern and primary team ordered echo.  Echo findings WNL. On day of discharge she had mild tachycardia to 100s and was able to tolerate walking without symptoms of dizziness.  Hypokalemia Mild hypokalemia of 3.3 upon admission.  However her potassium corrected to 4 without supplementation.  All other chronic medical conditions stable at time of discharge.  PCP follow-up recommendations 1.)  Follow-up with primary care provider to discuss events of hospitalization, ensure patient doing well with oxcarbazepine 150  mg BID. 2.)  Follow-up with neurology for  seizure management. 3.) Discuss birth control options if desired, chlamydia test of cure.  Significant Procedures: none  Significant Labs and Imaging:  Recent Labs  Lab 05/10/21 1005 05/11/21 0700 05/12/21 0241  WBC 4.3 4.7 3.9*  HGB 12.1 11.8* 12.9  HCT 38.6 36.4 40.6  PLT 333 265 277   Recent Labs  Lab 05/07/21 2314 05/10/21 1005 05/11/21 0700 05/12/21 0241 05/13/21 0246  NA 139 139 136 138 140  K 3.5 3.3* 4.0 4.7 3.6  CL 106 103 106 108 110  CO2 22 27 21* 22 25  GLUCOSE 114* 111* 93 68* 107*  BUN 8 8 5* 9 16  CREATININE 0.81 0.75 0.66 0.71 0.82  CALCIUM 10.0 9.6 9.5 9.6 8.6*  ALKPHOS  --  48 41  --   --   AST  --  20 17  --   --   ALT  --  13 13  --   --   ALBUMIN  --  4.0 3.9  --   --    MR BRAIN W WO CONTRAST  Result Date: 05/11/2021 CLINICAL DATA:  Initial evaluation for acute seizure trauma abnormal neuro exam. EXAM: MRI HEAD WITHOUT AND WITH CONTRAST TECHNIQUE: Multiplanar, multiecho pulse sequences of the brain and surrounding structures were obtained without and with intravenous contrast. CONTRAST:  4.715mL GADAVIST GADOBUTROL 1 MMOL/ML IV SOLN COMPARISON:  Prior CT from 11/21/2018. FINDINGS: Brain: Cerebral volume within normal limits for age. No focal parenchymal signal abnormality. No abnormal foci of restricted diffusion to suggest acute or subacute ischemia or changes related to seizure. No foci of susceptibility artifact to suggest acute or chronic intracranial hemorrhage. No encephalomalacia to suggest chronic cortical infarction or other insult. No mass lesion, midline shift or mass effect. No hydrocephalus or extra-axial fluid collection. No visible cortical dysplasia or other migrational abnormality. Pituitary gland suprasellar region within normal limits. Midline structures intact and normal. Thin section imaging through the temporal lobes was performed. There is subtle asymmetric atrophy involving the mesial left  temporal lobe and left hippocampus as compared to the right (series 17, image 16). No convincing corresponding signal abnormality as is typically seen with mesial temporal sclerosis. No abnormal enhancement within this location or elsewhere within the brain. Vascular: Major intracranial vascular flow voids are maintained. Skull and upper cervical spine: Minimal cerebellar tonsillar ectopia without frank Chiari malformation noted. Craniocervical junction otherwise unremarkable. Visualized upper cervical spine normal. Bone marrow signal intensity within normal limits. No scalp soft tissue abnormality. Sinuses/Orbits: Globes and orbital soft tissues within normal limits. Mild scattered mucosal thickening noted within the ethmoidal air cells and maxillary sinuses. Paranasal sinuses are otherwise clear. No mastoid effusion. Inner ear structures grossly normal. Other: None. IMPRESSION: 1. Subtle asymmetric atrophy involving the mesial left temporal lobe and left hippocampus as compared to the right. No convincing corresponding signal abnormality as is typically seen with mesial temporal sclerosis. Correlation with EEG recommended. 2. Otherwise normal brain MRI. No other acute intracranial abnormality identified. Electronically Signed   By: Rise MuBenjamin  McClintock M.D.   On: 05/11/2021 19:11   Overnight EEG with video  Result Date: 05/11/2021 Charlsie QuestYadav, Priyanka O, MD     05/11/2021  8:54 AM Patient Name: Ennis Fortsmane Boehne MRN: [id number] Epilepsy Attending: Charlsie QuestPriyanka O Yadav Referring Physician/Provider: Dr Lindie SprucePriyanka Yadav Duration: 05/10/2021 1748 to 05/11/2021 0530 Patient history: 20 year old female with history of epilepsy who presented with breakthrough seizures in the setting of medication noncompliance due to side effects.  EEG to evaluate for seizures. Level of  alertness: Awake AEDs during EEG study: LEV Technical aspects: This EEG study was done with scalp electrodes positioned according to the 10-20 International system of  electrode placement. Electrical activity was acquired at a sampling rate of 500Hz  and reviewed with a high frequency filter of 70Hz  and a low frequency filter of 1Hz . EEG data were recorded continuously and digitally stored. Description: The posterior dominant rhythm consists of 8 Hz activity of moderate voltage (25-35 uV) seen predominantly in posterior head regions, symmetric and reactive to eye opening and eye closing. Hyperventilation and photic stimulation were not performed.   Of note, patient pulled out her electrodes around 5 AM on 05/11/2021 after which this study was not interpretable. IMPRESSION: This study is within normal limits. No seizures or epileptiform discharges were seen throughout the recording.   ECHOCARDIOGRAM COMPLETE  Result Date: 05/13/2021    ECHOCARDIOGRAM REPORT   Patient Name:   CONLEIGH HEINLEIN Date of Exam: 05/13/2021 Medical Rec #:  07/13/2021    Height:       64.0 in Accession #:    [name]   Weight:       105.4 lb Date of Birth:  March 17, 2001    BSA:          1.490 m Patient Age:    20 years     BP:           137/90 mmHg Patient Gender: F            HR:           111 bpm. Exam Location:  Inpatient Procedure: 2D Echo Indications:    tachycardia  History:        Patient has no prior history of Echocardiogram examinations.                 Signs/Symptoms:seizure.  Sonographer:    [id number] RDCS Referring Phys: [phone] BRITTANY J MCINTYRE IMPRESSIONS  1. Left ventricular ejection fraction, by estimation, is 60 to 65%. The left ventricle has normal function. The left ventricle has no regional wall motion abnormalities. Left ventricular diastolic parameters were normal.  2. Right ventricular systolic function is normal. The right ventricular size is normal.  3. The mitral valve is normal in structure. No evidence of mitral valve regurgitation. No evidence of mitral stenosis.  4. The aortic valve is normal in structure. Aortic valve regurgitation is not visualized. No  aortic stenosis is present.  5. The inferior vena cava is normal in size with greater than 50% respiratory variability, suggesting right atrial pressure of 3 mmHg. FINDINGS  Left Ventricle: Left ventricular ejection fraction, by estimation, is 60 to 65%. The left ventricle has normal function. The left ventricle has no regional wall motion abnormalities. The left ventricular internal cavity size was normal in size. There is  no left ventricular hypertrophy. Left ventricular diastolic parameters were normal. Right Ventricle: The right ventricular size is normal. Right ventricular systolic function is normal. Left Atrium: Left atrial size was normal in size. Right Atrium: Right atrial size was normal in size. Pericardium: There is no evidence of pericardial effusion. Mitral Valve: The mitral valve is normal in structure. No evidence of mitral valve regurgitation. No evidence of mitral valve stenosis. Tricuspid Valve: The tricuspid valve is normal in structure. Tricuspid valve regurgitation is trivial. No evidence of tricuspid stenosis. Aortic Valve: The aortic valve is normal in structure. Aortic valve regurgitation is not visualized. No aortic stenosis is present. Pulmonic Valve: The pulmonic valve was  normal in structure. Pulmonic valve regurgitation is not visualized. No evidence of pulmonic stenosis. Aorta: The aortic root is normal in size and structure. Venous: The inferior vena cava is normal in size with greater than 50% respiratory variability, suggesting right atrial pressure of 3 mmHg. IAS/Shunts: No atrial level shunt detected by color flow Doppler.  LEFT VENTRICLE PLAX 2D LVIDd:         4.10 cm  Diastology LVIDs:         2.50 cm  LV e' medial:    12.60 cm/s LV PW:         0.80 cm  LV E/e' medial:  8.2 LV IVS:        0.70 cm  LV e' lateral:   16.50 cm/s LVOT diam:     1.80 cm  LV E/e' lateral: 6.2 LV SV:         48 LV SV Index:   32 LVOT Area:     2.54 cm  RIGHT VENTRICLE             IVC RV S prime:      12.90 cm/s  IVC diam: 1.30 cm TAPSE (M-mode): 1.7 cm LEFT ATRIUM             Index       RIGHT ATRIUM          Index LA diam:        2.50 cm 1.68 cm/m  RA Area:     7.21 cm LA Vol (A2C):   24.0 ml 16.11 ml/m RA Volume:   11.40 ml 7.65 ml/m LA Vol (A4C):   14.0 ml 9.40 ml/m LA Biplane Vol: 18.3 ml 12.28 ml/m  AORTIC VALVE LVOT Vmax:   134.00 cm/s LVOT Vmean:  90.000 cm/s LVOT VTI:    0.187 m  AORTA Ao Root diam: 2.30 cm Ao Asc diam:  2.10 cm MITRAL VALVE MV Area (PHT): 4.49 cm     SHUNTS MV Decel Time: 169 msec     Systemic VTI:  0.19 m MV E velocity: 103.00 cm/s  Systemic Diam: 1.80 cm MV A velocity: 94.00 cm/s MV E/A ratio:  1.10 Olga Millers MD Electronically signed by Olga Millers MD Signature Date/Time: 05/13/2021/5:39:22 PM    Final      Results/Tests Pending at Time of Discharge: none  Discharge Medications:  Allergies as of 05/13/2021       Reactions   Lamotrigine    Dizziness, Hallucinations   Codeine    Keppra [levetiracetam]    psychosis   Latex    Tdap [tetanus-diphth-acell Pertussis]         Medication List     TAKE these medications    ondansetron 4 MG disintegrating tablet Commonly known as: Zofran ODT Take 1 tablet (4 mg total) by mouth every 8 (eight) hours as needed for nausea or vomiting.   OXcarbazepine 150 MG tablet Commonly known as: TRILEPTAL Take 1 tablet (150 mg total) by mouth 2 (two) times daily.        Discharge Instructions: Please refer to Patient Instructions section of EMR for full details.  Patient was counseled important signs and symptoms that should prompt return to medical care, changes in medications, dietary instructions, activity restrictions, and follow up appointments.   Follow-Up Appointments:  Follow-up Information     [facility]. Go on 06/20/2021.   Why: Please go to your scheduled appointment at 8am, please arrive 15 minutes prior to your appointment. Contact information: [address]     [address] [place] [address] [phone]        [facility] Follow up on 05/23/2021.   Why: Please arrive 15 minutes early for a 1:55 PM appointment Contact information: [address] [place] [address] [phone]                Shirlean Mylar, MD 05/13/2021, 5:47 PM PGY-3, [facility]

## 2021-05-14 NOTE — Procedures (Signed)
Patient Name: [name]  MRN: [id number]  Epilepsy Attending: Charlsie Quest  Referring Physician/Provider: Dr Lindie Spruce  Duration: 05/12/2021 2210 to 05/13/2021 1142   Patient history: 20 year old female with history of epilepsy who presented with breakthrough seizures in the setting of medication noncompliance due to side effects.  EEG to evaluate for seizures.   Level of alertness: Awake, asleep   AEDs during EEG study: LEV   Technical aspects: This EEG study was done with scalp electrodes positioned according to the 10-20 International system of electrode placement. Electrical activity was acquired at a sampling rate of 500Hz  and reviewed with a high frequency filter of 70Hz  and a low frequency filter of 1Hz . EEG data were recorded continuously and digitally stored.    Description: The posterior dominant rhythm consists of 8-9 Hz activity of moderate voltage (25-35 uV) seen predominantly in posterior head regions, symmetric and reactive to eye opening and eye closing.  Sleep was characterized by vertex waves, sleep spindles (12 to 14 Hz), maximal frontocentral region.  Hyperventilation and photic stimulation were not performed.      IMPRESSION: This study is within normal limits. No seizures or epileptiform discharges were seen throughout the recording.   [name] 

## 2021-05-15 ENCOUNTER — Telehealth: Payer: Self-pay

## 2021-05-15 NOTE — Telephone Encounter (Signed)
Transition Care Management Unsuccessful Follow-up Telephone Call  Date of discharge and from where:  05/13/2021  Attempts:  1st Attempt  Reason for unsuccessful TCM follow-up call:  Left voice message

## 2021-05-16 NOTE — Telephone Encounter (Signed)
Transition Care Management Unsuccessful Follow-up Telephone Call  Date of discharge and from where:  09/10/202 from [facility]  Attempts:  2nd Attempt  Reason for unsuccessful TCM follow-up call:  Left voice message

## 2021-05-17 NOTE — Telephone Encounter (Signed)
.  Transition Care Management Unsuccessful Follow-up Telephone Call  Date of discharge and from where:  05/13/2021-  Attempts:  3rd Attempt  Reason for unsuccessful TCM follow-up call:  Left voice message

## 2021-05-23 ENCOUNTER — Other Ambulatory Visit: Payer: Self-pay

## 2021-05-23 ENCOUNTER — Ambulatory Visit (INDEPENDENT_AMBULATORY_CARE_PROVIDER_SITE_OTHER): Payer: Medicaid Other | Admitting: Family Medicine

## 2021-05-23 VITALS — BP 115/72 | HR 92 | Ht 64.0 in | Wt 109.2 lb

## 2021-05-23 DIAGNOSIS — R569 Unspecified convulsions: Secondary | ICD-10-CM | POA: Diagnosis not present

## 2021-05-23 DIAGNOSIS — F23 Brief psychotic disorder: Secondary | ICD-10-CM

## 2021-05-23 NOTE — Patient Instructions (Addendum)
Oxcarbazepine tablets What is this medication? OXCARBAZEPINE (ox car BAZ e peen) is used to treat people with epilepsy. It helps prevent partial seizures. This medicine may be used for other purposes; ask your health care provider or pharmacist if you have questions. COMMON BRAND NAME(S): Trileptal What should I tell my care team before I take this medication? They need to know if you have any of these conditions: Asian ancestry kidney disease liver disease suicidal thoughts, plans, or attempt; a previous suicide attempt by you or a family member any unusual or allergic reaction to oxcarbazepine, carbamazepine, other medicines, foods, dyes, or preservatives pregnant or trying to get pregnant breast-feeding How should I use this medication? Take this medicine by mouth with a glass of water. Follow the directions on the prescription label. This medicine may be taken with or without food. Take your doses at regular intervals. Do not take your medicine more often than directed. Do not stop taking except on the advice of your doctor or health care professional. A special MedGuide will be given to you by the pharmacist with each prescription and refill. Be sure to read this information carefully each time. Talk to your pediatrician regarding the use of this medicine in children. While this medicine may be prescribed for children as young as 2 years for selected conditions, precautions do apply. Overdosage: If you think you have taken too much of this medicine contact a poison control center or emergency room at once. NOTE: This medicine is only for you. Do not share this medicine with others. What if I miss a dose? If you miss a dose, take it as soon as you can. If it is almost time for your next dose, take only that dose. Do not take double or extra doses. What may interact with this medication? Do not take this medicine with any of the following medications: carbamazepine This medicine may also  interact with the following medications: birth control pills certain medicines for seizures like phenobarbital, phenytoin, valproic acid certain medicines for high blood pressure like felodipine, diltiazem, verapamil cyclosporine This list may not describe all possible interactions. Give your health care provider a list of all the medicines, herbs, non-prescription drugs, or dietary supplements you use. Also tell them if you smoke, drink alcohol, or use illegal drugs. Some items may interact with your medicine. What should I watch for while using this medication? Visit your doctor or health care provider for regular checks on your progress. Do not stop taking this medicine suddenly. This increases the risk of seizures. Wear a Medic Alert bracelet or necklace. Carry an identification card with information about your condition, medications, and doctor or health care provider. This medicine may cause serious skin reactions. They can happen weeks to months after starting the medicine. Contact your health care provider right away if you notice fevers or flu-like symptoms with a rash. The rash may be red or purple and then turn into blisters or peeling of the skin. Or, you might notice a red rash with swelling of the face, lips or lymph nodes in your neck or under your arms. Rarely, serious skin allergic reactions may occur with this medicine. If you develop a skin rash, redness, itching, peeling skin inside your mouth, swollen glands, or a fever while taking this medicine, contact your health care provider immediately. You may get drowsy or dizzy. Do not drive, use machinery, or do anything that needs mental alertness until you know how this drug affects you. Do not stand   or sit up quickly, especially if you are an older patient. This reduces the risk of dizzy or fainting spells. Alcohol can make you more drowsy and dizzy. Avoid alcoholic drinks. Birth control pills may not work properly while you are taking  this medicine. Talk to your doctor about using an extra method of birth control. The use of this medicine may increase the chance of suicidal thoughts or actions. Pay special attention to how you are responding while on this medicine. Any worsening of mood, or thoughts of suicide or dying should be reported to your health care provider right away. Women who become pregnant while using this medicine may enroll in the [place] Antiepileptic Drug Pregnancy Registry by calling 1-888-233-2334. This registry collects information about the safety of antiepileptic drug use during pregnancy. What side effects may I notice from receiving this medication? Side effects that you should report to your doctor or health care professional as soon as possible: allergic reactions such as skin rash or itching, hives, swelling of the lips, mouth, tongue, or throat changes in vision confusion difficulty passing urine or change in the amount of urine infection nausea, vomiting problems with balance, speaking, walking rash, fever, and swollen lymph nodes redness, blistering, peeling or loosening of the skin, including inside the mouth swelling of feet, hands unusual bleeding, bruising unusually weak or tired worsening of mood, thoughts or actions of suicide or dying yellowing of eyes, skin Side effects that usually do not require medical attention (report to your doctor or health care professional if they continue or are bothersome): constipation or diarrhea headache loss of appetite nervous stomach upset tremors trouble sleeping This list may not describe all possible side effects. Call your doctor for medical advice about side effects. You may report side effects to FDA at 1-800-FDA-1088. Where should I keep my medication? Keep out of reach of children. Store at room temperature between 15 and 30 degrees C (59 and 86 degrees F). Keep container tightly closed. Throw away any unused medicine after the  expiration date. NOTE: This sheet is a summary. It may not cover all possible information. If you have questions about this medicine, talk to your doctor, pharmacist, or health care provider.  2022 Elsevier/Gold Standard (2018-11-26 10:55:30)  

## 2021-05-23 NOTE — Progress Notes (Addendum)
    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  Patient been previously well controlled on Depakote and switched to Lamictal, when attempting to switch to Keppra while in the hospital patient began having hallucinations Keppra was discontinued due to the psychosis profile and switch to oxcarbazepine.  Patient reports she is doing much better with his new medication.  Grandmother does note that she is having issues with insomnia and will go to bed.  She is getting around melatonin at night which sometimes helps.  Patient notes that she did not go to bed until about 3 AM.   Grandmother is little bit concerned because she is still noticed an increase of aggressiveness towards the family.  Patient reports that she is always been this aggressive and there is no concerns.  Patient upon further questioning, did note that she does have feelings of "my heart beating in my abdomen".  She is not willing to talk as much and had difficulty with describing the sensations otherwise.  She is unsure if this is a feeling of anxiety or if this is related to something else.  When asked if she was having any hallucinations she said that she did not know.  Patient declined birth control   PERTINENT  PMH / PSH: Seizures, post-ictal psychosis  OBJECTIVE:   BP 115/72   Pulse 92   Ht 5\' 4"  (1.626 m)   Wt 109 lb 4 oz (49.6 kg)   LMP 05/18/2021   SpO2 100%   BMI 18.75 kg/m   General: NAD, well-appearing, well-nourished Respiratory: No respiratory distress, breathing comfortably, able to speak in full sentences Skin: warm and dry, no rashes noted on exposed skin Psych: Patient seemed easily irritated and constantly ready to leave, would avoid eye contact with provider and was constantly looking around the room.   ASSESSMENT/PLAN:   Seizures Patient with no seizure-like  activity since hospitalization.  Currently on oxcarbazepine with improvement compared to side effects with Lamictal.  Patient is following up with neurology outpatient for further monitoring and medication adjustment.   History of psychosis while in the hospital Patient with documented post ictal psychosis in the hospital followed by a period of visual hallucinations and responses to internal stimuli when on Keppra.  Patient does not report any hallucinations at this time, though she notes that she is unsure of this, the family does not note any responses to internal stimuli.  Patient's demeanor in the clinic was a bit concerning and a little bit agitated, though unsure if because grandmother was present or if because provider was not PCP.  If continued concerns about behaviors, may need to consider if patient would benefit from therapy and/or psychiatry. Patient was not receptive to psych referral during visit.  05/20/2021, DO [facility]

## 2021-05-23 NOTE — Assessment & Plan Note (Signed)
Patient with no seizure-like activity since hospitalization.  Currently on oxcarbazepine with improvement compared to side effects with Lamictal.  Patient is following up with neurology outpatient for further monitoring and medication adjustment.

## 2021-06-01 ENCOUNTER — Ambulatory Visit: Payer: Medicaid Other

## 2021-06-02 ENCOUNTER — Encounter: Payer: Self-pay | Admitting: Neurology

## 2021-06-02 ENCOUNTER — Ambulatory Visit: Payer: Medicaid Other | Admitting: Neurology

## 2021-06-02 VITALS — BP 117/54 | HR 79 | Ht 63.0 in | Wt 114.0 lb

## 2021-06-02 DIAGNOSIS — G40909 Epilepsy, unspecified, not intractable, without status epilepticus: Secondary | ICD-10-CM

## 2021-06-02 MED ORDER — OXCARBAZEPINE 150 MG PO TABS: 150.0000 mg | ORAL_TABLET | Freq: Two times a day (BID) | ORAL | 12 refills | Status: DC

## 2021-06-02 MED ORDER — GABAPENTIN 300 MG PO CAPS: 300.0000 mg | ORAL_CAPSULE | Freq: Every day | ORAL | 11 refills | Status: DC

## 2021-06-02 MED ORDER — FOLIC ACID 1 MG PO TABS: 4.0000 mg | ORAL_TABLET | Freq: Every day | ORAL | 12 refills | Status: AC

## 2021-06-02 NOTE — Patient Instructions (Signed)
Continue Oxcarbazepine 150 mg twice a day  Start Gabapentin 300 mg nightly  Check Oxcarbazepine level and BMP today  Return in 4 weeks

## 2021-06-02 NOTE — Progress Notes (Signed)
[facility]  PATIENT: [name] DOB: [birth year]  REFERRING CLINICIAN: Cora Collum, DO HISTORY FROM: Patient/Grandmother and chart review  REASON FOR VISIT: Seizures    HISTORICAL  CHIEF COMPLAINT:  Chief Complaint  Patient presents with   New Patient (Initial Visit)    Seizures; Patient is upset that she is here. She has had them since she was 2 months old. She takes Oxcarbazepine 150 mg BID. Last seizure was 2 weeks ago.   Room 13    Here with grandmother, sisters and brothers.    HISTORY OF PRESENT ILLNESS:  This is a 20 year old woman with past medical history of seizures, anxiety who is presenting after hospital discharge for breakthrough seizure.  Patient was admitted from September 7 September 10 after having a seizure at her outpatient visit.  During her admission, she was trialed on levetiracetam which caused acute psychosis, she was seen by psychiatry, levetiracetam discontinued and patient was started on oxcarbazepine with return to baseline mental status.  Hospital course listed below.  She had a video EEG which did not show any seizure even though patient report of the leads.  She also had a brain MRI which shows subtle asymmetric atrophic involving the mesial left temporal lobe and left hippocampus as compared to the right.  She reports since being discharged from the hospital she has not had any additional seizures.  She is compliant with her oxcarbazepine 150 mg twice a day, sometimes she forgets to take the medication early morning but she will take it in the early afternoon.  Denies any additional breakthrough seizures but report nightly headaches.  She is taking Tylenol and ibuprofen for headaches.  Currently she does not have any complaint.  We discussed about pregnancy and she says she is not using any birth control pills and is considering getting pregnant.  When grandmother attempting to provide additional history, patient got upset and left  the room.     Hospital discharge summary below  Patient presented to the ED after having witnessed seizure in her PCP clinic.  She has a longstanding history of seizures.  She had been previously well controlled on Depakote, however started Lamictal approximately 1 month ago as she desired to become pregnant and has continued to have breakthrough seizures.  Additionally Lamictal has caused significant side effects which make it difficult for her to take her medicine. Urine drug screen, ethanol and pregnancy test were negative.  Work-up including her CBC, glucose, TSH, electrolytes noncontributory.  Patient was placed Keppra per neurology, and given as needed Ativan for seizures lasting greater than 5 minutes.  Patient's first overnight EEG did not show any seizure activity, despite patient having symptoms.  Patient became very agitated during her first night of admission, experiencing auditory and visual hallucinations, responding to internal stimuli.  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  Neurology discontinued Keppra due to worsening psychosis profile, and switched to oxcarbazepine.  With her continued disoriented state and negative EEG neurology ordered MRI which showed subtle asymmetric atrophy involving the mesial left temporal lobe and left hippocampus as compared to the right, without any acute intracranial abnormality identified.  This was followed up by another overnight EEG which was within normal limits.  She was placed on 24-hour video surveillance, to better characterize seizure activity episodes, which was normal.  Likely caused by side effects of lamictal. Patient tolerated oxcarbazepine well  and returned to baseline cognition and functioning on 9/9. Neurology outpatient follow up scheduled.   Handedness: Right   Seizure Type: ?unclear, described as generalized tonic clonic    Current frequency: 2 to 3 per months but improved since Oxcarbazepine was started   Any injuries from seizures: None reported   Seizure risk factors: Family history of seizures   Previous ASMs: Valproic acid, Levetiracetam, Lamotrigine and Oxcarbazepine   Currenty ASMs: Oxcarbazepine 150mg  BID   ASMs side effects: headaches   Brain Images: Subtle asymmetric atrophy involving the mesial left temporal lobe and left hippocampus as compared to the right  Previous EEGs: Normal    OTHER MEDICAL CONDITIONS: Anxiety, depression, memory problems   REVIEW OF SYSTEMS: Full 14 system review of systems performed and negative with exception of: as noted in the HPI  ALLERGIES: Allergies  Allergen Reactions   Lamotrigine     Dizziness, Hallucinations   Codeine    Keppra [Levetiracetam]     psychosis   Latex    Tdap [Tetanus-Diphth-Acell Pertussis]     HOME MEDICATIONS: Outpatient Medications Prior to Visit  Medication Sig Dispense Refill   OXcarbazepine (TRILEPTAL) 150 MG tablet Take 1 tablet (150 mg total) by mouth 2 (two) times daily. 60 tablet 1   ondansetron (ZOFRAN ODT) 4 MG disintegrating tablet Take 1 tablet (4 mg total) by mouth every 8 (eight) hours as needed for nausea or vomiting. (Patient not taking: Reported on 06/02/2021) 10 tablet 0   No facility-administered medications prior to visit.    PAST MEDICAL HISTORY: Past Medical History:  Diagnosis Date   Seizure (HCC)    Seizures (HCC)    Well child check 04/26/2011    PAST SURGICAL HISTORY: Past Surgical History:  Procedure Laterality Date   LEG SURGERY      FAMILY HISTORY: Family History  Problem Relation Age of Onset   Healthy Mother    Seizures Father    Seizures Sister     SOCIAL HISTORY: Social History   Socioeconomic History   Marital status: Single    Spouse name: Not on file   Number of children: Not on file   Years of education: Not on file   Highest education level: Not on file   Occupational History   Not on file  Tobacco Use   Smoking status: Never   Smokeless tobacco: Never  Substance and Sexual Activity   Alcohol use: No   Drug use: No   Sexual activity: Yes    Comment: states implant has expired   Other Topics Concern   Not on file  Social History Narrative   Lives at home between 04/28/2011 house and grandmother's house    Social Determinants of Health   Financial Resource Strain: Not on file  Food Insecurity: Not on file  Transportation Needs: Not on file  Physical Activity: Not on file  Stress: Not on file  Social Connections: Not on file  Intimate Partner Violence: Not on file     PHYSICAL EXAM  GENERAL EXAM/CONSTITUTIONAL: Vitals:  Vitals:   06/02/21 1043  BP: (!) 117/54  Pulse: 79  Weight: 114 lb (51.7 kg)  Height: 5\' 3"  (1.6 m)   Body mass index is 20.19 kg/m. Wt Readings from Last 3 Encounters:  06/02/21 114 lb (51.7 kg)  05/23/21 109 lb 4 oz (49.6 kg)  05/10/21 105 lb 6 oz (47.8 kg)   Patient is in no distress; well developed, nourished and groomed; neck is supple  MUSCULOSKELETAL: Gait, strength, tone,  movements noted in Neurologic exam below  NEUROLOGIC: MENTAL STATUS:  No flowsheet data found. awake, alert, oriented to person, place and time recent and remote memory intact normal attention and concentration language fluent, comprehension intact, naming intact fund of knowledge appropriate   MOTOR:  normal bulk and tone, full strength in the BUE, BLE   GAIT/STATION:  normal     DIAGNOSTIC DATA (LABS, IMAGING, TESTING) - I reviewed patient records, labs, notes, testing and imaging myself where available.  Lab Results  Component Value Date   WBC 3.9 (L) 05/12/2021   HGB 12.9 05/12/2021   HCT 40.6 05/12/2021   MCV 90.8 05/12/2021   PLT 277 05/12/2021      Component Value Date/Time   NA 140 05/13/2021 0246   K 3.6 05/13/2021 0246   CL 110 05/13/2021 0246   CO2 25 05/13/2021 0246   GLUCOSE 107  (H) 05/13/2021 0246   BUN 16 05/13/2021 0246   CREATININE 0.82 05/13/2021 0246   CREATININE 0.67 03/28/2016 0858   CALCIUM 8.6 (L) 05/13/2021 0246   PROT 7.2 05/11/2021 0700   ALBUMIN 3.9 05/11/2021 0700   AST 17 05/11/2021 0700   ALT 13 05/11/2021 0700   ALKPHOS 41 05/11/2021 0700   BILITOT 1.0 05/11/2021 0700   GFRNONAA >60 05/13/2021 0246   GFRNONAA SEE NOTE 03/28/2016 0858   GFRAA >60 11/21/2018 2104   GFRAA SEE NOTE 03/28/2016 0858   No results found for: CHOL, HDL, LDLCALC, LDLDIRECT, TRIG No results found for: HGBA1C No results found for: VITAMINB12 Lab Results  Component Value Date   TSH 1.234 05/10/2021    Brain MRI  1. Subtle asymmetric atrophy involving the mesial left temporal lobe and left hippocampus as compared to the right. No convincing corresponding signal abnormality as is typically seen with mesial temporal sclerosis. Correlation with EEG recommended. 2. Otherwise normal brain MRI. No other acute intracranial abnormality identified.   EEG  Description: The posterior dominant rhythm consists of 8 Hz activity of moderate voltage (25-35 uV) seen predominantly in posterior head regions, symmetric and reactive to eye opening and eye closing. Hyperventilation and photic stimulation were not performed.      Of note, patient pulled out her electrodes around 5 AM on 05/11/2021 after which this study was not interpretable.   IMPRESSION: This study is within normal limits. No seizures or epileptiform discharges were seen throughout the recording.   I personally reviewed brain Images and previous EEG reports.     ASSESSMENT AND PLAN  19 y.o. year old female  with long history of seizure disorder who is presenting for follow-up.  Patient was recently discharged from the hospital after being admitted for breakthrough seizures.  At that time her medication was changed to oxcarb 150 mg twice a day after she had a reaction to levetiracetam.  She was previously on Lamictal  which did not control her seizures.  Per grandmother patient was doing well while on Depakote but she wanted to get pregnant and remove her birth control barrier and have a provider change her Depakote to Lamictal.  Currently she is still considering pregnancy, I discussed strongly about birth control mechanism like a IUD but she reported she is not interested.  I also recommended her to take folate 4 mg if she is considering pregnancy.  In the past 2 weeks she has not had any seizures at home on oxcarbazepine 150 mg twice daily.  I will obtain a level today and a BMP to check for her  sodium and I will bring her back in 4 weeks. I will also prescribed her Gabapentin 300 mg to take nightly for her headaches.   1. Seizure disorder (HCC)     PLAN: Continue Oxcarbazepine 150 mg twice a day  Start Gabapentin 300 mg nightly  Check Oxcarbazepine level and BMP today  Return in 4 weeks     Per [facility] statutes, patients with seizures are not allowed to drive until they have been seizure-free for six months.  Other recommendations include using caution when using heavy equipment or power tools. Avoid working on ladders or at heights. Take showers instead of baths.  Do not swim alone.  Ensure the water temperature is not too high on the home water heater. Do not go swimming alone. Do not lock yourself in a room alone (i.e. bathroom). When caring for infants or small children, sit down when holding, feeding, or changing them to minimize risk of injury to the child in the event you have a seizure. Maintain good sleep hygiene. Avoid alcohol.  Also recommend adequate sleep, hydration, good diet and minimize stress.   During the Seizure  - First, ensure adequate ventilation and place patients on the floor on their left side  Loosen clothing around the neck and ensure the airway is patent. If the patient is clenching the teeth, do not force the mouth open with any object as this can cause severe  damage - Remove all items from the surrounding that can be hazardous. The patient may be oblivious to what's happening and may not even know what he or she is doing. If the patient is confused and wandering, either gently guide him/her away and block access to outside areas - Reassure the individual and be comforting - Call 911. In most cases, the seizure ends before EMS arrives. However, there are cases when seizures may last over 3 to 5 minutes. Or the individual may have developed breathing difficulties or severe injuries. If a pregnant patient or a person with diabetes develops a seizure, it is prudent to call an ambulance. - Finally, if the patient does not regain full consciousness, then call EMS. Most patients will remain confused for about 45 to 90 minutes after a seizure, so you must use judgment in calling for help. - Avoid restraints but make sure the patient is in a bed with padded side rails - Place the individual in a lateral position with the neck slightly flexed; this will help the saliva drain from the mouth and prevent the tongue from falling backward - Remove all nearby furniture and other hazards from the area - Provide verbal assurance as the individual is regaining consciousness - Provide the patient with privacy if possible - Call for help and start treatment as ordered by the caregiver   After the Seizure (Postictal Stage)  After a seizure, most patients experience confusion, fatigue, muscle pain and/or a headache. Thus, one should permit the individual to sleep. For the next few days, reassurance is essential. Being calm and helping reorient the person is also of importance.  Most seizures are painless and end spontaneously. Seizures are not harmful to others but can lead to complications such as stress on the lungs, brain and the heart. Individuals with prior lung problems may develop labored breathing and respiratory distress.     Orders Placed This Encounter   Procedures   10-Hydroxycarbazepine   Basic Metabolic Panel    Meds ordered this encounter  Medications   OXcarbazepine (TRILEPTAL)  150 MG tablet    Sig: Take 1 tablet (150 mg total) by mouth 2 (two) times daily.    Dispense:  60 tablet    Refill:  12   gabapentin (NEURONTIN) 300 MG capsule    Sig: Take 1 capsule (300 mg total) by mouth at bedtime.    Dispense:  30 capsule    Refill:  11    Return in about 4 weeks (around 06/30/2021).    Windell Norfolk, MD 06/02/2021, 12:05 PM  [facility] [address], [address] [place], [place] [address] [phone]

## 2021-06-04 LAB — BASIC METABOLIC PANEL
BUN/Creatinine Ratio: 16 (ref 9–23)
BUN: 13 mg/dL (ref 6–20)
CO2: 21 mmol/L (ref 20–29)
Calcium: 9.6 mg/dL (ref 8.7–10.2)
Chloride: 104 mmol/L (ref 96–106)
Creatinine, Ser: 0.8 mg/dL (ref 0.57–1.00)
Glucose: 108 mg/dL — ABNORMAL HIGH (ref 70–99)
Potassium: 4.4 mmol/L (ref 3.5–5.2)
Sodium: 141 mmol/L (ref 134–144)
eGFR: 107 mL/min/{1.73_m2} (ref >59)

## 2021-06-04 LAB — 10-HYDROXYCARBAZEPINE: Oxcarbazepine SerPl-Mcnc: 8 ug/mL — ABNORMAL LOW (ref 10–35)

## 2021-06-07 NOTE — Progress Notes (Deleted)
    SUBJECTIVE:   CHIEF COMPLAINT / HPI:   Cough/congestion: 20 year old female presenting with cough and congestion.  STD testing: Patient also requesting STD testing.  She states***.  PERTINENT  PMH / PSH: ***  OBJECTIVE:   LMP 05/18/2021    General: NAD, pleasant, able to participate in exam HEENT: No pharyngeal erythema,*** Cardiac: RRR, no murmurs. Respiratory: CTAB, normal effort, No wheezes, rales or rhonchi Abdomen: Bowel sounds present, nontender Pelvic:***Chaperone:*** Skin: warm and dry, no rashes noted  ASSESSMENT/PLAN:   No problem-specific Assessment & Plan notes found for this encounter.    Assessment: 20 y.o. female with symptoms consistent with a viral upper respiratory tract infection.  Patient does not have any concerning symptoms.  No shortness of breath***.  Overall differential can include seasonal allergies with postnasal drip leading to the cough and runny nose versus viral URI which can include common cold, influenza, COVID-19, or number of other respiratory viruses.  Unlikely bacterial at this time though the patient could potentially develop sinus infection due to congestion. Plan: -We will send for COVID-19 testing -Discussed return precautions -Discussed symptomatic treatment and the lack of need for antibiotics.  ***  Jackelyn Poling, DO [facility]

## 2021-06-09 ENCOUNTER — Ambulatory Visit: Payer: Medicaid Other

## 2021-06-20 ENCOUNTER — Ambulatory Visit: Payer: Medicaid Other | Admitting: Neurology

## 2021-06-28 ENCOUNTER — Ambulatory Visit: Payer: Medicaid Other

## 2021-06-28 NOTE — Progress Notes (Deleted)
    SUBJECTIVE:   CHIEF COMPLAINT / HPI: pregnancy/STI testing  Seizure disorder: patient with long h/o seizure disorder, hospitalized during second week of September 2022. Currently on oxcarbazepine 150 mg qd. Patient reports adherence with her medication, denies any seizures since hospital discharge. She has a follow up with neurology next week on 11/2.  Pregnancy/STI testing: patient is interested in becoming pregnant. She is*** taking PNV.   PERTINENT  PMH / PSH: ***  OBJECTIVE:   There were no vitals taken for this visit.  ***  ASSESSMENT/PLAN:   No problem-specific Assessment & Plan notes found for this encounter.     Shirlean Mylar, MD [facility]

## 2021-07-02 ENCOUNTER — Other Ambulatory Visit: Payer: Self-pay | Admitting: Neurology

## 2021-07-05 ENCOUNTER — Ambulatory Visit: Payer: Medicaid Other | Admitting: Neurology

## 2021-07-05 ENCOUNTER — Ambulatory Visit (INDEPENDENT_AMBULATORY_CARE_PROVIDER_SITE_OTHER): Payer: Medicaid Other | Admitting: Family Medicine

## 2021-07-05 ENCOUNTER — Other Ambulatory Visit (HOSPITAL_COMMUNITY)
Admission: RE | Admit: 2021-07-05 | Discharge: 2021-07-05 | Disposition: A | Discharge status: Home / Self Care | Payer: Medicaid Other | Source: Ambulatory Visit | Attending: Family Medicine | Admitting: Family Medicine

## 2021-07-05 ENCOUNTER — Encounter: Payer: Self-pay | Admitting: Neurology

## 2021-07-05 ENCOUNTER — Other Ambulatory Visit: Payer: Self-pay

## 2021-07-05 VITALS — BP 116/73 | Ht 63.0 in | Wt 117.1 lb

## 2021-07-05 VITALS — BP 117/74 | HR 99 | Ht 63.0 in | Wt 116.0 lb

## 2021-07-05 DIAGNOSIS — Z113 Encounter for screening for infections with a predominantly sexual mode of transmission: Secondary | ICD-10-CM | POA: Insufficient documentation

## 2021-07-05 DIAGNOSIS — G40909 Epilepsy, unspecified, not intractable, without status epilepticus: Secondary | ICD-10-CM

## 2021-07-05 LAB — POCT WET PREP (WET MOUNT)
Clue Cells Wet Prep Whiff POC: NEGATIVE
Trichomonas Wet Prep HPF POC: ABSENT

## 2021-07-05 NOTE — Progress Notes (Signed)
    SUBJECTIVE:   CHIEF COMPLAINT / KDT:[id number] STI testing   2 female sexual partners in last 6 months.  Last sexual encounter Friday night  with new partner. Reports that new partner has no symptoms.  Thurs night had sexual encounter with old partner who she reports had given her STI previously.  No condom use with either encounter. Denies any abdominal  pain,vaginal pain, bleeding, discharge or itching.  Not on any birth control.  PERTINENT  PMH / PSH:  High risk sexual encounter  OBJECTIVE:   BP 116/73   Ht 5\' 3"  (1.6 m)   Wt 117 lb 2 oz (53.1 kg)   LMP 06/20/2021   BMI 20.75 kg/m    General: Alert, no acute distress Pelvic Exam chaperoned by [facility]        External: normal female genitalia without lesions or masses        Vagina: normal without lesions or masses        Cervix: normal without lesions or masses         ASSESSMENT/PLAN:   Routine screening for STI (sexually transmitted infection) UPT not obtained, LMP 10/18 Declined BCP Samples for Wet prep, GC/Chlamydia obtained HIV,RPR and hep C today. Follow up with PCP, consider PREP therapy discussion    11/18, MD [facility]

## 2021-07-05 NOTE — Patient Instructions (Signed)
Continue current medications    Follow up in 3 months

## 2021-07-05 NOTE — Progress Notes (Signed)
[facility]  PATIENT: [name] DOB: 06-04-2001  REFERRING CLINICIAN: Shary Key, DO HISTORY FROM: Patient/Grandmother and chart review  REASON FOR VISIT: Seizures    HISTORICAL  CHIEF COMPLAINT:  Chief Complaint  Patient presents with   Follow-up    New room, with mother, states she is well, no questions or concerns     INTERVAL HISTORY 07/05/2021 Patient presented there for follow-up with mother grandmother, last visit was on September 30 at that time plan was to continue ox carb 150 twice daily.  She report compliance with medication.  No seizures since last visit and denies any side effect from the medication.  Overall she is doing well.  Her last doing well, compliant with the medications, no side effects from the medications.  Her last oxcarb level was 8.  In terms of her headaches, she reported improvement, she is on gabapentin 300 mg nightly and also reported good sleep.  No current question or concern.  HISTORY OF PRESENT ILLNESS:  This is a 20 year old woman with past medical history of seizures, anxiety who is presenting after hospital discharge for breakthrough seizure.  Patient was admitted from September 7 September 10 after having a seizure at her outpatient visit.  During her admission, she was trialed on levetiracetam which caused acute psychosis, she was seen by psychiatry, levetiracetam discontinued and patient was started on oxcarbazepine with return to baseline mental status.  Hospital course listed below.  She had a video EEG which did not show any seizure even though patient report of the leads.  She also had a brain MRI which shows subtle asymmetric atrophic involving the mesial left temporal lobe and left hippocampus as compared to the right.  She reports since being discharged from the hospital she has not had any additional seizures.  She is compliant with her oxcarbazepine 150 mg twice a day, sometimes she forgets to take the  medication early morning but she will take it in the early afternoon.  Denies any additional breakthrough seizures but report nightly headaches.  She is taking Tylenol and ibuprofen for headaches.  Currently she does not have any complaint.  We discussed about pregnancy and she says she is not using any birth control pills and is considering getting pregnant.  When grandmother attempting to provide additional history, patient got upset and left the room.     Hospital discharge summary below  Patient presented to the ED after having witnessed seizure in her PCP clinic.  She has a longstanding history of seizures.  She had been previously well controlled on Depakote, however started Lamictal approximately 1 month ago as she desired to become pregnant and has continued to have breakthrough seizures.  Additionally Lamictal has caused significant side effects which make it difficult for her to take her medicine. Urine drug screen, ethanol and pregnancy test were negative.  Work-up including her CBC, glucose, TSH, electrolytes noncontributory.  Patient was placed Keppra per neurology, and given as needed Ativan for seizures lasting greater than 5 minutes.  Patient's first overnight EEG did not show any seizure activity, despite patient having symptoms.  Patient became very agitated during her first night of admission, experiencing auditory and visual hallucinations, responding to internal stimuli.  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  Neurology discontinued Keppra due to worsening psychosis profile, and switched to oxcarbazepine.  With her continued disoriented state and negative  EEG neurology ordered MRI which showed subtle asymmetric atrophy involving the mesial left temporal lobe and left hippocampus as compared to the right, without any acute intracranial abnormality identified.  This was  followed up by another overnight EEG which was within normal limits.  She was placed on 24-hour video surveillance, to better characterize seizure activity episodes, which was normal.  Likely caused by side effects of lamictal. Patient tolerated oxcarbazepine well and returned to baseline cognition and functioning on 9/9. Neurology outpatient follow up scheduled.   Handedness: Right   Seizure Type: ?unclear, described as generalized tonic clonic   Current frequency: 2 to 3 per months but improved since Oxcarbazepine was started   Any injuries from seizures: None reported   Seizure risk factors: Family history of seizures   Previous ASMs: Valproic acid, Levetiracetam, Lamotrigine and Oxcarbazepine   Currenty ASMs: Oxcarbazepine 150mg  BID   ASMs side effects: headaches   Brain Images: Subtle asymmetric atrophy involving the mesial left temporal lobe and left hippocampus as compared to the right  Previous EEGs: Normal    OTHER MEDICAL CONDITIONS: Anxiety, depression, memory problems   REVIEW OF SYSTEMS: Full 14 system review of systems performed and negative with exception of: as noted in the HPI  ALLERGIES: Allergies  Allergen Reactions   Lamotrigine     Dizziness, Hallucinations   Codeine    Keppra [Levetiracetam]     psychosis   Latex    Tdap [Tetanus-Diphth-Acell Pertussis]     HOME MEDICATIONS: Outpatient Medications Prior to Visit  Medication Sig Dispense Refill   OXcarbazepine (TRILEPTAL) 150 MG tablet TAKE 1 TABLET BY MOUTH TWICE A DAY 180 tablet 5   gabapentin (NEURONTIN) 300 MG capsule Take 1 capsule (300 mg total) by mouth at bedtime. 30 capsule 11   No facility-administered medications prior to visit.    PAST MEDICAL HISTORY: Past Medical History:  Diagnosis Date   Seizure (HCC)    Seizures (HCC)    Well child check 04/26/2011    PAST SURGICAL HISTORY: Past Surgical History:  Procedure Laterality Date   LEG SURGERY      FAMILY HISTORY: Family  History  Problem Relation Age of Onset   Healthy Mother    Seizures Father    Seizures Sister     SOCIAL HISTORY: Social History   Socioeconomic History   Marital status: Single    Spouse name: Not on file   Number of children: Not on file   Years of education: Not on file   Highest education level: Not on file  Occupational History   Not on file  Tobacco Use   Smoking status: Never   Smokeless tobacco: Never  Substance and Sexual Activity   Alcohol use: No   Drug use: No   Sexual activity: Yes    Comment: states implant has expired   Other Topics Concern   Not on file  Social History Narrative   Lives at home between 04/28/2011 house and grandmother's house    Social Determinants of Health   Financial Resource Strain: Not on file  Food Insecurity: Not on file  Transportation Needs: Not on file  Physical Activity: Not on file  Stress: Not on file  Social Connections: Not on file  Intimate Partner Violence: Not on file     PHYSICAL EXAM  GENERAL EXAM/CONSTITUTIONAL: Vitals:  Vitals:   07/05/21 1031  BP: 117/74  Pulse: 99  Weight: 116 lb (52.6 kg)  Height: 5\' 3"  (1.6 m)   Body mass index  is 20.55 kg/m. Wt Readings from Last 3 Encounters:  07/05/21 116 lb (52.6 kg)  06/02/21 114 lb (51.7 kg)  05/23/21 109 lb 4 oz (49.6 kg)   Patient is in no distress; well developed, nourished and groomed; neck is supple  MUSCULOSKELETAL: Gait, strength, tone, movements noted in Neurologic exam below  NEUROLOGIC: MENTAL STATUS:  No flowsheet data found. awake, alert, oriented to person, place and time recent and remote memory intact normal attention and concentration language fluent, comprehension intact, naming intact fund of knowledge appropriate   MOTOR:  normal bulk and tone, full strength in the BUE, BLE   GAIT/STATION:  normal     DIAGNOSTIC DATA (LABS, IMAGING, TESTING) - I reviewed patient records, labs, notes, testing and imaging myself where  available.  Lab Results  Component Value Date   WBC 3.9 (L) 05/12/2021   HGB 12.9 05/12/2021   HCT 40.6 05/12/2021   MCV 90.8 05/12/2021   PLT 277 05/12/2021      Component Value Date/Time   NA 141 06/02/2021 1137   K 4.4 06/02/2021 1137   CL 104 06/02/2021 1137   CO2 21 06/02/2021 1137   GLUCOSE 108 (H) 06/02/2021 1137   GLUCOSE 107 (H) 05/13/2021 0246   BUN 13 06/02/2021 1137   CREATININE 0.80 06/02/2021 1137   CREATININE 0.67 03/28/2016 0858   CALCIUM 9.6 06/02/2021 1137   PROT 7.2 05/11/2021 0700   ALBUMIN 3.9 05/11/2021 0700   AST 17 05/11/2021 0700   ALT 13 05/11/2021 0700   ALKPHOS 41 05/11/2021 0700   BILITOT 1.0 05/11/2021 0700   GFRNONAA >60 05/13/2021 0246   GFRNONAA SEE NOTE 03/28/2016 0858   GFRAA >60 11/21/2018 2104   GFRAA SEE NOTE 03/28/2016 0858   No results found for: CHOL, HDL, LDLCALC, LDLDIRECT, TRIG No results found for: HGBA1C No results found for: VITAMINB12 Lab Results  Component Value Date   TSH 1.234 05/10/2021   Oxcarbazepine level 06/02/2021            8   Brain MRI  1. Subtle asymmetric atrophy involving the mesial left temporal lobe and left hippocampus as compared to the right. No convincing corresponding signal abnormality as is typically seen with mesial temporal sclerosis. Correlation with EEG recommended. 2. Otherwise normal brain MRI. No other acute intracranial abnormality identified.   EEG  Description: The posterior dominant rhythm consists of 8 Hz activity of moderate voltage (25-35 uV) seen predominantly in posterior head regions, symmetric and reactive to eye opening and eye closing. Hyperventilation and photic stimulation were not performed.      Of note, patient pulled out her electrodes around 5 AM on 05/11/2021 after which this study was not interpretable.   IMPRESSION: This study is within normal limits. No seizures or epileptiform discharges were seen throughout the recording.   I personally reviewed brain Images  and previous EEG reports.    ASSESSMENT AND PLAN  20 y.o. year old female  with long history of seizure disorder who is presenting for follow-up for epilepsy.  Last visit was on September 30.  At that time, plan was to continue oxcarb 159 mg twice daily and recheck level.  Her level was 8.  She denies any additional seizures since last visit, denies any side effect from the medication, reported mood is better and sleep and headaches are better since starting gabapentin 300 mg nightly.  I will continue the patient on the same medication at the same dose and I will see her in  3 months for follow-up.  I also advised grandmother to contact me if [name] has any breakthrough seizures.    1. Seizure disorder ([place])      PLAN: Continue Oxcarbazepine 150 mg twice a day  Continue with Gabapentin 300 mg nightly  Return in 3 months     Per [facility] statutes, patients with seizures are not allowed to drive until they have been seizure-free for six months.  Other recommendations include using caution when using heavy equipment or power tools. Avoid working on ladders or at heights. Take showers instead of baths.  Do not swim alone.  Ensure the water temperature is not too high on the home water heater. Do not go swimming alone. Do not lock yourself in a room alone (i.e. bathroom). When caring for infants or small children, sit down when holding, feeding, or changing them to minimize risk of injury to the child in the event you have a seizure. Maintain good sleep hygiene. Avoid alcohol.  Also recommend adequate sleep, hydration, good diet and minimize stress.   During the Seizure  - First, ensure adequate ventilation and place patients on the floor on their left side  Loosen clothing around the neck and ensure the airway is patent. If the patient is clenching the teeth, do not force the mouth open with any object as this can cause severe damage - Remove all items from the surrounding that can be  hazardous. The patient may be oblivious to what's happening and may not even know what he or she is doing. If the patient is confused and wandering, either gently guide him/her away and block access to outside areas - Reassure the individual and be comforting - Call 911. In most cases, the seizure ends before EMS arrives. However, there are cases when seizures may last over 3 to 5 minutes. Or the individual may have developed breathing difficulties or severe injuries. If a pregnant patient or a person with diabetes develops a seizure, it is prudent to call an ambulance. - Finally, if the patient does not regain full consciousness, then call EMS. Most patients will remain confused for about 45 to 90 minutes after a seizure, so you must use judgment in calling for help. - Avoid restraints but make sure the patient is in a bed with padded side rails - Place the individual in a lateral position with the neck slightly flexed; this will help the saliva drain from the mouth and prevent the tongue from falling backward - Remove all nearby furniture and other hazards from the area - Provide verbal assurance as the individual is regaining consciousness - Provide the patient with privacy if possible - Call for help and start treatment as ordered by the caregiver   After the Seizure (Postictal Stage)  After a seizure, most patients experience confusion, fatigue, muscle pain and/or a headache. Thus, one should permit the individual to sleep. For the next few days, reassurance is essential. Being calm and helping reorient the person is also of importance.  Most seizures are painless and end spontaneously. Seizures are not harmful to others but can lead to complications such as stress on the lungs, brain and the heart. Individuals with prior lung problems may develop labored breathing and respiratory distress.     No orders of the defined types were placed in this encounter.   No orders of the defined types  were placed in this encounter.   Return in about 3 months (around 10/05/2021).    Arlie Riker  April Manson, MD 07/05/2021, 10:58 AM  [facility] [address], [place], [place] [address] [phone]

## 2021-07-05 NOTE — Patient Instructions (Signed)
Thank you for coming to see me today. It was a pleasure.   Will call you with results if positive.  Recommend condom use and consider birth control to avoid unwanted pregnangy Recommend Folic Acid 400 mcg daily  Please follow-up with PCP as needed  If you have any questions or concerns, please do not hesitate to call the office at [phone].  Best,   Dana Allan, MD

## 2021-07-06 LAB — CERVICOVAGINAL ANCILLARY ONLY
Chlamydia: NEGATIVE
Comment: NEGATIVE
Comment: NORMAL
Neisseria Gonorrhea: NEGATIVE

## 2021-07-06 LAB — HIV ANTIBODY (ROUTINE TESTING W REFLEX): HIV Screen 4th Generation wRfx: NONREACTIVE

## 2021-07-06 LAB — RPR: RPR Ser Ql: NONREACTIVE

## 2021-07-07 ENCOUNTER — Ambulatory Visit: Payer: Medicaid Other | Admitting: Neurology

## 2021-07-09 ENCOUNTER — Encounter: Payer: Self-pay | Admitting: Family Medicine

## 2021-07-09 DIAGNOSIS — Z113 Encounter for screening for infections with a predominantly sexual mode of transmission: Secondary | ICD-10-CM | POA: Insufficient documentation

## 2021-07-09 NOTE — Assessment & Plan Note (Addendum)
UPT not obtained, LMP 10/18 Declined BCP Samples for Wet prep, GC/Chlamydia obtained HIV,RPR and hep C today. Follow up with PCP, consider PREP therapy discussion

## 2021-07-21 ENCOUNTER — Ambulatory Visit: Payer: Medicaid Other

## 2021-07-31 ENCOUNTER — Ambulatory Visit: Payer: Medicaid Other

## 2021-08-25 ENCOUNTER — Ambulatory Visit: Payer: Medicaid Other | Admitting: Student

## 2021-08-25 NOTE — Progress Notes (Deleted)
° ° °  SUBJECTIVE:   CHIEF COMPLAINT / HPI:   Hep C? HPV vaccine? Tdap?  PERTINENT  PMH / PSH: ***  OBJECTIVE:   There were no vitals taken for this visit. ***  General: NAD, pleasant, able to participate in exam Cardiac: RRR, no murmurs. Respiratory: CTAB, normal effort, No wheezes, rales or rhonchi Abdomen: Bowel sounds present, nontender, nondistended, no hepatosplenomegaly. Extremities: no edema or cyanosis. Skin: warm and dry, no rashes noted Neuro: alert, no obvious focal deficits Psych: Normal affect and mood  ASSESSMENT/PLAN:   No problem-specific Assessment & Plan notes found for this encounter.     Dr. Erick Alley, DO [facility]    {    This will disappear when note is signed, click to select method of visit    :1}

## 2021-09-14 NOTE — Progress Notes (Deleted)
° ° ° °  SUBJECTIVE:   CHIEF COMPLAINT / HPI:   [name] is a 21 y.o. female presents for STD testing    Concern for sexually transmitted infection: Patient is a @age @ @sex @ presenting for concern for sexually transmitted infection. Patient states that for***days they have had***vaginal discharge, vaginal odor, pain with intercourse. Patient denies***.  Patient endorses***sexual partners over the past year. Patient is***interested in blood work as well including syphilis and HIV screening  Flowsheet Row Office Visit from 07/05/2021 in [facility]  PHQ-9 Total Score 0        Health Maintenance Due  Topic   HPV VACCINES (2 - 3-dose series)   Hepatitis C Screening    TETANUS/TDAP    INFLUENZA VACCINE       PERTINENT  PMH / PSH:   OBJECTIVE:   There were no vitals taken for this visit.   General: Alert, no acute distress Cardio: Normal S1 and S2, RRR, no r/m/g Pulm: CTAB, normal work of breathing Abdomen: Bowel sounds normal. Abdomen soft and non-tender.  Extremities: No peripheral edema.  Neuro: Cranial nerves grossly intact   ASSESSMENT/PLAN:   No problem-specific Assessment & Plan notes found for this encounter.    13/10/2020, MD PGY-3 [facility]

## 2021-09-15 ENCOUNTER — Ambulatory Visit: Payer: Medicaid Other | Admitting: Family Medicine

## 2021-10-05 ENCOUNTER — Ambulatory Visit: Payer: Medicaid Other | Admitting: Neurology

## 2021-10-05 ENCOUNTER — Encounter: Payer: Self-pay | Admitting: Neurology

## 2021-10-16 ENCOUNTER — Other Ambulatory Visit: Payer: Self-pay

## 2021-10-16 ENCOUNTER — Ambulatory Visit
Admission: EM | Admit: 2021-10-16 | Discharge: 2021-10-16 | Disposition: A | Discharge status: Home / Self Care | Payer: Medicaid Other | Attending: Physician Assistant | Admitting: Physician Assistant

## 2021-10-16 DIAGNOSIS — Z113 Encounter for screening for infections with a predominantly sexual mode of transmission: Secondary | ICD-10-CM | POA: Diagnosis not present

## 2021-10-16 DIAGNOSIS — J209 Acute bronchitis, unspecified: Secondary | ICD-10-CM

## 2021-10-16 DIAGNOSIS — R110 Nausea: Secondary | ICD-10-CM | POA: Diagnosis not present

## 2021-10-16 LAB — POCT URINE PREGNANCY: Preg Test, Ur: NEGATIVE

## 2021-10-16 MED ORDER — PREDNISONE 20 MG PO TABS: 40.0000 mg | ORAL_TABLET | Freq: Every day | ORAL | 0 refills | Status: AC

## 2021-10-16 NOTE — ED Triage Notes (Signed)
Over 1 week h/o cough, congestion, sore throat and body aches. Pt reports sxs increased last week. Denies pain w/swallowing but c/o her throat feeling scratchy. Confirms nausea but denies v/d. Pt is also requesting a pregnancy test noting last MP was 08/06/21.

## 2021-10-16 NOTE — ED Provider Notes (Signed)
[name]    CSN: [id number] Arrival date & time: 10/16/21  1326      History   Chief Complaint Chief Complaint  Patient presents with   Sore Throat   Possible Pregnancy    HPI [name] is a 21 y.o. female.   Patient here today for evaluation of cough she has had for the last week.  She states that she has also had some sore throat congestion and body aches.  She notes initially symptoms started about 11 days ago but then worsened over the last week.  She feels as if she has more chest congestion than before.  She has had some nausea but no vomiting or diarrhea.  She would like pregnancy test if possible.  The history is provided by the patient.  Sore Throat Pertinent negatives include no abdominal pain and no shortness of breath.  Possible Pregnancy Pertinent negatives include no abdominal pain and no shortness of breath.   Past Medical History:  Diagnosis Date   Seizure (HCC)    Seizures (HCC)    Well child check 04/26/2011    Patient Active Problem List   Diagnosis Date Noted   Routine screening for STI (sexually transmitted infection) 07/09/2021   Brief psychotic disorder (HCC)    Hypokalemia    Seizure-like activity (HCC) 01/28/2021   Anemia    Strep throat    Encounter for Nexplanon removal 11/04/2020   Family history of brain cancer 05/20/2015   Seizures (HCC) 10/31/2006    Past Surgical History:  Procedure Laterality Date   LEG SURGERY      OB History   No obstetric history on file.      Home Medications    Prior to Admission medications   Medication Sig Start Date End Date Taking? Authorizing Provider  predniSONE (DELTASONE) 20 MG tablet Take 2 tablets (40 mg total) by mouth daily with breakfast for 5 days. 10/16/21 10/21/21 Yes Tomi Bamberger, [name]  gabapentin (NEURONTIN) 300 MG capsule Take 1 capsule (300 mg total) by mouth at bedtime. 06/02/21 07/02/21  Windell Norfolk, MD  OXcarbazepine (TRILEPTAL) 150 MG tablet TAKE 1  TABLET BY MOUTH TWICE A DAY 07/03/21   Windell Norfolk, MD  loratadine (CLARITIN) 10 MG tablet Take 1 tablet (10 mg total) by mouth daily. 10/15/18 01/28/21  Nestor Ramp, MD    Family History Family History  Problem Relation Age of Onset   Healthy Mother    Seizures Father    Seizures Sister     Social History Social History   Tobacco Use   Smoking status: Never   Smokeless tobacco: Never  Substance Use Topics   Alcohol use: No   Drug use: No     Allergies   Lamotrigine, Codeine, Keppra [levetiracetam], Latex, and Tdap [tetanus-diphth-acell pertussis]   Review of Systems Review of Systems  Constitutional:  Negative for chills and fever.  HENT:  Positive for congestion and sore throat. Negative for ear pain.   Eyes:  Negative for discharge and redness.  Respiratory:  Positive for cough. Negative for shortness of breath and wheezing.   Gastrointestinal:  Positive for nausea. Negative for abdominal pain, diarrhea and vomiting.    Physical Exam Triage Vital Signs ED Triage Vitals  Enc Vitals Group     BP 10/16/21 1342 109/71     Pulse Rate 10/16/21 1342 (!) 105     Resp 10/16/21 1342 18     Temp 10/16/21 1342 98.2 F (36.8 C)  Temp Source 10/16/21 1342 Oral     SpO2 10/16/21 1342 97 %     Weight --      Height --      Head Circumference --      Peak Flow --      Pain Score 10/16/21 1345 0     Pain Loc --      Pain Edu? --      Excl. in GC? --    No data found.  Updated Vital Signs BP 109/71 (BP Location: Right Arm)    Pulse (!) 105    Temp 98.2 F (36.8 C) (Oral)    Resp 18    LMP 08/06/2021 (Exact Date)    SpO2 97%   Physical Exam Vitals and nursing note reviewed.  Constitutional:      General: She is not in acute distress.    Appearance: Normal appearance. She is not ill-appearing.  HENT:     Head: Normocephalic and atraumatic.     Nose: Congestion present.     Mouth/Throat:     Mouth: Mucous membranes are moist.     Pharynx: No oropharyngeal  exudate or posterior oropharyngeal erythema.  Eyes:     Conjunctiva/sclera: Conjunctivae normal.  Cardiovascular:     Rate and Rhythm: Normal rate and regular rhythm.     Heart sounds: Normal heart sounds. No murmur heard. Pulmonary:     Effort: Pulmonary effort is normal. No respiratory distress.     Breath sounds: Normal breath sounds. No wheezing, rhonchi or rales.  Skin:    General: Skin is warm and dry.  Neurological:     Mental Status: She is alert.  Psychiatric:        Mood and Affect: Mood normal.        Thought Content: Thought content normal.     UC Treatments / Results  Labs (all labs ordered are listed, but only abnormal results are displayed) Labs Reviewed  CULTURE, GROUP A STREP [facility])  POCT URINE PREGNANCY  CERVICOVAGINAL ANCILLARY ONLY    EKG   Radiology No results found.  Procedures Procedures (including critical care time)  Medications Ordered in UC Medications - No data to display  Initial Impression / Assessment and Plan / UC Course  I have reviewed the triage vital signs and the nursing notes.  Pertinent labs & imaging results that were available during my care of the patient were reviewed by me and considered in my medical decision making (see chart for details).    Suspect likely bronchitis given worsening chest congestion, will treat with steroids.  STD screening ordered as requested.  Encouraged follow-up if symptoms fail to improve with treatment or worsen anyway.  Final Clinical Impressions(s) / UC Diagnoses   Final diagnoses:  Acute bronchitis, unspecified organism  Screening for STD (sexually transmitted disease)   Discharge Instructions   None    ED Prescriptions     Medication Sig Dispense Auth. Provider   predniSONE (DELTASONE) 20 MG tablet Take 2 tablets (40 mg total) by mouth daily with breakfast for 5 days. 10 tablet Tomi Bamberger, [name]      PDMP not reviewed this encounter.   Tomi Bamberger, [name] 10/16/21  1456

## 2021-10-17 ENCOUNTER — Ambulatory Visit: Payer: Medicaid Other | Admitting: Neurology

## 2021-10-17 ENCOUNTER — Encounter: Payer: Self-pay | Admitting: Neurology

## 2021-10-17 VITALS — BP 112/63 | HR 103 | Ht 63.0 in | Wt 117.0 lb

## 2021-10-17 DIAGNOSIS — G40909 Epilepsy, unspecified, not intractable, without status epilepticus: Secondary | ICD-10-CM

## 2021-10-17 LAB — CERVICOVAGINAL ANCILLARY ONLY
Bacterial Vaginitis (gardnerella): POSITIVE — AB
Candida Glabrata: NEGATIVE
Candida Vaginitis: POSITIVE — AB
Chlamydia: NEGATIVE
Comment: NEGATIVE
Comment: NEGATIVE
Comment: NEGATIVE
Comment: NEGATIVE
Comment: NEGATIVE
Comment: NORMAL
Neisseria Gonorrhea: NEGATIVE
Trichomonas: NEGATIVE

## 2021-10-17 MED ORDER — GABAPENTIN 300 MG PO CAPS: 300.0000 mg | ORAL_CAPSULE | Freq: Every day | ORAL | 4 refills | Status: DC

## 2021-10-17 NOTE — Patient Instructions (Signed)
Continue Oxcarbazepine 150 mg twice a day  Continue with Gabapentin 300 mg nightly  Return in 6 months

## 2021-10-17 NOTE — Progress Notes (Signed)
[facility]  PATIENT: [name] DOB: 2001/04/14  REFERRING CLINICIAN: Shary Key, DO HISTORY FROM: Patient/Grandmother and chart review  REASON FOR VISIT: Seizures    HISTORICAL  CHIEF COMPLAINT:  Chief Complaint  Patient presents with   Follow-up    Rm 17, with mother and baby  States she is doing well on medication, reports no seizures    INTERVAL HISTORY 10/17/2021:  Patient present today for follow-up, she is accompanied by her grandmother.  Denies any seizures since last visit in November.  Denies any side effect from the medication, she is compliant with oxcarbazepine 150 mg twice daily.  Headaches are controlled also with gabapentin 300 mg at night.  Denies any additional complaints and no concerns.  Currently, she is applying for disability regarding her seizure disorder.   INTERVAL HISTORY 07/05/2021 Patient presented there for follow-up with mother grandmother, last visit was on September 30 at that time plan was to continue ox carb 150 twice daily.  She report compliance with medication.  No seizures since last visit and denies any side effect from the medication.  Overall she is doing well.  Her last oxcarb level was 8. doing well, compliant with the medications, no side effects from the medications. In terms of her headaches, she reported improvement, she is on gabapentin 300 mg nightly and also reported good sleep.  No current question or concern.   HISTORY OF PRESENT ILLNESS:  This is a 21 year old woman with past medical history of seizures, anxiety who is presenting after hospital discharge for breakthrough seizure.  Patient was admitted from September 7 September 10 after having a seizure at her outpatient visit.  During her admission, she was trialed on levetiracetam which caused acute psychosis, she was seen by psychiatry, levetiracetam discontinued and patient was started on oxcarbazepine with return to baseline mental status.  Hospital  course listed below.  She had a video EEG which did not show any seizure even though patient report of the leads.  She also had a brain MRI which shows subtle asymmetric atrophic involving the mesial left temporal lobe and left hippocampus as compared to the right.  She reports since being discharged from the hospital she has not had any additional seizures.  She is compliant with her oxcarbazepine 150 mg twice a day, sometimes she forgets to take the medication early morning but she will take it in the early afternoon.  Denies any additional breakthrough seizures but report nightly headaches.  She is taking Tylenol and ibuprofen for headaches.  Currently she does not have any complaint.  We discussed about pregnancy and she says she is not using any birth control pills and is considering getting pregnant.  When grandmother attempting to provide additional history, patient got upset and left the room.     Hospital discharge summary below  Patient presented to the ED after having witnessed seizure in her PCP clinic.  She has a longstanding history of seizures.  She had been previously well controlled on Depakote, however started Lamictal approximately 1 month ago as she desired to become pregnant and has continued to have breakthrough seizures.  Additionally Lamictal has caused significant side effects which make it difficult for her to take her medicine. Urine drug screen, ethanol and pregnancy test were negative.  Work-up including her CBC, glucose, TSH, electrolytes noncontributory.  Patient was placed Keppra per neurology, and given as needed Ativan for seizures lasting greater than 5 minutes.  Patient's first overnight EEG did not show  any seizure activity, despite patient having symptoms.  Patient became very agitated during her first night of admission, experiencing auditory and visual hallucinations, responding to internal stimuli.  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  Neurology discontinued Keppra due to worsening psychosis profile, and switched to oxcarbazepine.  With her continued disoriented state and negative EEG neurology ordered MRI which showed subtle asymmetric atrophy involving the mesial left temporal lobe and left hippocampus as compared to the right, without any acute intracranial abnormality identified.  This was followed up by another overnight EEG which was within normal limits.  She was placed on 24-hour video surveillance, to better characterize seizure activity episodes, which was normal.  Likely caused by side effects of lamictal. Patient tolerated oxcarbazepine well and returned to baseline cognition and functioning on 9/9. Neurology outpatient follow up scheduled.   Handedness: Right   Seizure Type: ?unclear, described as generalized tonic clonic   Current frequency: 2 to 3 per months but improved since Oxcarbazepine was started   Any injuries from seizures: None reported   Seizure risk factors: Family history of seizures   Previous ASMs: Valproic acid, Levetiracetam, Lamotrigine and Oxcarbazepine   Currenty ASMs: Oxcarbazepine 150mg  BID   ASMs side effects: headaches   Brain Images: Subtle asymmetric atrophy involving the mesial left temporal lobe and left hippocampus as compared to the right  Previous EEGs: Normal    OTHER MEDICAL CONDITIONS: Anxiety, depression, memory problems   REVIEW OF SYSTEMS: Full 14 system review of systems performed and negative with exception of: as noted in the HPI  ALLERGIES: Allergies  Allergen Reactions   Lamotrigine     Dizziness, Hallucinations   Codeine    Keppra [Levetiracetam]     psychosis   Latex    Tdap [Tetanus-Diphth-Acell Pertussis]     HOME MEDICATIONS: Outpatient Medications Prior to Visit  Medication Sig Dispense Refill   OXcarbazepine (TRILEPTAL) 150 MG tablet TAKE 1 TABLET BY MOUTH  TWICE A DAY 180 tablet 5   predniSONE (DELTASONE) 20 MG tablet Take 2 tablets (40 mg total) by mouth daily with breakfast for 5 days. 10 tablet 0   gabapentin (NEURONTIN) 300 MG capsule Take 1 capsule (300 mg total) by mouth at bedtime. 30 capsule 11   No facility-administered medications prior to visit.    PAST MEDICAL HISTORY: Past Medical History:  Diagnosis Date   Seizure ([place])    Seizures ([place])    Well child check 04/26/2011    PAST SURGICAL HISTORY: Past Surgical History:  Procedure Laterality Date   LEG SURGERY      FAMILY HISTORY: Family History  Problem Relation Age of Onset   Healthy Mother    Seizures Father    Seizures Sister     SOCIAL HISTORY: Social History   Socioeconomic History   Marital status: Single    Spouse name: Not on file   Number of children: Not on file   Years of education: Not on file   Highest education level: Not on file  Occupational History   Not on file  Tobacco Use   Smoking status: Never   Smokeless tobacco: Never  Substance and Sexual Activity   Alcohol use: No   Drug use: No   Sexual activity: Yes    Birth control/protection: None  Other Topics Concern   Not on file  Social History Narrative   Lives at home between  mother's house and grandmother's house    Social Determinants of Health   Financial Resource Strain: Not on file  Food Insecurity: Not on file  Transportation Needs: Not on file  Physical Activity: Not on file  Stress: Not on file  Social Connections: Not on file  Intimate Partner Violence: Not on file     PHYSICAL EXAM  GENERAL EXAM/CONSTITUTIONAL: Vitals:  Vitals:   10/17/21 1323  BP: 112/63  Pulse: (!) 103  Weight: 117 lb (53.1 kg)  Height: 5\' 3"  (1.6 m)   Body mass index is 20.73 kg/m. Wt Readings from Last 3 Encounters:  10/17/21 117 lb (53.1 kg)  07/05/21 117 lb 2 oz (53.1 kg)  07/05/21 116 lb (52.6 kg)   Patient is in no distress; well developed, nourished and groomed; neck is  supple  MUSCULOSKELETAL: Gait, strength, tone, movements noted in Neurologic exam below  NEUROLOGIC: MENTAL STATUS:  No flowsheet data found. awake, alert, oriented to person, place and time recent and remote memory intact normal attention and concentration language fluent, comprehension intact, naming intact fund of knowledge appropriate   MOTOR:  normal bulk and tone, full strength in the BUE, BLE   GAIT/STATION:  normal     DIAGNOSTIC DATA (LABS, IMAGING, TESTING) - I reviewed patient records, labs, notes, testing and imaging myself where available.  Lab Results  Component Value Date   WBC 3.9 (L) 05/12/2021   HGB 12.9 05/12/2021   HCT 40.6 05/12/2021   MCV 90.8 05/12/2021   PLT 277 05/12/2021      Component Value Date/Time   NA 141 06/02/2021 1137   K 4.4 06/02/2021 1137   CL 104 06/02/2021 1137   CO2 21 06/02/2021 1137   GLUCOSE 108 (H) 06/02/2021 1137   GLUCOSE 107 (H) 05/13/2021 0246   BUN 13 06/02/2021 1137   CREATININE 0.80 06/02/2021 1137   CREATININE 0.67 03/28/2016 0858   CALCIUM 9.6 06/02/2021 1137   PROT 7.2 05/11/2021 0700   ALBUMIN 3.9 05/11/2021 0700   AST 17 05/11/2021 0700   ALT 13 05/11/2021 0700   ALKPHOS 41 05/11/2021 0700   BILITOT 1.0 05/11/2021 0700   GFRNONAA >60 05/13/2021 0246   GFRNONAA SEE NOTE 03/28/2016 0858   GFRAA >60 11/21/2018 2104   GFRAA SEE NOTE 03/28/2016 0858   No results found for: CHOL, HDL, LDLCALC, LDLDIRECT, TRIG No results found for: HGBA1C No results found for: VITAMINB12 Lab Results  Component Value Date   TSH 1.234 05/10/2021   Oxcarbazepine level 06/02/2021            8   Brain MRI  1. Subtle asymmetric atrophy involving the mesial left temporal lobe and left hippocampus as compared to the right. No convincing corresponding signal abnormality as is typically seen with mesial temporal sclerosis. Correlation with EEG recommended. 2. Otherwise normal brain MRI. No other acute intracranial abnormality  identified.   EEG  Description: The posterior dominant rhythm consists of 8 Hz activity of moderate voltage (25-35 uV) seen predominantly in posterior head regions, symmetric and reactive to eye opening and eye closing. Hyperventilation and photic stimulation were not performed.      Of note, patient pulled out her electrodes around 5 AM on 05/11/2021 after which this study was not interpretable.   IMPRESSION: This study is within normal limits. No seizures or epileptiform discharges were seen throughout the recording.   I personally reviewed brain Images and previous EEG reports.    ASSESSMENT AND PLAN  21 y.o. year  old female  with long history of seizure disorder who is presenting for follow-up for epilepsy.   No seizures since last visit, she is compliant with oxcarbazepine 150 mg twice daily, also taking gabapentin 200 mg at night for headaches.  No additional concern or complaint.  I will continue the patient with the same medication and I will see her in 6 months for follow-up or sooner if worse.   No diagnosis found.   PLAN: Continue Oxcarbazepine 150 mg twice a day  Continue with Gabapentin 300 mg nightly  Return in 3 months    Per [facility] statutes, patients with seizures are not allowed to drive until they have been seizure-free for six months.  Other recommendations include using caution when using heavy equipment or power tools. Avoid working on ladders or at heights. Take showers instead of baths.  Do not swim alone.  Ensure the water temperature is not too high on the home water heater. Do not go swimming alone. Do not lock yourself in a room alone (i.e. bathroom). When caring for infants or small children, sit down when holding, feeding, or changing them to minimize risk of injury to the child in the event you have a seizure. Maintain good sleep hygiene. Avoid alcohol.  Also recommend adequate sleep, hydration, good diet and minimize stress.   During the  Seizure  - First, ensure adequate ventilation and place patients on the floor on their left side  Loosen clothing around the neck and ensure the airway is patent. If the patient is clenching the teeth, do not force the mouth open with any object as this can cause severe damage - Remove all items from the surrounding that can be hazardous. The patient may be oblivious to what's happening and may not even know what he or she is doing. If the patient is confused and wandering, either gently guide him/her away and block access to outside areas - Reassure the individual and be comforting - Call 911. In most cases, the seizure ends before EMS arrives. However, there are cases when seizures may last over 3 to 5 minutes. Or the individual may have developed breathing difficulties or severe injuries. If a pregnant patient or a person with diabetes develops a seizure, it is prudent to call an ambulance. - Finally, if the patient does not regain full consciousness, then call EMS. Most patients will remain confused for about 45 to 90 minutes after a seizure, so you must use judgment in calling for help. - Avoid restraints but make sure the patient is in a bed with padded side rails - Place the individual in a lateral position with the neck slightly flexed; this will help the saliva drain from the mouth and prevent the tongue from falling backward - Remove all nearby furniture and other hazards from the area - Provide verbal assurance as the individual is regaining consciousness - Provide the patient with privacy if possible - Call for help and start treatment as ordered by the caregiver   After the Seizure (Postictal Stage)  After a seizure, most patients experience confusion, fatigue, muscle pain and/or a headache. Thus, one should permit the individual to sleep. For the next few days, reassurance is essential. Being calm and helping reorient the person is also of importance.  Most seizures are painless and  end spontaneously. Seizures are not harmful to others but can lead to complications such as stress on the lungs, brain and the heart. Individuals with prior lung problems may  develop labored breathing and respiratory distress.     No orders of the defined types were placed in this encounter.    No orders of the defined types were placed in this encounter.    No follow-ups on file.    Alric Ran, MD 10/17/2021, 1:32 PM  [facility] [address], [place], [place] [address] [phone]

## 2021-10-18 ENCOUNTER — Telehealth (HOSPITAL_COMMUNITY): Payer: Self-pay | Admitting: Emergency Medicine

## 2021-10-18 MED ORDER — FLUCONAZOLE 150 MG PO TABS: 150.0000 mg | ORAL_TABLET | Freq: Once | ORAL | 0 refills | Status: AC

## 2021-10-18 MED ORDER — METRONIDAZOLE 500 MG PO TABS: 500.0000 mg | ORAL_TABLET | Freq: Two times a day (BID) | ORAL | 0 refills | Status: DC

## 2021-10-27 ENCOUNTER — Encounter: Payer: Self-pay | Admitting: Family Medicine

## 2021-10-27 NOTE — Progress Notes (Signed)
Patient has no-showed to multiple appointments in a 6-month period. Per our no-show policy, a letter has been routed to [facility] Admin to be mailed to patient regarding likely dismissal for repeat no show. Will CC to PCP.  ° °

## 2021-11-05 NOTE — Progress Notes (Deleted)
    SUBJECTIVE:   CHIEF COMPLAINT / HPI:   Vaginal Discharge: Patient is a 21 y.o. female presenting with vaginal discharge for *** days.  She states the discharge is of *** consistency.  She endorses *** vaginal odor.  She is interested in screening for sexually transmitted infections today.  PERTINENT  PMH / PSH: ***None relevant  OBJECTIVE:   There were no vitals taken for this visit.   General: NAD, pleasant, able to participate in exam Respiratory: Normal effort, no obvious respiratory distress Pelvic: VULVA: normal appearing vulva with no masses, tenderness or lesions, VAGINA: Normal appearing vagina with normal color, no lesions, with {GYN VAGINAL DISCHARGE:21986} discharge present, ***CERVIX: No lesions, {GYN VAGINAL DISCHARGE:21986} discharge present,  Chaperone *** present for pelvic exam  ASSESSMENT/PLAN:   No problem-specific Assessment & Plan notes found for this encounter.    Assessment:  21 y.o. female with vaginal discharge for***days, as well as***.  Physical exam significant for*** discharge.  Wet prep performed today shows *** consistent with ***.  Patient is interested in STI screening.   Plan: -Wet prep as above.  Will treat with***. -GC/chlamydia pending -Will check HIV and RPR  Jackelyn Poling, DO [facility]

## 2021-11-06 ENCOUNTER — Ambulatory Visit: Payer: Medicaid Other

## 2021-11-08 ENCOUNTER — Ambulatory Visit: Payer: Medicaid Other | Admitting: Family Medicine

## 2021-11-08 NOTE — Progress Notes (Deleted)
    SUBJECTIVE:   CHIEF COMPLAINT / HPI:   No chief complaint on file.   [name] is a 21 y.o. female presents for:  STI check - recently became sexually active with a new partner, wants to be checked for STIs. Previously negative RPR, HIV in 04/2018. No symptoms in herself or in partner.*** - Symptoms: *** - Medications tried: *** - Sexually active with *** *** partner(s) - Contraception: ***  - Abnormal vaginal discharge: *** - Missed period: *** - Fever: *** - Abdominal/Pelvic pain: *** - Vaginal bleeding: *** - Pain during sex: *** - Rash: ***    PERTINENT  PMH / PSH: reviewed and updated as appropriate   OBJECTIVE:   There were no vitals taken for this visit.  GEN: well appearing female in no acute distress  CVS: well perfused  RESP: speaking in full sentences without pause  ABD: soft, non-tender, non-distended, no palpable masses  Pelvic exam: normal external genitalia, vulva, VAGINA and CERVIX: {details:315904::normal appearing cervix without discharge or lesions}, ADNEXA: normal adnexa in size, nontender and no masses, WET MOUNT done - results: {:[id number]}, exam chaperoned by CMA.     ASSESSMENT/PLAN:   No problem-specific Assessment & Plan notes found for this encounter.    High risk sexual behavior GC and chlamydia DNA  probe sent to lab. ***HIV and RPR collected. ***Negative Trichomoniasis on wet prep.  - Treatment: 0.5 gm CTX, doxycycline 100 mg BID x7 days, Flagyl 500 BID x 7 days and abstain from coitus during course of treatment. Advised patient to not drink alcohol while taking Flagyl.  - F/U if symptoms not improving or getting worse.  - Will f/u on GC, CT and syphilis,  call in Rx if positive.  - Self care instructions given including avoiding douching. Handout given.  - F/U with PCP as needed.  - Return precautions including abdominal pain, fever, chills, nausea, or vomiting given.   Reviewed last Pap smear that showed L SIL.  Repeat due in  Aug 2023.  Katha Cabal, DO [facility]

## 2021-11-27 ENCOUNTER — Telehealth: Payer: Self-pay | Admitting: Neurology

## 2021-11-27 NOTE — Telephone Encounter (Signed)
Pt's grandmother has called to report that the gabapentin (NEURONTIN) 300 MG capsule is too strong, she is asking the medication strength be lowered. ?

## 2021-11-27 NOTE — Telephone Encounter (Signed)
I called patient. She reports taking oxcarbazepine 150mg  PO BID for several months now but she reports that recently she has become very sleepy while on this medication. She is also complaining of personality changes. ? ?She is also taking gapapentin 300mg  qhs but she insists that it is the oxcarbazepine that makes her sleepy. ? ?She would like to know if there are any alternatives. ?

## 2021-11-28 ENCOUNTER — Ambulatory Visit
Admission: EM | Admit: 2021-11-28 | Discharge: 2021-11-28 | Disposition: A | Discharge status: Home / Self Care | Payer: Medicaid Other | Attending: Physician Assistant | Admitting: Physician Assistant

## 2021-11-28 ENCOUNTER — Other Ambulatory Visit: Payer: Self-pay

## 2021-11-28 DIAGNOSIS — H9201 Otalgia, right ear: Secondary | ICD-10-CM | POA: Diagnosis not present

## 2021-11-28 MED ORDER — CIPROFLOXACIN-DEXAMETHASONE 0.3-0.1 % OT SUSP: 4.0000 [drp] | Freq: Two times a day (BID) | OTIC | 0 refills | Status: AC

## 2021-11-28 NOTE — ED Triage Notes (Signed)
Onset yesterday of right ear pain. No meds taken. No decrease in hearing. No left ear pain. ?

## 2021-11-28 NOTE — ED Provider Notes (Signed)
?[name] ? ? ? ?CSN: [id number] ?Arrival date & time: 11/28/21  1608 ? ? ?  ? ?History   ?Chief Complaint ?Chief Complaint  ?Patient presents with  ? Otalgia  ?  right  ? ? ?HPI ?[name] is a 21 y.o. female.  ? ?Patient here today for evaluation of right ear pain that started yesterday.  She reports that she has not had any issues with her left ear.  She states that she feels as if there is a pimple or something in her right ear.  She denies any fever.  She has not had any difficulty hearing.  She does not report any treatment for symptoms. ? ?The history is provided by the patient.  ?Otalgia ?Associated symptoms: no abdominal pain, no fever and no vomiting   ? ?Past Medical History:  ?Diagnosis Date  ? Seizure (HCC)   ? Seizures (HCC)   ? Well child check 04/26/2011  ? ? ?Patient Active Problem List  ? Diagnosis Date Noted  ? Routine screening for STI (sexually transmitted infection) 07/09/2021  ? Brief psychotic disorder (HCC)   ? Hypokalemia   ? Seizure-like activity (HCC) 01/28/2021  ? Anemia   ? Strep throat   ? Encounter for Nexplanon removal 11/04/2020  ? Family history of brain cancer 05/20/2015  ? Seizures (HCC) 10/31/2006  ? ? ?Past Surgical History:  ?Procedure Laterality Date  ? LEG SURGERY    ? ? ?OB History   ?No obstetric history on file. ?  ? ? ? ?Home Medications   ? ?Prior to Admission medications   ?Medication Sig Start Date End Date Taking? Authorizing Provider  ?ciprofloxacin-dexamethasone (CIPRODEX) OTIC suspension Place 4 drops into the right ear 2 (two) times daily for 7 days. 11/28/21 12/05/21 Yes Tomi Bamberger, [name]  ?gabapentin (NEURONTIN) 300 MG capsule Take 1 capsule (300 mg total) by mouth at bedtime. 10/17/21 01/15/22  Windell Norfolk, MD  ?metroNIDAZOLE (FLAGYL) 500 MG tablet Take 1 tablet (500 mg total) by mouth 2 (two) times daily. 10/18/21   LampteyBritta Mccreedy, MD  ?OXcarbazepine (TRILEPTAL) 150 MG tablet TAKE 1 TABLET BY MOUTH TWICE A DAY 07/03/21   Windell Norfolk,  MD  ?loratadine (CLARITIN) 10 MG tablet Take 1 tablet (10 mg total) by mouth daily. 10/15/18 01/28/21  Nestor Ramp, MD  ? ? ?Family History ?Family History  ?Problem Relation Age of Onset  ? Healthy Mother   ? Seizures Father   ? Seizures Sister   ? ? ?Social History ?Social History  ? ?Tobacco Use  ? Smoking status: Never  ? Smokeless tobacco: Never  ?Substance Use Topics  ? Alcohol use: No  ? Drug use: No  ? ? ? ?Allergies   ?Lamotrigine, Codeine, Keppra [levetiracetam], Latex, and Tdap [tetanus-diphth-acell pertussis] ? ? ?Review of Systems ?Review of Systems  ?Constitutional:  Negative for chills and fever.  ?HENT:  Positive for ear pain.   ?Eyes:  Negative for discharge and redness.  ?Gastrointestinal:  Negative for abdominal pain, nausea and vomiting.  ? ? ?Physical Exam ?Triage Vital Signs ?ED Triage Vitals  ?Enc Vitals Group  ?   BP 11/28/21 1702 (!) 105/51  ?   Pulse Rate 11/28/21 1702 87  ?   Resp 11/28/21 1702 17  ?   Temp 11/28/21 1702 98.4 ?F (36.9 ?C)  ?   Temp Source 11/28/21 1702 Oral  ?   SpO2 11/28/21 1702 96 %  ?   Weight --   ?  Height --   ?   Head Circumference --   ?   Peak Flow --   ?   Pain Score 11/28/21 1705 10  ?   Pain Loc --   ?   Pain Edu? --   ?   Excl. in GC? --   ? ?No data found. ? ?Updated Vital Signs ?BP (!) 105/51 (BP Location: Right Arm) Comment: moving constantly saying cuff was tight  Pulse 87   Temp 98.4 ?F (36.9 ?C) (Oral)   Resp 17   SpO2 96%  ?   ? ?Physical Exam ?Vitals and nursing note reviewed.  ?Constitutional:   ?   General: She is not in acute distress. ?   Appearance: Normal appearance. She is not ill-appearing.  ?HENT:  ?   Head: Normocephalic and atraumatic.  ?   Right Ear: Tympanic membrane normal.  ?   Left Ear: Tympanic membrane normal.  ?   Ears:  ?   Comments: Right EAC tender, erythematous- pain with insertion of speculum ?Eyes:  ?   Conjunctiva/sclera: Conjunctivae normal.  ?Cardiovascular:  ?   Rate and Rhythm: Normal rate.  ?Pulmonary:  ?   Effort:  Pulmonary effort is normal.  ?Neurological:  ?   Mental Status: She is alert.  ?Psychiatric:     ?   Mood and Affect: Mood normal.     ?   Behavior: Behavior normal.     ?   Thought Content: Thought content normal.  ? ? ? ?UC Treatments / Results  ?Labs ?(all labs ordered are listed, but only abnormal results are displayed) ?Labs Reviewed - No data to display ? ?EKG ? ? ?Radiology ?No results found. ? ?Procedures ?Procedures (including critical care time) ? ?Medications Ordered in UC ?Medications - No data to display ? ?Initial Impression / Assessment and Plan / UC Course  ?I have reviewed the triage vital signs and the nursing notes. ? ?Pertinent labs & imaging results that were available during my care of the patient were reviewed by me and considered in my medical decision making (see chart for details). ? ?  ?Will treat to cover possible otitis externa vs other external ear canal infection. Encouraged follow up with any further concerns.  ? ?Final Clinical Impressions(s) / UC Diagnoses  ? ?Final diagnoses:  ?Otalgia, right  ? ?Discharge Instructions   ?None ?  ? ?ED Prescriptions   ? ? Medication Sig Dispense Auth. Provider  ? ciprofloxacin-dexamethasone (CIPRODEX) OTIC suspension Place 4 drops into the right ear 2 (two) times daily for 7 days. 7.5 mL Tomi Bamberger, [name]  ? ?  ? ?PDMP not reviewed this encounter. ?  ?Tomi Bamberger, [name] ?11/28/21 1830 ? ?

## 2021-11-29 NOTE — Telephone Encounter (Signed)
Contacted the patient but unable to leave a message  ?

## 2021-12-01 ENCOUNTER — Ambulatory Visit (INDEPENDENT_AMBULATORY_CARE_PROVIDER_SITE_OTHER): Payer: Medicaid Other | Admitting: Family Medicine

## 2021-12-01 ENCOUNTER — Other Ambulatory Visit (HOSPITAL_COMMUNITY)
Admission: RE | Admit: 2021-12-01 | Discharge: 2021-12-01 | Disposition: A | Discharge status: Home / Self Care | Payer: Medicaid Other | Source: Ambulatory Visit | Attending: Family Medicine | Admitting: Family Medicine

## 2021-12-01 VITALS — BP 100/60 | HR 89 | Wt 120.0 lb

## 2021-12-01 DIAGNOSIS — R87612 Low grade squamous intraepithelial lesion on cytologic smear of cervix (LGSIL): Secondary | ICD-10-CM | POA: Diagnosis not present

## 2021-12-01 DIAGNOSIS — N760 Acute vaginitis: Secondary | ICD-10-CM

## 2021-12-01 DIAGNOSIS — Z3200 Encounter for pregnancy test, result unknown: Secondary | ICD-10-CM | POA: Diagnosis not present

## 2021-12-01 DIAGNOSIS — Z113 Encounter for screening for infections with a predominantly sexual mode of transmission: Secondary | ICD-10-CM

## 2021-12-01 DIAGNOSIS — B9689 Other specified bacterial agents as the cause of diseases classified elsewhere: Secondary | ICD-10-CM

## 2021-12-01 DIAGNOSIS — Z3009 Encounter for other general counseling and advice on contraception: Secondary | ICD-10-CM

## 2021-12-01 LAB — POCT WET PREP (WET MOUNT)
Clue Cells Wet Prep Whiff POC: POSITIVE
Trichomonas Wet Prep HPF POC: ABSENT

## 2021-12-01 LAB — POCT URINE PREGNANCY: Preg Test, Ur: NEGATIVE

## 2021-12-01 NOTE — Telephone Encounter (Signed)
Contacted patient again at [phone] on 3/31 at 1236, unable to leave a message.

## 2021-12-01 NOTE — Progress Notes (Signed)
? ? ?  SUBJECTIVE:  ? ?CHIEF COMPLAINT / HPI:  ? ?Vaginal Discharge: ?Patient is a 21 y.o. female presenting with vaginal odor for several days. 7 months ago she was diagnosed with BV, she is concerned this is the case again. Patient declines HIV and RPR test after counseling, but would like wet prep and GC tests. She is sexually active, reports no sexual activity since before last menstrual cycle. Last LMP 2/20-2/25. She is not on birth control, counseled. 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 She will need repeat pap in 04/2022, reminded patient of this. Also counseled on HPV vaccine. ? ?PERTINENT  PMH / PSH: Seizures ? ?OBJECTIVE:  ? ?BP 100/60   Pulse 89   Wt 120 lb (54.4 kg)   LMP 10/23/2021   SpO2 98%   BMI 21.26 kg/m?   ? ?General: NAD, pleasant, able to participate in exam ?Respiratory: Normal effort, no obvious respiratory distress ?Pelvic: VULVA: normal appearing vulva with no masses, tenderness or lesions, VAGINA: Normal appearing vagina with normal color, no lesions, with scant white discharge present, CERVIX: No lesions, scant discharge present ? ?ASSESSMENT/PLAN:  ? ?Routine screening for STI (sexually transmitted infection) ?Wet prep shows BV. Tx with flagyl 500 mg BID x 7 days. Only 2 episodes in one year. Discussed with patient that she could be more prone to getting BV and if it repeats more frequently, recommend trying boric acid suppositories for ppx. Keep boric acid suppositories away from animals and small children. GC pending. ? ?Birth control counseling ?Counseled patient on risks of pregnancy without BC, especially given history of seizures. Patient is currently on oxcarbamazepine, which can cause birth defects, but is the least risky of other options. Patient has been counseled on this at length during hospital admission, reiterated points today. Pt declines BC after counseling. Recommended prenatal vitamin to prevent neural tube defects. ? ?LGSIL on Pap smear of  cervix ?Reminded patient to repeat pap smear in 5 months. Counseled on HPV vaccine, declined today. Recommend continued discussing with patient. ?  ? ?

## 2021-12-01 NOTE — Patient Instructions (Signed)
It was a pleasure to see you today! ? ?We will get some labs today.  If they are abnormal or we need to do something about them, I will call you.  If they are normal, I will send you a message on MyChart (if it is active) or a letter in the mail.  If you don't hear from us in 2 weeks, please call the office  [phone]. ? ? ?Be Well, ? ?Dr. Shalamar Crays ? ?

## 2021-12-03 ENCOUNTER — Other Ambulatory Visit: Payer: Self-pay | Admitting: Family Medicine

## 2021-12-03 DIAGNOSIS — Z3009 Encounter for other general counseling and advice on contraception: Secondary | ICD-10-CM | POA: Insufficient documentation

## 2021-12-03 DIAGNOSIS — Z3200 Encounter for pregnancy test, result unknown: Secondary | ICD-10-CM

## 2021-12-03 DIAGNOSIS — R87612 Low grade squamous intraepithelial lesion on cytologic smear of cervix (LGSIL): Secondary | ICD-10-CM | POA: Insufficient documentation

## 2021-12-03 MED ORDER — METRONIDAZOLE 500 MG PO TABS: 500.0000 mg | ORAL_TABLET | Freq: Two times a day (BID) | ORAL | 0 refills | Status: DC

## 2021-12-03 MED ORDER — COMPLETENATE 29-1 MG PO CHEW: 1.0000 | CHEWABLE_TABLET | Freq: Every day | ORAL | 11 refills | Status: DC

## 2021-12-03 NOTE — Assessment & Plan Note (Addendum)
Counseled patient on risks of pregnancy without BC, especially given history of seizures. Patient is currently on oxcarbamazepine, which can cause birth defects, but is the least risky of other options. Patient has been counseled on this at length during hospital admission, reiterated points today. Pt declines BC after counseling. Recommended prenatal vitamin to prevent neural tube defects. ?

## 2021-12-03 NOTE — Assessment & Plan Note (Signed)
Reminded patient to repeat pap smear in 5 months. Counseled on HPV vaccine, declined today. Recommend continued discussing with patient. ?

## 2021-12-03 NOTE — Assessment & Plan Note (Signed)
Wet prep shows BV. Tx with flagyl 500 mg BID x 7 days. Only 2 episodes in one year. Discussed with patient that she could be more prone to getting BV and if it repeats more frequently, recommend trying boric acid suppositories for ppx. Keep boric acid suppositories away from animals and small children. GC pending. ?

## 2021-12-04 NOTE — Telephone Encounter (Signed)
I called patient's grandmother, Elease Hashimoto, per DPR. She reports that the best number to reach the patient is at [phone]. ?

## 2021-12-04 NOTE — Telephone Encounter (Signed)
Spoke with grandmother, advise them to discontinue Gabapentin and continue with Trileptal 150 mg BID as scheduled.

## 2021-12-05 LAB — CERVICOVAGINAL ANCILLARY ONLY
Chlamydia: NEGATIVE
Comment: NEGATIVE
Comment: NORMAL
Neisseria Gonorrhea: NEGATIVE

## 2021-12-09 ENCOUNTER — Encounter: Payer: Self-pay | Admitting: Family Medicine

## 2021-12-19 DIAGNOSIS — F4310 Post-traumatic stress disorder, unspecified: Secondary | ICD-10-CM | POA: Diagnosis not present

## 2022-01-03 DIAGNOSIS — F4310 Post-traumatic stress disorder, unspecified: Secondary | ICD-10-CM | POA: Diagnosis not present

## 2022-01-04 DIAGNOSIS — H5213 Myopia, bilateral: Secondary | ICD-10-CM | POA: Diagnosis not present

## 2022-01-17 ENCOUNTER — Ambulatory Visit
Admission: EM | Admit: 2022-01-17 | Discharge: 2022-01-17 | Disposition: A | Discharge status: Home / Self Care | Payer: Medicaid Other | Attending: Urgent Care | Admitting: Urgent Care

## 2022-01-17 DIAGNOSIS — J069 Acute upper respiratory infection, unspecified: Secondary | ICD-10-CM | POA: Diagnosis not present

## 2022-01-17 MED ORDER — CETIRIZINE HCL 10 MG PO TABS: 10.0000 mg | ORAL_TABLET | Freq: Every day | ORAL | 0 refills | Status: DC

## 2022-01-17 MED ORDER — FLUTICASONE PROPIONATE 50 MCG/ACT NA SUSP: 1.0000 | Freq: Every day | NASAL | 0 refills | Status: DC

## 2022-01-17 NOTE — Discharge Instructions (Signed)
Your symptoms sound most consistent with a viral illness, likely adenovirus. Symptoms may last 7-10 days ?Supportive care is the mainstay of treatment - rest, hydration, frequent hand washing, tylenol or ibuprofen as needed for fever or aches.  ?Start taking cetirizine once daily in the morning. ?Please start using the nasal spray sent in today 1-2 sprays daily. ?Stay hydrated! ?

## 2022-01-17 NOTE — ED Triage Notes (Signed)
Pt c/o nasal congestion, sore throat, cough, headache  ? ?Denies ear ache ? ?Onset ~ Monday  ?

## 2022-01-17 NOTE — ED Provider Notes (Signed)
[name]    CSN: [id number] Arrival date & time: 01/17/22  1503      History   Chief Complaint Chief Complaint  Patient presents with   Sore Throat    HPI [name] is a 21 y.o. female.   20yo female with known hx of seizures presents today with complaint of sore throat, nasal congestion, headache, body aches, cough and decreased appetite since Monday. Admits to sx similar to this every summer. States sx started after going outside in the cool cold weather "in just a belly shirt." Believes the cold air caused a URI. Has tried tylenol with no relief, no additional OTC meds. Admits to decreased appetite, however this is chronic.   Sore Throat   Past Medical History:  Diagnosis Date   Seizure (HCC)    Seizures (HCC)    Well child check 04/26/2011    Patient Active Problem List   Diagnosis Date Noted   Birth control counseling 12/03/2021   LGSIL on Pap smear of cervix 12/03/2021   Routine screening for STI (sexually transmitted infection) 07/09/2021   Brief psychotic disorder (HCC)    Hypokalemia    Seizure-like activity (HCC) 01/28/2021   Anemia    Strep throat    Encounter for Nexplanon removal 11/04/2020   Family history of brain cancer 05/20/2015   Seizures (HCC) 10/31/2006    Past Surgical History:  Procedure Laterality Date   LEG SURGERY      OB History   No obstetric history on file.      Home Medications    Prior to Admission medications   Medication Sig Start Date End Date Taking? Authorizing Provider  cetirizine (ZYRTEC) 10 MG tablet Take 1 tablet (10 mg total) by mouth daily. 01/17/22  Yes Tafari Humiston L, PA  fluticasone (FLONASE) 50 MCG/ACT nasal spray Place 1 spray into both nostrils daily. 01/17/22  Yes Demonte Dobratz L, PA  OXcarbazepine (TRILEPTAL) 150 MG tablet TAKE 1 TABLET BY MOUTH TWICE A DAY 07/03/21   Windell Norfolk, MD  Prenatal Vit-Fe Fum-Fe Bisg-FA (NATACHEW) 28-1 MG CHEW CHEW 1 TABLET BY MOUTH DAILY AT 12 NOON.  12/05/21   [name], [name], DO  loratadine (CLARITIN) 10 MG tablet Take 1 tablet (10 mg total) by mouth daily. 10/15/18 01/28/21  Nestor Ramp, MD    Family History Family History  Problem Relation Age of Onset   Healthy Mother    Seizures Father    Seizures Sister     Social History Social History   Tobacco Use   Smoking status: Never   Smokeless tobacco: Never  Substance Use Topics   Alcohol use: No   Drug use: No     Allergies   Lamotrigine, Codeine, Keppra [levetiracetam], Latex, and Tdap [tetanus-diphth-acell pertussis]   Review of Systems Review of Systems As per hpi  Physical Exam Triage Vital Signs ED Triage Vitals [01/17/22 1528]  Enc Vitals Group     BP 131/79     Pulse Rate 93     Resp 18     Temp 97.8 F (36.6 C)     Temp Source Oral     SpO2 97 %     Weight      Height      Head Circumference      Peak Flow      Pain Score 0     Pain Loc      Pain Edu?      Excl. in GC?  No data found.  Updated Vital Signs BP 131/79 (BP Location: Right Arm)   Pulse 93   Temp 97.8 F (36.6 C) (Oral)   Resp 18   SpO2 97%   Visual Acuity Right Eye Distance:   Left Eye Distance:   Bilateral Distance:    Right Eye Near:   Left Eye Near:    Bilateral Near:     Physical Exam Vitals and nursing note reviewed.  Constitutional:      Appearance: She is well-developed and normal weight. She is not ill-appearing, toxic-appearing or diaphoretic.  HENT:     Head: Normocephalic and atraumatic.     Right Ear: Tympanic membrane and ear canal normal. No drainage, swelling or tenderness. No middle ear effusion. Tympanic membrane is not erythematous.     Left Ear: Tympanic membrane and ear canal normal. No drainage, swelling or tenderness.  No middle ear effusion. Tympanic membrane is not erythematous.     Nose: No congestion or rhinorrhea.     Mouth/Throat:     Mouth: Mucous membranes are moist. No oral lesions.     Pharynx: Oropharynx is clear. No pharyngeal  swelling, oropharyngeal exudate, posterior oropharyngeal erythema or uvula swelling.     Tonsils: No tonsillar exudate or tonsillar abscesses.  Eyes:     Extraocular Movements:     Right eye: Normal extraocular motion.     Left eye: Normal extraocular motion.     Conjunctiva/sclera: Conjunctivae normal.     Pupils: Pupils are equal, round, and reactive to light.  Neck:     Thyroid: No thyromegaly.  Cardiovascular:     Rate and Rhythm: Normal rate.     Heart sounds: Normal heart sounds. No murmur heard. Pulmonary:     Effort: Pulmonary effort is normal. No respiratory distress.     Breath sounds: Normal breath sounds. No stridor. No wheezing, rhonchi or rales.  Chest:     Chest wall: No tenderness.  Abdominal:     General: Bowel sounds are normal. There is no distension.     Palpations: Abdomen is soft. There is no mass.     Tenderness: There is no abdominal tenderness. There is no guarding or rebound.     Hernia: No hernia is present.  Musculoskeletal:     Cervical back: Normal range of motion and neck supple.  Lymphadenopathy:     Cervical: No cervical adenopathy.  Skin:    General: Skin is warm.     Capillary Refill: Capillary refill takes less than 2 seconds.     Coloration: Skin is not pale.     Findings: No erythema or rash.  Neurological:     General: No focal deficit present.     Mental Status: She is alert and oriented to person, place, and time.  Psychiatric:        Mood and Affect: Mood normal.        Behavior: Behavior normal.     UC Treatments / Results  Labs (all labs ordered are listed, but only abnormal results are displayed) Labs Reviewed - No data to display  EKG   Radiology No results found.  Procedures Procedures (including critical care time)  Medications Ordered in UC Medications - No data to display  Initial Impression / Assessment and Plan / UC Course  I have reviewed the triage vital signs and the nursing notes.  Pertinent labs &  imaging results that were available during my care of the patient were reviewed by me and considered  in my medical decision making (see chart for details).     Viral URI - supportive measures primarily. Pt used to be on Claritin, is not any longer. Will restart zyrtec for sx management. Flonase to help with congestion. No exam findings that warrant further workup at present time.   Final Clinical Impressions(s) / UC Diagnoses   Final diagnoses:  Viral upper respiratory infection     Discharge Instructions      Your symptoms sound most consistent with a viral illness, likely adenovirus. Symptoms may last 7-10 days Supportive care is the mainstay of treatment - rest, hydration, frequent hand washing, tylenol or ibuprofen as needed for fever or aches.  Start taking cetirizine once daily in the morning. Please start using the nasal spray sent in today 1-2 sprays daily. Stay hydrated!     ED Prescriptions     Medication Sig Dispense Auth. Provider   cetirizine (ZYRTEC) 10 MG tablet Take 1 tablet (10 mg total) by mouth daily. 30 tablet Al Gagen L, PA   fluticasone (FLONASE) 50 MCG/ACT nasal spray Place 1 spray into both nostrils daily. 16 mL Ebbie Cherry L, PA      PDMP not reviewed this encounter.

## 2022-01-20 ENCOUNTER — Emergency Department (HOSPITAL_COMMUNITY)
Admission: EM | Admit: 2022-01-20 | Discharge: 2022-01-20 | Disposition: A | Discharge status: Home / Self Care | Payer: Medicaid Other | Attending: Emergency Medicine | Admitting: Emergency Medicine

## 2022-01-20 ENCOUNTER — Encounter (HOSPITAL_COMMUNITY): Payer: Self-pay | Admitting: Emergency Medicine

## 2022-01-20 ENCOUNTER — Emergency Department (HOSPITAL_COMMUNITY): Payer: Medicaid Other

## 2022-01-20 ENCOUNTER — Other Ambulatory Visit: Payer: Self-pay

## 2022-01-20 DIAGNOSIS — S93491A Sprain of other ligament of right ankle, initial encounter: Secondary | ICD-10-CM | POA: Insufficient documentation

## 2022-01-20 DIAGNOSIS — M25571 Pain in right ankle and joints of right foot: Secondary | ICD-10-CM | POA: Diagnosis not present

## 2022-01-20 DIAGNOSIS — X509XXA Other and unspecified overexertion or strenuous movements or postures, initial encounter: Secondary | ICD-10-CM | POA: Diagnosis not present

## 2022-01-20 DIAGNOSIS — S93431A Sprain of tibiofibular ligament of right ankle, initial encounter: Secondary | ICD-10-CM | POA: Diagnosis not present

## 2022-01-20 DIAGNOSIS — S99911A Unspecified injury of right ankle, initial encounter: Secondary | ICD-10-CM | POA: Diagnosis present

## 2022-01-20 DIAGNOSIS — Y9302 Activity, running: Secondary | ICD-10-CM | POA: Diagnosis not present

## 2022-01-20 DIAGNOSIS — Z91040 Latex allergy status: Secondary | ICD-10-CM | POA: Diagnosis not present

## 2022-01-20 MED ORDER — ACETAMINOPHEN 500 MG PO TABS
1000.0000 mg | ORAL_TABLET | Freq: Once | ORAL | Status: AC
  Administered 2022-01-20: 1000 mg via ORAL
  Filled 2022-01-20: qty 2

## 2022-01-20 NOTE — Progress Notes (Signed)
Orthopedic Tech Progress Note Patient Details:  [name] 12/04/2000 [id number]  Ortho Devices Type of Ortho Device: Crutches, Ankle Air splint Ortho Device/Splint Location: rle Ortho Device/Splint Interventions: Ordered, Application, Adjustment   Post Interventions Patient Tolerated: Well Instructions Provided: Care of device, Adjustment of device  Trinna Post 01/20/2022, 9:52 PM

## 2022-01-20 NOTE — Discharge Instructions (Addendum)
Please use crutches and ankle brace as indicated.  Follow-up with orthopedist Dr. Sherilyn Dacosta for further evaluation.  While at home please rest, ice, elevate your ankle to help reduce pain/inflammation.  You can take ibuprofen and Tylenol as needed for pain.  Return to the ED for any new/worsening symptoms.

## 2022-01-20 NOTE — ED Notes (Signed)
Patient verbalizes understanding of discharge instructions. Opportunity for questioning and answers were provided. Armband removed by staff, pt discharged from ED pt ambulatory to lobby  

## 2022-01-20 NOTE — ED Triage Notes (Signed)
Pt was running outside playing with cousin and ran into cousin this afternoon.  States she heard R ankle pop.  C/o pain and swelling to R ankle.

## 2022-01-20 NOTE — ED Notes (Signed)
Ortho tech called 

## 2022-01-20 NOTE — ED Provider Notes (Signed)
[facility] EMERGENCY DEPARTMENT Provider Note   CSN: [id number] Arrival date & time: 01/20/22  1832     History  Chief Complaint  Patient presents with   Ankle Pain    [name] is a 21 y.o. female who presents to the ED today with complaints of sudden onset, constant, sharp, right ankle pain status post injury that occurred earlier today.  Patient reports that she was running around in her yard playing with her family member wearing slides when she fell and twisted her ankle.  She states that she has had immediate pain since that time and heard a popping noise.  She has been unable to bear weight.  She is not taking anything for pain prior to arrival.  Does report history of ankle sprain on the same side approximately 1 year ago with intermittent popping sensation in the ankle joint itself.  She has no other complaints at this time.  Denies any head injury or loss of consciousness.  The history is provided by the patient and medical records.      Home Medications Prior to Admission medications   Medication Sig Start Date End Date Taking? Authorizing Provider  cetirizine (ZYRTEC) 10 MG tablet Take 1 tablet (10 mg total) by mouth daily. 01/17/22   Crain, Whitney L, PA  fluticasone (FLONASE) 50 MCG/ACT nasal spray Place 1 spray into both nostrils daily. 01/17/22   Crain, Whitney L, PA  OXcarbazepine (TRILEPTAL) 150 MG tablet TAKE 1 TABLET BY MOUTH TWICE A DAY 07/03/21   Alric Ran, MD  Prenatal Vit-Fe Fum-Fe Bisg-FA (NATACHEW) 28-1 MG CHEW CHEW 1 TABLET BY MOUTH DAILY AT 12 NOON. 12/05/21   [name], [name], DO  loratadine (CLARITIN) 10 MG tablet Take 1 tablet (10 mg total) by mouth daily. 10/15/18 01/28/21  Dickie La, MD      Allergies    Lamotrigine, Codeine, Keppra [levetiracetam], Latex, and Tdap [tetanus-diphth-acell pertussis]    Review of Systems   Review of Systems  Constitutional:  Negative for chills and fever.  Musculoskeletal:  Positive for  arthralgias and joint swelling.  Skin:  Negative for color change and wound.  All other systems reviewed and are negative.  Physical Exam Updated Vital Signs BP 115/86   Pulse (!) 101   Temp 98.5 F (36.9 C)   Resp 18   LMP 11/15/2021 (Approximate)   SpO2 100%  Physical Exam Vitals and nursing note reviewed.  Constitutional:      Appearance: She is not ill-appearing.  HENT:     Head: Normocephalic and atraumatic.  Eyes:     Conjunctiva/sclera: Conjunctivae normal.  Cardiovascular:     Rate and Rhythm: Normal rate and regular rhythm.  Pulmonary:     Effort: Pulmonary effort is normal.     Breath sounds: Normal breath sounds.  Musculoskeletal:     Comments: Moderate swelling noted to right ankle along lateral malleolus with associated tenderness palpation to same.  Range of motion limited to ankle secondary to pain.  No tenderness to the foot, calf, Achilles tendon.  2+ DP pulse.  Cap refill less than 2 seconds to all digits.  Skin:    General: Skin is warm and dry.     Coloration: Skin is not jaundiced.  Neurological:     Mental Status: She is alert.    ED Results / Procedures / Treatments   Labs (all labs ordered are listed, but only abnormal results are displayed) Labs Reviewed - No data to display  EKG None  Radiology DG Ankle Complete Right  Result Date: 01/20/2022 CLINICAL DATA:  Right ankle pain after injury EXAM: RIGHT ANKLE - COMPLETE 3+ VIEW COMPARISON:  07/26/2019 FINDINGS: There is no evidence of acute fracture or dislocation. Possible small tibiotalar joint effusion. There is no evidence of arthropathy or other focal bone abnormality. Soft tissues are unremarkable. IMPRESSION: 1. No acute fracture or dislocation of the right ankle. 2. Possible small tibiotalar joint effusion. Electronically Signed   By: Davina Poke D.O.   On: 01/20/2022 19:05    Procedures Procedures    Medications Ordered in ED Medications - No data to display  ED Course/  Medical Decision Making/ A&P                           Medical Decision Making 21 year old female who presents to the ED today status post right ankle injury earlier today.  On arrival to the ED vitals are stable.  Patient had an x-ray done prior to being seen without any acute bony abnormality.  X-ray was independently interpreted by myself prior to confirmation by radiologist.  It does appear that she has a small possible joint effusion of the tibiotalar joint.  On exam she is noted to have moderate swelling to the lateral malleolus with tenderness palpation to same.  She is neurovascularly intact.  Concern for possible ATFL injury.  We will provide retches and ankle brace with Ortho follow-up.  RICE therapy and ibuprofen/Tylenol has been discussed with patient.  She is in agreement with plan and stable for discharge home.  Problems Addressed: Sprain of anterior talofibular ligament of right ankle, initial encounter: acute illness or injury  Amount and/or Complexity of Data Reviewed Radiology: ordered.          Final Clinical Impression(s) / ED Diagnoses Final diagnoses:  Sprain of anterior talofibular ligament of right ankle, initial encounter    Rx / DC Orders ED Discharge Orders     None        Discharge Instructions      Please use crutches and ankle brace as indicated.  Follow-up with orthopedist Dr. Mable Fill for further evaluation.  While at home please rest, ice, elevate your ankle to help reduce pain/inflammation.  You can take ibuprofen and Tylenol as needed for pain.  Return to the ED for any new/worsening symptoms.       Eustaquio Maize, [name] 01/20/22 1937    Wyvonnia Dusky, MD 01/20/22 2113

## 2022-01-22 ENCOUNTER — Telehealth: Payer: Self-pay

## 2022-01-22 NOTE — Telephone Encounter (Signed)
Transition Care Management Unsuccessful Follow-up Telephone Call  Date of discharge and from where:  01/20/2022 from [facility]  Attempts:  1st Attempt  Reason for unsuccessful TCM follow-up call:  Left voice message

## 2022-01-23 DIAGNOSIS — H52223 Regular astigmatism, bilateral: Secondary | ICD-10-CM | POA: Diagnosis not present

## 2022-01-23 DIAGNOSIS — H5213 Myopia, bilateral: Secondary | ICD-10-CM | POA: Diagnosis not present

## 2022-01-23 NOTE — Telephone Encounter (Signed)
Transition Care Management Unsuccessful Follow-up Telephone Call  Date of discharge and from where:  01/20/2022 from [facility]  Attempts:  2nd Attempt  Reason for unsuccessful TCM follow-up call:  Left voice message

## 2022-01-24 NOTE — Telephone Encounter (Signed)
Transition Care Management Unsuccessful Follow-up Telephone Call  Date of discharge and from where:  01/20/2022 from [facility]  Attempts:  3rd Attempt  Reason for unsuccessful TCM follow-up call:  Unable to reach patient    

## 2022-01-26 DIAGNOSIS — S93401A Sprain of unspecified ligament of right ankle, initial encounter: Secondary | ICD-10-CM | POA: Diagnosis not present

## 2022-02-05 ENCOUNTER — Ambulatory Visit
Admission: EM | Admit: 2022-02-05 | Discharge: 2022-02-05 | Disposition: A | Discharge status: Home / Self Care | Payer: Medicaid Other | Attending: Internal Medicine | Admitting: Internal Medicine

## 2022-02-05 DIAGNOSIS — N760 Acute vaginitis: Secondary | ICD-10-CM | POA: Diagnosis not present

## 2022-02-05 DIAGNOSIS — Z113 Encounter for screening for infections with a predominantly sexual mode of transmission: Secondary | ICD-10-CM

## 2022-02-05 LAB — POCT URINALYSIS DIP (MANUAL ENTRY)
Bilirubin, UA: NEGATIVE
Glucose, UA: NEGATIVE mg/dL
Ketones, POC UA: NEGATIVE mg/dL
Nitrite, UA: NEGATIVE
Protein Ur, POC: NEGATIVE mg/dL
Spec Grav, UA: <=1.005 — AB (ref 1.010–1.025)
Urobilinogen, UA: 0.2 E.U./dL
pH, UA: 6 (ref 5.0–8.0)

## 2022-02-05 LAB — POCT URINE PREGNANCY: Preg Test, Ur: NEGATIVE

## 2022-02-05 MED ORDER — CEPHALEXIN 500 MG PO CAPS: 500.0000 mg | ORAL_CAPSULE | Freq: Two times a day (BID) | ORAL | 0 refills | Status: AC

## 2022-02-05 NOTE — Discharge Instructions (Signed)
We will call you with recommendations if labs are abnormal Please avoid sexual intercourse until lab results are available Increase oral fluid intake Take antibiotics as prescribed Return to urgent care if you have any worsening symptoms.

## 2022-02-05 NOTE — ED Provider Notes (Signed)
[name]    CSN: [id number] Arrival date & time: 02/05/22  1159      History   Chief Complaint Chief Complaint  Patient presents with   dysuria/sti screening    HPI [name] is a 21 y.o. female comes to the urgent care with 2-day history of vaginal discharge.  Vaginal discharge was clear and associated with some dysuria, urgency or frequency.  No abdominal pain.  Patient was engaging in unprotected sexual intercourse over the weekend with a random partner.  She denies any nausea or vomiting.  No fever, chills or flank pain.  She complains of oral discomfort but denies any rash around the mouth.  No sore throat.Marland Kitchen   HPI  Past Medical History:  Diagnosis Date   Seizure ([place])    Seizures ([place])    Well child check 04/26/2011    Patient Active Problem List   Diagnosis Date Noted   Birth control counseling 12/03/2021   LGSIL on Pap smear of cervix 12/03/2021   Routine screening for STI (sexually transmitted infection) 07/09/2021   Brief psychotic disorder ([place])    Hypokalemia    Seizure-like activity ([place]) 01/28/2021   Anemia    Strep throat    Encounter for Nexplanon removal 11/04/2020   Family history of brain cancer 05/20/2015   Seizures ([place]) 10/31/2006    Past Surgical History:  Procedure Laterality Date   LEG SURGERY      OB History   No obstetric history on file.      Home Medications    Prior to Admission medications   Medication Sig Start Date End Date Taking? Authorizing Provider  cephALEXin (KEFLEX) 500 MG capsule Take 1 capsule (500 mg total) by mouth 2 (two) times daily for 5 days. 02/05/22 02/10/22 Yes Ismerai Bin, Myrene Galas, MD  OXcarbazepine (TRILEPTAL) 150 MG tablet TAKE 1 TABLET BY MOUTH TWICE A DAY 07/03/21   Alric Ran, MD  loratadine (CLARITIN) 10 MG tablet Take 1 tablet (10 mg total) by mouth daily. 10/15/18 01/28/21  Dickie La, MD    Family History Family History  Problem Relation Age of Onset   Healthy Mother     Seizures Father    Seizures Sister     Social History Social History   Tobacco Use   Smoking status: Never   Smokeless tobacco: Never  Substance Use Topics   Alcohol use: No   Drug use: No     Allergies   Lamotrigine, Codeine, Keppra [levetiracetam], Latex, and Tdap [tetanus-diphth-acell pertussis]   Review of Systems Review of Systems  Genitourinary:  Positive for dysuria, frequency, urgency and vaginal discharge. Negative for vaginal pain.    Physical Exam Triage Vital Signs ED Triage Vitals [02/05/22 1251]  Enc Vitals Group     BP 119/80     Pulse Rate 100     Resp 18     Temp 98.2 F (36.8 C)     Temp Source Oral     SpO2 99 %     Weight      Height      Head Circumference      Peak Flow      Pain Score 0     Pain Loc      Pain Edu?      Excl. in [place]?    No data found.  Updated Vital Signs BP 119/80 (BP Location: Right Arm)   Pulse 100   Temp 98.2 F (36.8 C) (Oral)   Resp 18  SpO2 99%   Visual Acuity Right Eye Distance:   Left Eye Distance:   Bilateral Distance:    Right Eye Near:   Left Eye Near:    Bilateral Near:     Physical Exam Constitutional:      Appearance: She is not ill-appearing or diaphoretic.  Cardiovascular:     Rate and Rhythm: Normal rate and regular rhythm.  Pulmonary:     Effort: Pulmonary effort is normal.     Breath sounds: Normal breath sounds.  Abdominal:     General: Bowel sounds are normal.     Palpations: Abdomen is soft.  Neurological:     General: No focal deficit present.     Mental Status: She is alert and oriented to person, place, and time.     UC Treatments / Results  Labs (all labs ordered are listed, but only abnormal results are displayed) Labs Reviewed  POCT URINALYSIS DIP (MANUAL ENTRY) - Abnormal; Notable for the following components:      Result Value   Color, UA colorless (*)    Spec Grav, UA <=1.005 (*)    Blood, UA large (*)    Leukocytes, UA Trace (*)    All other components  within normal limits  URINE CULTURE  POCT URINE PREGNANCY  CERVICOVAGINAL ANCILLARY ONLY    EKG   Radiology No results found.  Procedures Procedures (including critical care time)  Medications Ordered in UC Medications - No data to display  Initial Impression / Assessment and Plan / UC Course  I have reviewed the triage vital signs and the nursing notes.  Pertinent labs & imaging results that were available during my care of the patient were reviewed by me and considered in my medical decision making (see chart for details).     1.  Acute vaginitis: Point-of-care urinalysis is positive for blood and leukocyte esterase Keflex 500 mg twice daily for 5 days Urine cultures have been sent.  Cervical vaginal swab for GC/chlamydia/trichomonas/vaginal yeast/BV Attempt at obtaining samples for RPR and HIV were unsuccessful. We will call patient with recommendations if labs are abnormal. Safe sex practices recommended Return to urgent care if symptoms worsen. Final Clinical Impressions(s) / UC Diagnoses   Final diagnoses:  Routine screening for STI  Acute vaginitis     Discharge Instructions      We will call you with recommendations if labs are abnormal Please avoid sexual intercourse until lab results are available Increase oral fluid intake Take antibiotics as prescribed Return to urgent care if you have any worsening symptoms.   ED Prescriptions     Medication Sig Dispense Auth. Provider   cephALEXin (KEFLEX) 500 MG capsule Take 1 capsule (500 mg total) by mouth 2 (two) times daily for 5 days. 10 capsule Ladeidra Borys, Myrene Galas, MD      PDMP not reviewed this encounter.   Chase Picket, MD 02/05/22 [phone]

## 2022-02-05 NOTE — ED Triage Notes (Signed)
Pt c/o dysuria, circumoral burning sensation, frequency, clear vaginal discharge,   Denies vaginal discharge, odor, pelvic pain, new lower back pain,   Onset ~ Saturday   No known exposure.

## 2022-02-06 ENCOUNTER — Encounter: Payer: Self-pay | Admitting: *Deleted

## 2022-02-06 LAB — URINE CULTURE: Culture: NO GROWTH

## 2022-02-07 LAB — CERVICOVAGINAL ANCILLARY ONLY
Candida Glabrata: NEGATIVE
Candida Vaginitis: NEGATIVE
Chlamydia: NEGATIVE
Comment: NEGATIVE
Comment: NEGATIVE
Comment: NEGATIVE
Comment: NEGATIVE
Comment: NORMAL
Neisseria Gonorrhea: NEGATIVE
Trichomonas: NEGATIVE

## 2022-03-09 DIAGNOSIS — F4310 Post-traumatic stress disorder, unspecified: Secondary | ICD-10-CM | POA: Diagnosis not present

## 2022-03-23 ENCOUNTER — Ambulatory Visit: Payer: Medicaid Other | Admitting: Family Medicine

## 2022-03-26 ENCOUNTER — Other Ambulatory Visit: Payer: Self-pay

## 2022-03-26 ENCOUNTER — Encounter (HOSPITAL_COMMUNITY): Payer: Self-pay | Admitting: *Deleted

## 2022-03-26 ENCOUNTER — Emergency Department (HOSPITAL_COMMUNITY)
Admission: EM | Admit: 2022-03-26 | Discharge: 2022-03-26 | Disposition: A | Discharge status: Home / Self Care | Payer: Medicaid Other | Attending: Emergency Medicine | Admitting: Emergency Medicine

## 2022-03-26 DIAGNOSIS — Z91040 Latex allergy status: Secondary | ICD-10-CM | POA: Insufficient documentation

## 2022-03-26 DIAGNOSIS — R569 Unspecified convulsions: Secondary | ICD-10-CM | POA: Insufficient documentation

## 2022-03-26 DIAGNOSIS — Z79899 Other long term (current) drug therapy: Secondary | ICD-10-CM | POA: Insufficient documentation

## 2022-03-26 DIAGNOSIS — I10 Essential (primary) hypertension: Secondary | ICD-10-CM | POA: Diagnosis not present

## 2022-03-26 DIAGNOSIS — R000 Tachycardia, unspecified: Secondary | ICD-10-CM | POA: Diagnosis not present

## 2022-03-26 DIAGNOSIS — R404 Transient alteration of awareness: Secondary | ICD-10-CM | POA: Diagnosis not present

## 2022-03-26 DIAGNOSIS — F29 Unspecified psychosis not due to a substance or known physiological condition: Secondary | ICD-10-CM | POA: Diagnosis not present

## 2022-03-26 DIAGNOSIS — F4310 Post-traumatic stress disorder, unspecified: Secondary | ICD-10-CM | POA: Diagnosis not present

## 2022-03-26 LAB — BASIC METABOLIC PANEL
Anion gap: 6 (ref 5–15)
BUN: 11 mg/dL (ref 6–20)
CO2: 25 mmol/L (ref 22–32)
Calcium: 9.4 mg/dL (ref 8.9–10.3)
Chloride: 108 mmol/L (ref 98–111)
Creatinine, Ser: 0.75 mg/dL (ref 0.44–1.00)
GFR, Estimated: >60 mL/min (ref >60)
Glucose, Bld: 103 mg/dL — ABNORMAL HIGH (ref 70–99)
Potassium: 3.8 mmol/L (ref 3.5–5.1)
Sodium: 139 mmol/L (ref 135–145)

## 2022-03-26 LAB — I-STAT BETA HCG BLOOD, ED (MC, WL, AP ONLY): I-stat hCG, quantitative: <5 m[IU]/mL (ref <5)

## 2022-03-26 LAB — CBC
HCT: 39.3 % (ref 36.0–46.0)
Hemoglobin: 12.8 g/dL (ref 12.0–15.0)
MCH: 29 pg (ref 26.0–34.0)
MCHC: 32.6 g/dL (ref 30.0–36.0)
MCV: 89.1 fL (ref 80.0–100.0)
Platelets: 295 10*3/uL (ref 150–400)
RBC: 4.41 MIL/uL (ref 3.87–5.11)
RDW: 13.6 % (ref 11.5–15.5)
WBC: 3.8 10*3/uL — ABNORMAL LOW (ref 4.0–10.5)
nRBC: 0 % (ref 0.0–0.2)

## 2022-03-26 LAB — MAGNESIUM: Magnesium: 2.1 mg/dL (ref 1.7–2.4)

## 2022-03-26 LAB — ETHANOL: Alcohol, Ethyl (B): <10 mg/dL (ref <10)

## 2022-03-26 LAB — LACTIC ACID, PLASMA: Lactic Acid, Venous: 2.4 mmol/L (ref 0.5–1.9)

## 2022-03-26 MED ORDER — SODIUM CHLORIDE 0.9 % IV BOLUS: 1000.0000 mL | Freq: Once | INTRAVENOUS | Status: DC

## 2022-03-26 NOTE — ED Notes (Signed)
Lab called at this time to add on ordered magnesium. Per lab they will add on to previous sent specimen at this time.

## 2022-03-26 NOTE — ED Triage Notes (Signed)
Pt state hx of seizures, and when she gets upset she has a seizures. States she has not missed any doses of her medications-says she only takes Trileptal for her seizures. Pt is alert and oriented, states she feels dizzy and headache.

## 2022-03-26 NOTE — ED Notes (Signed)
All discharge instructions reviewed with patient including follow up care. Patient verbalized understanding and had no other questions. Patient stable and ambulatory at time of discharge and was discharged home with ride from grandmother.

## 2022-03-26 NOTE — ED Provider Notes (Signed)
[facility] EMERGENCY DEPARTMENT Provider Note   CSN: [id number] Arrival date & time: 03/26/22  1908     History PMH: Seizure Disorder Chief Complaint  Patient presents with   Seizures    [name] is a 21 y.o. female. Presents after breakthrough seizure that occurred today.  She states that today she found one of her family members deceased on the couch.  She says she immediately got very stressed out, and reportedly had a witnessed tonic-clonic seizure.  This has happened in the past when she has stressors in her life.  Per EMS, she was having purposeful movement of the extremities, shaking, forced deviation of eyes upward.  She was given Versed.  Per patient, she is feeling a little groggy.  She reports no change in her medications recently.  She last took her medications this morning.  She is only on Trileptal twice daily. She reports a psychotic allergy to Keppra.   Seizures      Home Medications Prior to Admission medications   Medication Sig Start Date End Date Taking? Authorizing Provider  OXcarbazepine (TRILEPTAL) 150 MG tablet TAKE 1 TABLET BY MOUTH TWICE A DAY 07/03/21   Windell Norfolk, MD  loratadine (CLARITIN) 10 MG tablet Take 1 tablet (10 mg total) by mouth daily. 10/15/18 01/28/21  Nestor Ramp, MD      Allergies    Lamotrigine, Codeine, Keppra [levetiracetam], Latex, and Tdap [tetanus-diphth-acell pertussis]    Review of Systems   Review of Systems  Constitutional:  Negative for chills and fever.  Neurological:  Positive for seizures. Negative for dizziness, facial asymmetry, speech difficulty, weakness and headaches.  Psychiatric/Behavioral:  Negative for confusion.   All other systems reviewed and are negative.   Physical Exam Updated Vital Signs BP 98/64   Pulse 91   Temp 98.2 F (36.8 C) (Oral)   Resp 18   Ht 5\' 3"  (1.6 m)   Wt 51.7 kg   LMP 02/26/2022 (Approximate)   SpO2 93%   BMI 20.19 kg/m  Physical Exam Vitals and  nursing note reviewed.  Constitutional:      General: She is not in acute distress.    Appearance: Normal appearance. She is well-developed. She is not ill-appearing, toxic-appearing or diaphoretic.  HENT:     Head: Normocephalic and atraumatic.     Nose: No nasal deformity.     Mouth/Throat:     Lips: Pink. No lesions.  Eyes:     General: Gaze aligned appropriately. No scleral icterus.       Right eye: No discharge.        Left eye: No discharge.     Conjunctiva/sclera: Conjunctivae normal.     Right eye: Right conjunctiva is not injected. No exudate or hemorrhage.    Left eye: Left conjunctiva is not injected. No exudate or hemorrhage. Cardiovascular:     Rate and Rhythm: Tachycardia present.  Pulmonary:     Effort: Pulmonary effort is normal. No respiratory distress.  Skin:    General: Skin is warm and dry.  Neurological:     Mental Status: She is alert and oriented to person, place, and time.     Comments: Patient has slow responses.  She is alert and oriented x3.  She has 5/5 motor strength in all 4 extremities. No facial Droop. EOM intact.   Psychiatric:        Mood and Affect: Mood normal.        Speech: Speech normal.  Behavior: Behavior normal. Behavior is cooperative.     ED Results / Procedures / Treatments   Labs (all labs ordered are listed, but only abnormal results are displayed) Labs Reviewed  BASIC METABOLIC PANEL - Abnormal; Notable for the following components:      Result Value   Glucose, Bld 103 (*)    All other components within normal limits  CBC - Abnormal; Notable for the following components:   WBC 3.8 (*)    All other components within normal limits  LACTIC ACID, PLASMA - Abnormal; Notable for the following components:   Lactic Acid, Venous 2.4 (*)    All other components within normal limits  ETHANOL  MAGNESIUM  RAPID URINE DRUG SCREEN, HOSP PERFORMED  10-HYDROXYCARBAZEPINE  LACTIC ACID, PLASMA  I-STAT BETA HCG BLOOD, ED ([facility], [facility], [facility]  ONLY)    EKG None  Radiology No results found.  Procedures Procedures  This patient was on telemetry or cardiac monitoring during their time in the ED.    Medications Ordered in ED Medications - No data to display  ED Course/ Medical Decision Making/ A&P                            Medical Decision Making Amount and/or Complexity of Data Reviewed Labs: ordered.    MDM  This is a 21 y.o. female who presents to the ED with seizure like activity  Initial Impression  Patient is a known epileptic who presented with a seizure that occurred today after a stressor event. No hx of medication changes.  Per Chart Review, she has not been seen here for seizures since last Fall in 2022 when she was admitted. She appears to be in no acute distress.  She is tachycardic with heart rate into 117, but other vitals are stable. Her neuro exam is intact, but she does seem to be very slow to respond.  She is likely postictal Labs ordered. No indication for CT scan at this time.  I personally ordered, reviewed, and interpreted all laboratory work and imaging and agree with radiologist interpretation. Results interpreted below: wbc 3.8 (chronic) BMP okay Mag normal Not pregnant Lactate 2.4 (consistent with possible seizure) ETOH negative. Serum drug levels are still pending.  Assessment/Plan:  @2050 , I consulted Neurology and discussed case with Dr. . He feels that this is likely due to a stress related event. Requests addition of Mag level and clarification on medications that she is on since there is a note discrepancy from original triage RN. He wants Georg Ruddle to call him back after 2-3 hours of observation. Likely discharge if no additional seizures.  @2058 , I discussed medications with patient who states she is definitely only on Trileptal and her Depakote was discontinued several months ago. She is also noted to be less lethargic than earlier.  @2332 , I rediscussed case with Dr. [place].  Since she has had no return of symptoms, normal Mag, she is stable for discharge. 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 No change in medications at this time. Recommends outpatient follow up and cessation of driving for at least 6 months.      Charting Requirements Additional history is obtained from:  Independent historian External Records from outside source obtained and reviewed including: Prior admission note for prior seizures Social Determinants of Health:  Death of a family member Pertinant PMH that complicates patient's illness: Seizure disorder  Patient Care Problems that were addressed during this visit: - Seizure Like Activity:  Acute illness with complication This patient was maintained on a cardiac monitor/telemetry. I personally viewed and interpreted the cardiac monitor which reveals an underlying rhythm of ST, NSR Medications given in ED: n/a Reevaluation of the patient after these medicines showed that the patient improved I have reviewed home medications and made changes accordingly.  Critical Care Interventions: n/a Consultations: Neurology Disposition: Discharge  Portions of this note were generated with Dragon dictation software. Dictation errors may occur despite best attempts at proofreading.    Final Clinical Impression(s) / ED Diagnoses Final diagnoses:  Seizure-like activity [facility])    Rx / DC Orders ED Discharge Orders     None         Claudie Leach, [name] 03/26/22 2333    Vanetta Mulders, MD 03/27/22 2257

## 2022-03-26 NOTE — Discharge Instructions (Signed)
Please call your Neurologist tomorrow morning to schedule a follow up visit. Please continue to take your Trileptal medication as prescribed. Our on call Neurologist does not recommend that you drive for 6 months or until evaluated by your neurologist.   Please return with worsening symptoms.

## 2022-03-26 NOTE — ED Triage Notes (Signed)
Pt comes from home, via [facility], per report by ems, had seizure after finding a family member deceased. Pt has hx of epilepsy, takes Depakote and possibly another medication for seizures. En route, pt having purposeful movement of extremities, shaking, forced deviation of eyes upward,  Vitals...hr 120's,110/62, cbg 125/ gave versed 5 IM. Iv established in the left [facility].

## 2022-03-27 ENCOUNTER — Telehealth: Payer: Self-pay

## 2022-03-27 NOTE — Telephone Encounter (Signed)
Transition Care Management Unsuccessful Follow-up Telephone Call  Date of discharge and from where:  03/26/2022 from [facility]  Attempts:  1st Attempt  Reason for unsuccessful TCM follow-up call:  Unable to leave message

## 2022-03-28 LAB — 10-HYDROXYCARBAZEPINE: Triliptal/MTB(Oxcarbazepin): 7 ug/mL — ABNORMAL LOW (ref 10–35)

## 2022-03-28 NOTE — Telephone Encounter (Signed)
Transition Care Management Unsuccessful Follow-up Telephone Call  Date of discharge and from where:  03/26/2022 from [facility]  Attempts:  2nd Attempt  Reason for unsuccessful TCM follow-up call:  Unable to leave message

## 2022-03-30 NOTE — Telephone Encounter (Signed)
Transition Care Management Unsuccessful Follow-up Telephone Call  Date of discharge and from where:  03/26/2022 from [facility]  Attempts:  3rd Attempt  Reason for unsuccessful TCM follow-up call:  Unable to reach patient

## 2022-04-10 DIAGNOSIS — F4310 Post-traumatic stress disorder, unspecified: Secondary | ICD-10-CM | POA: Diagnosis not present

## 2022-04-19 DIAGNOSIS — Z79899 Other long term (current) drug therapy: Secondary | ICD-10-CM | POA: Diagnosis not present

## 2022-04-19 DIAGNOSIS — B9689 Other specified bacterial agents as the cause of diseases classified elsewhere: Secondary | ICD-10-CM | POA: Diagnosis not present

## 2022-04-19 DIAGNOSIS — Z20822 Contact with and (suspected) exposure to covid-19: Secondary | ICD-10-CM | POA: Diagnosis not present

## 2022-04-19 DIAGNOSIS — N760 Acute vaginitis: Secondary | ICD-10-CM | POA: Diagnosis not present

## 2022-05-08 DIAGNOSIS — F4310 Post-traumatic stress disorder, unspecified: Secondary | ICD-10-CM | POA: Diagnosis not present

## 2022-05-15 ENCOUNTER — Ambulatory Visit: Payer: Medicaid Other | Admitting: Neurology

## 2022-05-15 ENCOUNTER — Encounter: Payer: Self-pay | Admitting: Neurology

## 2022-05-15 VITALS — BP 124/84 | HR 100 | Ht 63.0 in | Wt 112.0 lb

## 2022-05-15 DIAGNOSIS — G40909 Epilepsy, unspecified, not intractable, without status epilepticus: Secondary | ICD-10-CM

## 2022-05-15 MED ORDER — OXCARBAZEPINE 300 MG PO TABS: 300.0000 mg | ORAL_TABLET | Freq: Two times a day (BID) | ORAL | 3 refills | Status: DC

## 2022-05-15 NOTE — Patient Instructions (Signed)
Increase Oxcarbazepine to 300 mg twice daily  Continue your other medications  Follow up in 6 months or sooner if worse

## 2022-05-15 NOTE — Progress Notes (Signed)
[facility]  PATIENT: [name] DOB: May 07, 2001  REFERRING CLINICIAN: Cora Collum, DO HISTORY FROM: Patient/Grandmother and chart review  REASON FOR VISIT: Seizures    HISTORICAL  CHIEF COMPLAINT:  Chief Complaint  Patient presents with   Follow-up    Rm 13 with grandmother here for f/u on seizures. Pt reports 2 seizures since last f/u. Reports one was stressed induced (she found her cousin dead in her grandmothers house)   INTERVAL HISTORY 05/15/2022:  Patient presents today for follow-up, she is accompanied by her grandmother.  Overall she is doing well, she is compliant with the oxcarbazepine 150 mg twice daily but reported having 2 breakthrough seizures in the month of June.  She reported that it was stress related as she found her deceased cousin in his room, she had a seizure then and on the day of the funeral also she had another seizure.  Other than that she has been compliant with her medication.  She still continue to follow with behavioral health.  Her last oxcarbazepine level in July was low at 7.   INTERVAL HISTORY 10/17/2021:  Patient present today for follow-up, she is accompanied by her grandmother.  Denies any seizures since last visit in November.  Denies any side effect from the medication, she is compliant with oxcarbazepine 150 mg twice daily.  Headaches are controlled also with gabapentin 300 mg at night.  Denies any additional complaints and no concerns.  Currently, she is applying for disability regarding her seizure disorder.   INTERVAL HISTORY 07/05/2021 Patient presented there for follow-up with mother grandmother, last visit was on September 30 at that time plan was to continue ox carb 150 twice daily.  She report compliance with medication.  No seizures since last visit and denies any side effect from the medication.  Overall she is doing well.  Her last oxcarb level was 8. doing well, compliant with the medications, no side effects  from the medications. In terms of her headaches, she reported improvement, she is on gabapentin 300 mg nightly and also reported good sleep.  No current question or concern.   HISTORY OF PRESENT ILLNESS:  This is a 21 year old woman with past medical history of seizures, anxiety who is presenting after hospital discharge for breakthrough seizure.  Patient was admitted from September 7 September 10 after having a seizure at her outpatient visit.  During her admission, she was trialed on levetiracetam which caused acute psychosis, she was seen by psychiatry, levetiracetam discontinued and patient was started on oxcarbazepine with return to baseline mental status.  Hospital course listed below.  She had a video EEG which did not show any seizure even though patient report of the leads.  She also had a brain MRI which shows subtle asymmetric atrophic involving the mesial left temporal lobe and left hippocampus as compared to the right.  She reports since being discharged from the hospital she has not had any additional seizures.  She is compliant with her oxcarbazepine 150 mg twice a day, sometimes she forgets to take the medication early morning but she will take it in the early afternoon.  Denies any additional breakthrough seizures but report nightly headaches.  She is taking Tylenol and ibuprofen for headaches.  Currently she does not have any complaint.  We discussed about pregnancy and she says she is not using any birth control pills and is considering getting pregnant.  When grandmother attempting to provide additional history, patient got upset and left the room.  Hospital discharge summary below  Patient presented to the ED after having witnessed seizure in her PCP clinic.  She has a longstanding history of seizures.  She had been previously well controlled on Depakote, however started Lamictal approximately 1 month ago as she desired to become pregnant and has continued to have breakthrough  seizures.  Additionally Lamictal has caused significant side effects which make it difficult for her to take her medicine. Urine drug screen, ethanol and pregnancy test were negative.  Work-up including her CBC, glucose, TSH, electrolytes noncontributory.  Patient was placed Keppra per neurology, and given as needed Ativan for seizures lasting greater than 5 minutes.  Patient's first overnight EEG did not show any seizure activity, despite patient having symptoms.  Patient became very agitated during her first night of admission, experiencing auditory and visual hallucinations, responding to internal stimuli.  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  Neurology discontinued Keppra due to worsening psychosis profile, and switched to oxcarbazepine.  With her continued disoriented state and negative EEG neurology ordered MRI which showed subtle asymmetric atrophy involving the mesial left temporal lobe and left hippocampus as compared to the right, without any acute intracranial abnormality identified.  This was followed up by another overnight EEG which was within normal limits.  She was placed on 24-hour video surveillance, to better characterize seizure activity episodes, which was normal.  Likely caused by side effects of lamictal. Patient tolerated oxcarbazepine well and returned to baseline cognition and functioning on 9/9. Neurology outpatient follow up scheduled.   Handedness: Right   Seizure Type: ?unclear, described as generalized tonic clonic   Current frequency: 2 to 3 per months but improved since Oxcarbazepine was started   Any injuries from seizures: None reported   Seizure risk factors: Family history of seizures   Previous ASMs: Valproic acid, Levetiracetam, Lamotrigine and Oxcarbazepine   Currenty ASMs: Oxcarbazepine 150mg  BID   ASMs side effects: headaches   Brain  Images: Subtle asymmetric atrophy involving the mesial left temporal lobe and left hippocampus as compared to the right  Previous EEGs: Normal    OTHER MEDICAL CONDITIONS: Anxiety, depression, memory problems   REVIEW OF SYSTEMS: Full 14 system review of systems performed and negative with exception of: as noted in the HPI  ALLERGIES: Allergies  Allergen Reactions   Lamotrigine     Dizziness, Hallucinations   Codeine    Keppra [Levetiracetam]     psychosis   Latex    Tdap [Tetanus-Diphth-Acell Pertussis]     HOME MEDICATIONS: Outpatient Medications Prior to Visit  Medication Sig Dispense Refill   OXcarbazepine (TRILEPTAL) 150 MG tablet TAKE 1 TABLET BY MOUTH TWICE A DAY 180 tablet 5   No facility-administered medications prior to visit.    PAST MEDICAL HISTORY: Past Medical History:  Diagnosis Date   Seizure (HCC)    Seizures (HCC)    Well child check 04/26/2011    PAST SURGICAL HISTORY: Past Surgical History:  Procedure Laterality Date   LEG SURGERY      FAMILY HISTORY: Family History  Problem Relation Age of Onset   Healthy Mother    Seizures Father    Seizures Sister     SOCIAL HISTORY: Social History   Socioeconomic History   Marital status: Single    Spouse name: Not on file   Number of children: Not on file   Years of education: Not on file  Highest education level: Not on file  Occupational History   Not on file  Tobacco Use   Smoking status: Never   Smokeless tobacco: Never  Substance and Sexual Activity   Alcohol use: No   Drug use: No   Sexual activity: Yes    Birth control/protection: None  Other Topics Concern   Not on file  Social History Narrative   Lives at home between mother's house and grandmother's house    Social Determinants of Health   Financial Resource Strain: Not on file  Food Insecurity: Not on file  Transportation Needs: Not on file  Physical Activity: Not on file  Stress: Not on file  Social Connections: Not  on file  Intimate Partner Violence: Not on file     PHYSICAL EXAM  GENERAL EXAM/CONSTITUTIONAL: Vitals:  Vitals:   05/15/22 1013  BP: 124/84  Pulse: 100  Weight: 112 lb (50.8 kg)  Height: 5\' 3"  (1.6 m)   Body mass index is 19.84 kg/m. Wt Readings from Last 3 Encounters:  05/15/22 112 lb (50.8 kg)  03/26/22 114 lb (51.7 kg)  12/01/21 120 lb (54.4 kg)   Patient is in no distress; well developed, nourished and groomed; neck is supple  MUSCULOSKELETAL: Gait, strength, tone, movements noted in Neurologic exam below  NEUROLOGIC: MENTAL STATUS:      No data to display         awake, alert, oriented to person, place and time recent and remote memory intact normal attention and concentration language fluent, comprehension intact, naming intact fund of knowledge appropriate   MOTOR:  normal bulk and tone, full strength in the BUE, BLE   GAIT/STATION:  normal   DIAGNOSTIC DATA (LABS, IMAGING, TESTING) - I reviewed patient records, labs, notes, testing and imaging myself where available.  Lab Results  Component Value Date   WBC 3.8 (L) 03/26/2022   HGB 12.8 03/26/2022   HCT 39.3 03/26/2022   MCV 89.1 03/26/2022   PLT 295 03/26/2022      Component Value Date/Time   NA 139 03/26/2022 1934   NA 141 06/02/2021 1137   K 3.8 03/26/2022 1934   CL 108 03/26/2022 1934   CO2 25 03/26/2022 1934   GLUCOSE 103 (H) 03/26/2022 1934   BUN 11 03/26/2022 1934   BUN 13 06/02/2021 1137   CREATININE 0.75 03/26/2022 1934   CREATININE 0.67 03/28/2016 0858   CALCIUM 9.4 03/26/2022 1934   PROT 7.2 05/11/2021 0700   ALBUMIN 3.9 05/11/2021 0700   AST 17 05/11/2021 0700   ALT 13 05/11/2021 0700   ALKPHOS 41 05/11/2021 0700   BILITOT 1.0 05/11/2021 0700   GFRNONAA >60 03/26/2022 1934   GFRNONAA SEE NOTE 03/28/2016 0858   GFRAA >60 11/21/2018 2104   GFRAA SEE NOTE 03/28/2016 0858   No results found for: "CHOL", "HDL", "LDLCALC", "LDLDIRECT", "TRIG" No results found for:  "HGBA1C" No results found for: "VITAMINB12" Lab Results  Component Value Date   TSH 1.234 05/10/2021   Oxcarbazepine level 06/02/2021            8  Oxcarbazepine level 03/26/2022:     7   Brain MRI  1. Subtle asymmetric atrophy involving the mesial left temporal lobe and left hippocampus as compared to the right. No convincing corresponding signal abnormality as is typically seen with mesial temporal sclerosis. Correlation with EEG recommended. 2. Otherwise normal brain MRI. No other acute intracranial abnormality identified.   EEG  Description: The posterior dominant rhythm consists of 8  Hz activity of moderate voltage (25-35 uV) seen predominantly in posterior head regions, symmetric and reactive to eye opening and eye closing. Hyperventilation and photic stimulation were not performed.      Of note, patient pulled out her electrodes around 5 AM on 05/11/2021 after which this study was not interpretable.   IMPRESSION: This study is within normal limits. No seizures or epileptiform discharges were seen throughout the recording.   I personally reviewed brain Images and previous EEG reports.    ASSESSMENT AND PLAN  21 y.o. year old female  with long history of seizure disorder who is presenting for follow-up for epilepsy.  Overall doing well on Oxcarbazepine but had 2 breakthrough seizure in June in the setting of high stress.  Her last level was 7.  At this time I would increase her oxcarbazepine to 300 mg twice daily.  I will see her in 6 months for follow-up but advised her and grandmother to contact me if she has a breakthrough seizure.  She voices understanding.    1. Seizure disorder [facility])      Patient Instructions  Increase Oxcarbazepine to 300 mg twice daily  Continue your other medications  Follow up in 6 months or sooner if worse     Per [facility] statutes, patients with seizures are not allowed to drive until they have been seizure-free for six months.   Other recommendations include using caution when using heavy equipment or power tools. Avoid working on ladders or at heights. Take showers instead of baths.  Do not swim alone.  Ensure the water temperature is not too high on the home water heater. Do not go swimming alone. Do not lock yourself in a room alone (i.e. bathroom). When caring for infants or small children, sit down when holding, feeding, or changing them to minimize risk of injury to the child in the event you have a seizure. Maintain good sleep hygiene. Avoid alcohol.  Also recommend adequate sleep, hydration, good diet and minimize stress.   During the Seizure  - First, ensure adequate ventilation and place patients on the floor on their left side  Loosen clothing around the neck and ensure the airway is patent. If the patient is clenching the teeth, do not force the mouth open with any object as this can cause severe damage - Remove all items from the surrounding that can be hazardous. The patient may be oblivious to what's happening and may not even know what he or she is doing. If the patient is confused and wandering, either gently guide him/her away and block access to outside areas - Reassure the individual and be comforting - Call 911. In most cases, the seizure ends before EMS arrives. However, there are cases when seizures may last over 3 to 5 minutes. Or the individual may have developed breathing difficulties or severe injuries. If a pregnant patient or a person with diabetes develops a seizure, it is prudent to call an ambulance. - Finally, if the patient does not regain full consciousness, then call EMS. Most patients will remain confused for about 45 to 90 minutes after a seizure, so you must use judgment in calling for help. - Avoid restraints but make sure the patient is in a bed with padded side rails - Place the individual in a lateral position with the neck slightly flexed; this will help the saliva drain from the  mouth and prevent the tongue from falling backward - Remove all nearby furniture and other hazards  from the area - Provide verbal assurance as the individual is regaining consciousness - Provide the patient with privacy if possible - Call for help and start treatment as ordered by the caregiver   After the Seizure (Postictal Stage)  After a seizure, most patients experience confusion, fatigue, muscle pain and/or a headache. Thus, one should permit the individual to sleep. For the next few days, reassurance is essential. Being calm and helping reorient the person is also of importance.  Most seizures are painless and end spontaneously. Seizures are not harmful to others but can lead to complications such as stress on the lungs, brain and the heart. Individuals with prior lung problems may develop labored breathing and respiratory distress.     No orders of the defined types were placed in this encounter.    Meds ordered this encounter  Medications   OXcarbazepine (TRILEPTAL) 300 MG tablet    Sig: Take 1 tablet (300 mg total) by mouth 2 (two) times daily.    Dispense:  180 tablet    Refill:  3     Return in about 6 months (around 11/13/2022).    Windell [place], MD 05/15/2022, 10:41 AM  [facility] [address] [place], [place] [address] [phone]

## 2022-05-22 DIAGNOSIS — F4310 Post-traumatic stress disorder, unspecified: Secondary | ICD-10-CM | POA: Diagnosis not present

## 2022-05-25 IMAGING — CR DG ANKLE COMPLETE 3+V*R*
3 series · 3 of 3 positions shown · non-contrast
Comparison: 07/26/2019

CLINICAL DATA: Right ankle pain after injury

EXAM:
RIGHT ANKLE - COMPLETE 3+ VIEW

[ankle ap]
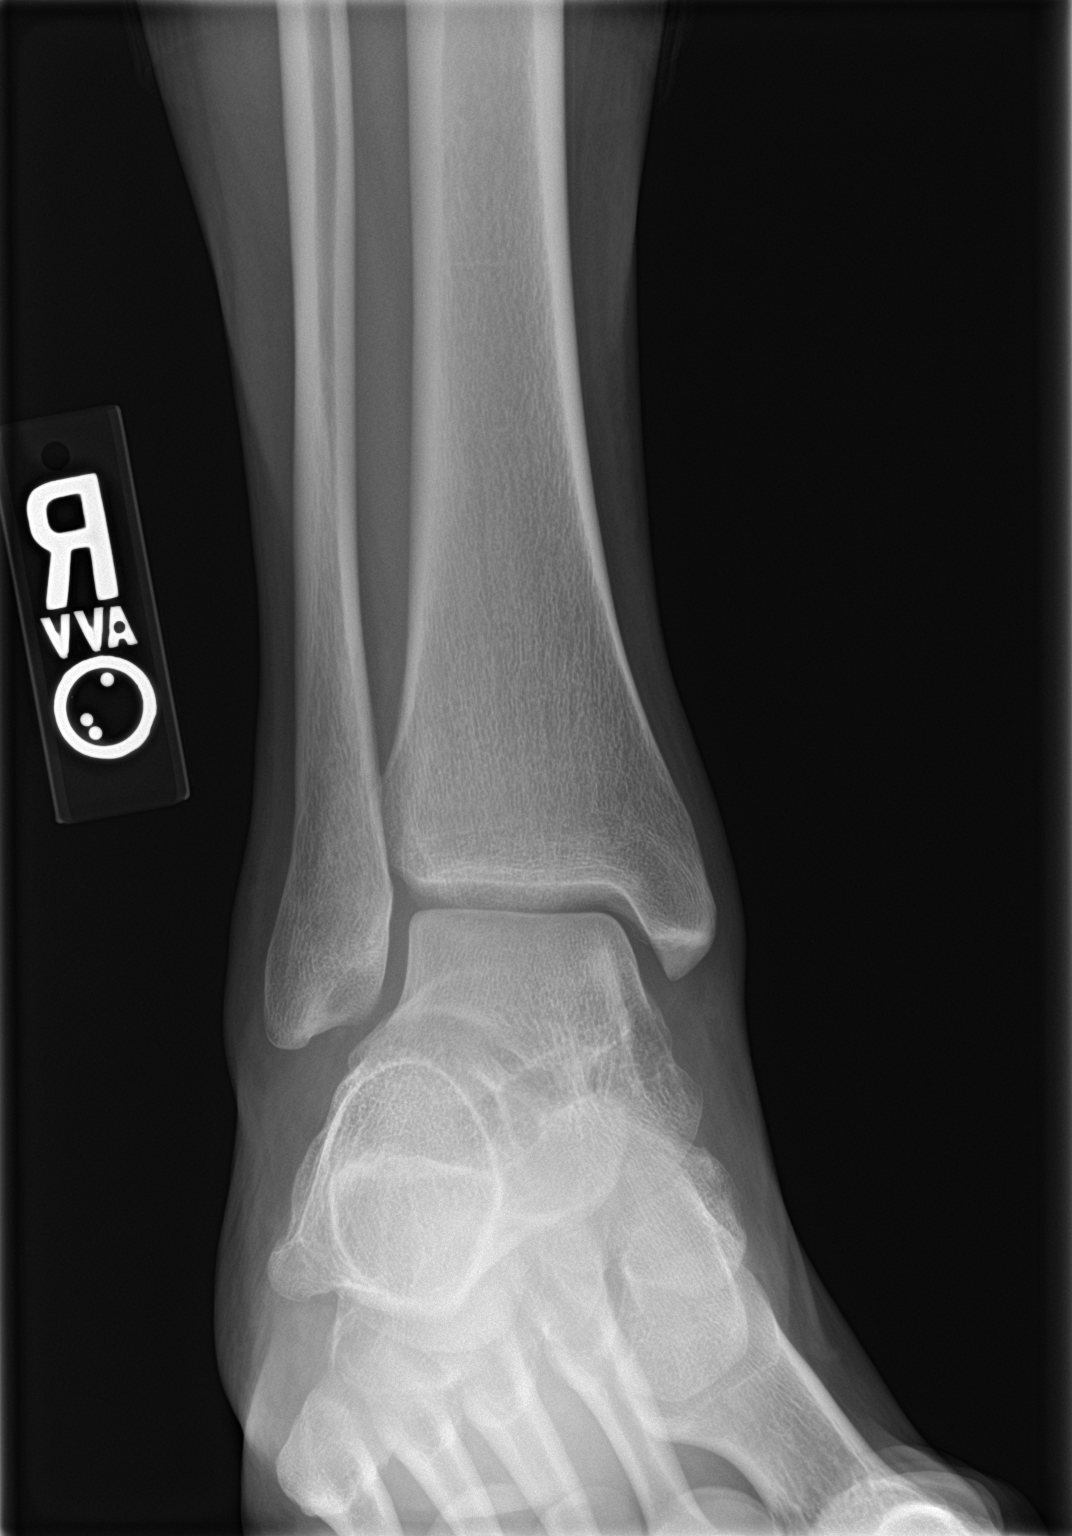

[ankle obl]
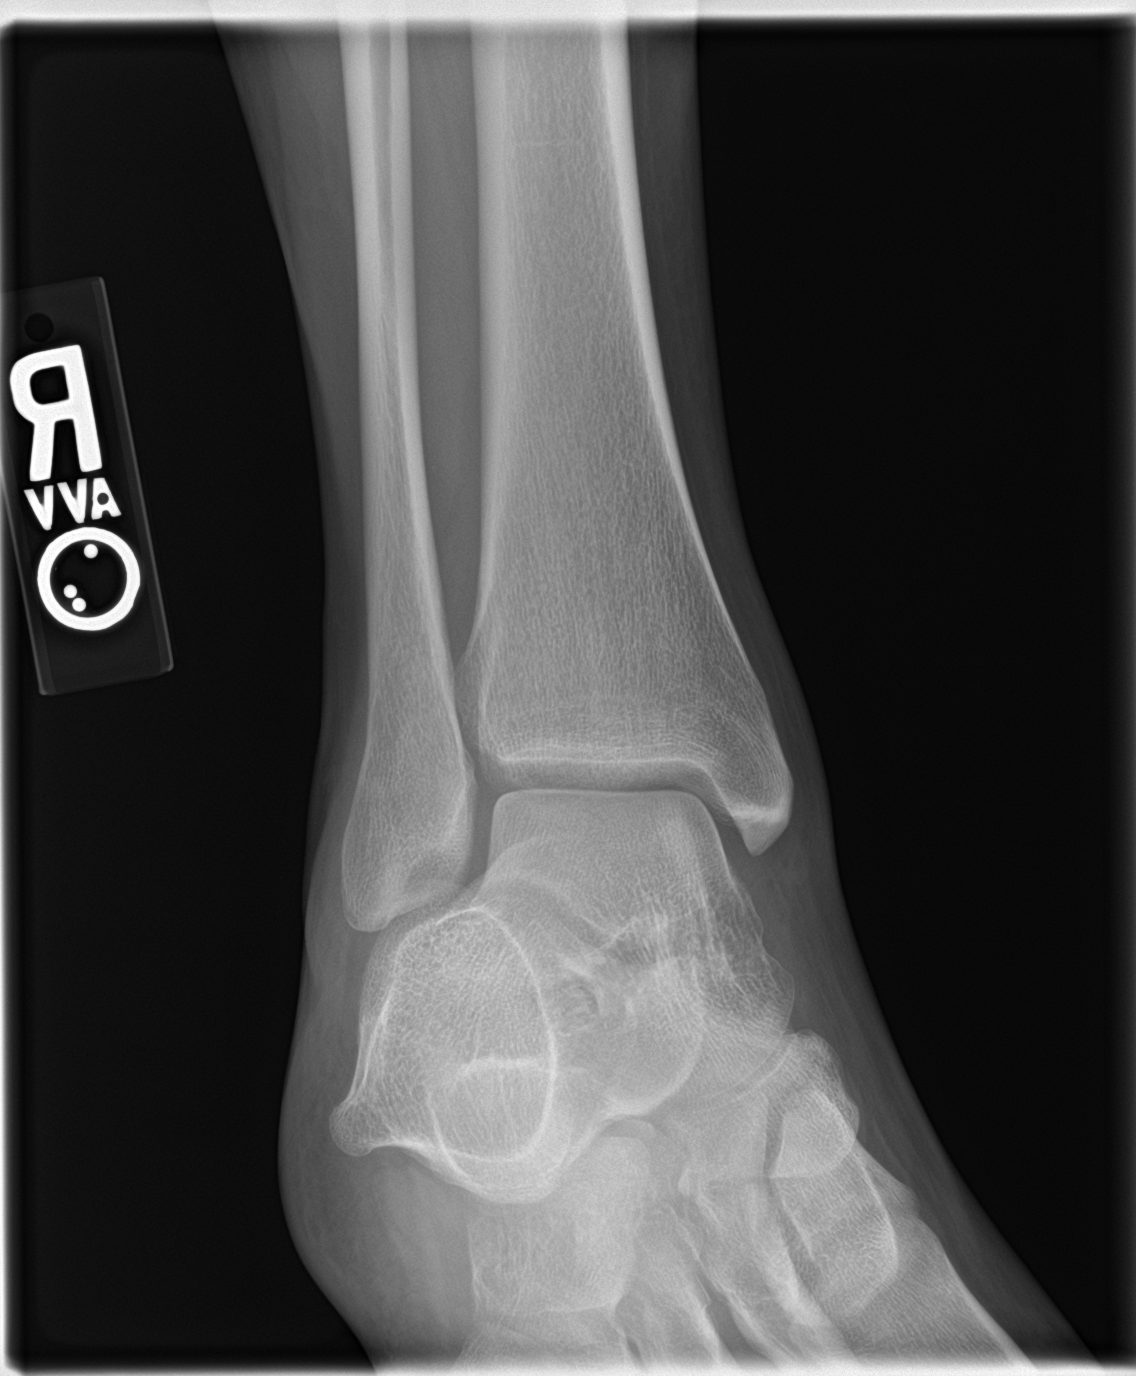

[ankle lat]
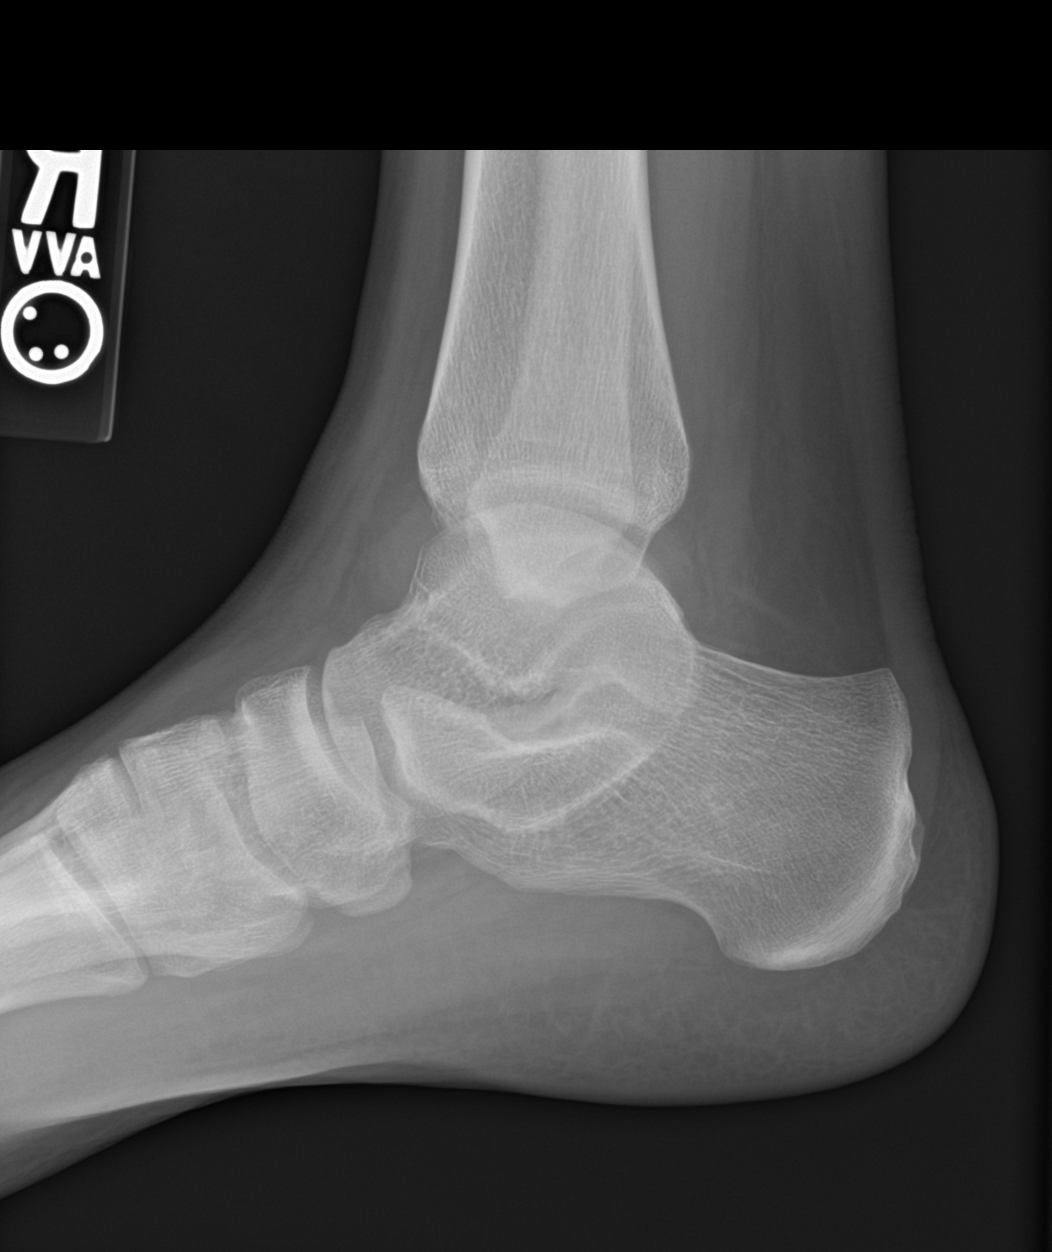

[3 of 3 positions shown; findings below may reference images not displayed]

FINDINGS: There is no evidence of acute fracture or dislocation. Possible
small tibiotalar joint effusion. There is no evidence of arthropathy
or other focal bone abnormality. Soft tissues are unremarkable.
IMPRESSION: 1. No acute fracture or dislocation of the right ankle.
2. Possible small tibiotalar joint effusion.

## 2022-06-13 ENCOUNTER — Ambulatory Visit
Admission: EM | Admit: 2022-06-13 | Discharge: 2022-06-13 | Disposition: A | Discharge status: Home / Self Care | Payer: Medicaid Other | Attending: Physician Assistant | Admitting: Physician Assistant

## 2022-06-13 DIAGNOSIS — R051 Acute cough: Secondary | ICD-10-CM | POA: Diagnosis not present

## 2022-06-13 DIAGNOSIS — J069 Acute upper respiratory infection, unspecified: Secondary | ICD-10-CM

## 2022-06-13 MED ORDER — FLUTICASONE PROPIONATE 50 MCG/ACT NA SUSP: 2.0000 | Freq: Every day | NASAL | 2 refills | Status: DC

## 2022-06-13 MED ORDER — DM-GUAIFENESIN ER 30-600 MG PO TB12: 1.0000 | ORAL_TABLET | Freq: Two times a day (BID) | ORAL | 0 refills | Status: DC

## 2022-06-13 NOTE — ED Provider Notes (Signed)
[name]    CSN: [id number] Arrival date & time: 06/13/22  1611      History   Chief Complaint Chief Complaint  Patient presents with   Cough    HPI [name] is a 21 y.o. female.   21 year old female presents with right ear pain, cough and congestion.  Patient indicates that yesterday she started with sinus congestion, postnasal drip and rhinitis.  He also relates she is having some right ear pain and pressure with upper respiratory symptoms along with mild intermittent sore throat and painful swallowing.  She indicates that she has had some cough and chest congestion with production being green tinted.  She denies having fever, chills, shortness of breath, or wheezing.  She relates that she has not been around any family or friends that have been sick.  Indicates she has not been taking any OTC medications to try and relieve her symptoms.   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      Home Medications    Prior to Admission medications   Medication Sig Start Date End Date Taking? Authorizing Provider  dextromethorphan-guaiFENesin (MUCINEX DM) 30-600 MG 12hr tablet Take 1 tablet by mouth 2 (two) times daily. 06/13/22  Yes Ellsworth Lennox, [name]  fluticasone [facility]) 50 MCG/ACT nasal  spray Place 2 sprays into both nostrils daily. 06/13/22  Yes Ellsworth Lennox, [name]  OXcarbazepine (TRILEPTAL) 300 MG tablet Take 1 tablet (300 mg total) by mouth 2 (two) times daily. 05/15/22 05/10/23  Windell [place], MD  loratadine (CLARITIN) 10 MG tablet Take 1 tablet (10 mg total) by mouth daily. 10/15/18 01/28/21  Nestor Ramp, MD    Family History Family History  Problem Relation Age of Onset   Healthy Mother    Seizures Father    Seizures Sister     Social History Social History   Tobacco Use   Smoking status: Never   Smokeless tobacco: Never  Substance Use Topics   Alcohol use: No   Drug use: No     Allergies   Lamotrigine, Codeine, Keppra [levetiracetam], Latex, and Tdap [tetanus-diphth-acell pertussis]   Review of Systems Review of Systems  HENT:  Positive for ear pain (right) and sinus pressure.   Respiratory:  Positive for cough.      Physical Exam Triage Vital Signs ED Triage Vitals  Enc Vitals Group     BP 06/13/22 1625 107/70     Pulse Rate 06/13/22 1625 89     Resp 06/13/22 1625 16     Temp 06/13/22 1625 98.5 F (36.9 C)     Temp Source 06/13/22 1625 Oral     SpO2 06/13/22 1625 98 %     Weight --  Height --      Head Circumference --      Peak Flow --      Pain Score 06/13/22 1626 10     Pain Loc --      Pain Edu? --      Excl. in GC? --    No data found.  Updated Vital Signs BP 107/70 (BP Location: Left Arm)   Pulse 89   Temp 98.5 F (36.9 C) (Oral)   Resp 16   SpO2 98%   Visual Acuity Right Eye Distance:   Left Eye Distance:   Bilateral Distance:    Right Eye Near:   Left Eye Near:    Bilateral Near:     Physical Exam Constitutional:      Appearance: Normal appearance.  HENT:     Right Ear: Ear canal normal. Tympanic membrane is injected.     Left Ear: Tympanic membrane and ear canal normal.     Mouth/Throat:     Mouth: Mucous membranes are moist.     Pharynx: Oropharynx is clear. No posterior oropharyngeal erythema.   Cardiovascular:     Rate and Rhythm: Normal rate and regular rhythm.     Heart sounds: Normal heart sounds.  Pulmonary:     Effort: Pulmonary effort is normal.     Breath sounds: Normal breath sounds and air entry. No wheezing, rhonchi or rales.  Lymphadenopathy:     Cervical: No cervical adenopathy.  Neurological:     Mental Status: She is alert.      UC Treatments / Results  Labs (all labs ordered are listed, but only abnormal results are displayed) Labs Reviewed - No data to display  EKG   Radiology No results found.  Procedures Procedures (including critical care time)  Medications Ordered in UC Medications - No data to display  Initial Impression / Assessment and Plan / UC Course  I have reviewed the triage vital signs and the nursing notes.  Pertinent labs & imaging results that were available during my care of the patient were reviewed by me and considered in my medical decision making (see chart for details).    Plan: 1.  The upper respiratory congestion will be treated with the following: A.  Flonase nasal spray, 2 sprays each nostril once daily to help decrease the sinus congestion and ear congestion. B.  Mucinex DM every 12 hours to control chest congestion. 2.  Acute cough will be treated with the following: A.  Mucinex DM every 12 hours to help control cough and congestion. 3.  Advised take Tylenol or ibuprofen for fever, aches or pains as needed. 4.  Advised to follow-up with PCP or return to urgent care if symptoms fail to improve. Final Clinical Impressions(s) / UC Diagnoses   Final diagnoses:  Acute upper respiratory infection  Acute cough     Discharge Instructions      Advised use of Flonase nasal spray, 2 sprays each nostril once a day to help decrease the upper respiratory sinus congestion and ear pressure. Advised to take the Mucinex DM every 12 hours to help control cough and congestion. Advised take ibuprofen or Tylenol as needed for  fever aches or pains. Advised to follow-up with PCP return to urgent care if symptoms fail to improve.    ED Prescriptions     Medication Sig Dispense Auth. Provider   dextromethorphan-guaiFENesin (MUCINEX DM) 30-600 MG 12hr tablet Take 1 tablet by mouth 2 (two) times daily. 20 tablet Ellsworth Lennox,  [name]   fluticasone (FLONASE) 50 MCG/ACT nasal spray Place 2 sprays into both nostrils daily. 11.1 g Nyoka Lint, [name]      PDMP not reviewed this encounter.   Nyoka Lint, [name] 06/13/22 1639

## 2022-06-13 NOTE — ED Triage Notes (Signed)
Pt c/o cough, right ear ache, sore throat, body aches, onset ~ yesterday

## 2022-06-13 NOTE — Discharge Instructions (Addendum)
Advised use of Flonase nasal spray, 2 sprays each nostril once a day to help decrease the upper respiratory sinus congestion and ear pressure. Advised to take the Mucinex DM every 12 hours to help control cough and congestion. Advised take ibuprofen or Tylenol as needed for fever aches or pains. Advised to follow-up with PCP return to urgent care if symptoms fail to improve.

## 2022-06-22 DIAGNOSIS — F4310 Post-traumatic stress disorder, unspecified: Secondary | ICD-10-CM | POA: Diagnosis not present

## 2022-07-16 DIAGNOSIS — F4310 Post-traumatic stress disorder, unspecified: Secondary | ICD-10-CM | POA: Diagnosis not present

## 2022-07-26 ENCOUNTER — Other Ambulatory Visit: Payer: Self-pay

## 2022-07-26 ENCOUNTER — Emergency Department (HOSPITAL_COMMUNITY): Payer: Medicaid Other

## 2022-07-26 ENCOUNTER — Emergency Department (HOSPITAL_COMMUNITY)
Admission: EM | Admit: 2022-07-26 | Discharge: 2022-07-26 | Disposition: A | Discharge status: Home / Self Care | Payer: Medicaid Other | Attending: Emergency Medicine | Admitting: Emergency Medicine

## 2022-07-26 DIAGNOSIS — Z91040 Latex allergy status: Secondary | ICD-10-CM | POA: Insufficient documentation

## 2022-07-26 DIAGNOSIS — R000 Tachycardia, unspecified: Secondary | ICD-10-CM | POA: Insufficient documentation

## 2022-07-26 DIAGNOSIS — I959 Hypotension, unspecified: Secondary | ICD-10-CM | POA: Diagnosis not present

## 2022-07-26 DIAGNOSIS — J069 Acute upper respiratory infection, unspecified: Secondary | ICD-10-CM | POA: Insufficient documentation

## 2022-07-26 DIAGNOSIS — Z1152 Encounter for screening for COVID-19: Secondary | ICD-10-CM | POA: Diagnosis not present

## 2022-07-26 DIAGNOSIS — R569 Unspecified convulsions: Secondary | ICD-10-CM | POA: Diagnosis not present

## 2022-07-26 DIAGNOSIS — E876 Hypokalemia: Secondary | ICD-10-CM | POA: Insufficient documentation

## 2022-07-26 DIAGNOSIS — R059 Cough, unspecified: Secondary | ICD-10-CM | POA: Diagnosis not present

## 2022-07-26 DIAGNOSIS — R0689 Other abnormalities of breathing: Secondary | ICD-10-CM | POA: Diagnosis not present

## 2022-07-26 LAB — CBC WITH DIFFERENTIAL/PLATELET
Abs Immature Granulocytes: 0.01 10*3/uL (ref 0.00–0.07)
Basophils Absolute: 0 10*3/uL (ref 0.0–0.1)
Basophils Relative: 1 %
Eosinophils Absolute: 0.1 10*3/uL (ref 0.0–0.5)
Eosinophils Relative: 1 %
HCT: 38.7 % (ref 36.0–46.0)
Hemoglobin: 12.3 g/dL (ref 12.0–15.0)
Immature Granulocytes: 0 %
Lymphocytes Relative: 25 %
Lymphs Abs: 1.1 10*3/uL (ref 0.7–4.0)
MCH: 28.9 pg (ref 26.0–34.0)
MCHC: 31.8 g/dL (ref 30.0–36.0)
MCV: 91.1 fL (ref 80.0–100.0)
Monocytes Absolute: 0.2 10*3/uL (ref 0.1–1.0)
Monocytes Relative: 5 %
Neutro Abs: 2.9 10*3/uL (ref 1.7–7.7)
Neutrophils Relative %: 68 %
Platelets: 299 10*3/uL (ref 150–400)
RBC: 4.25 MIL/uL (ref 3.87–5.11)
RDW: 13.5 % (ref 11.5–15.5)
WBC: 4.3 10*3/uL (ref 4.0–10.5)
nRBC: 0 % (ref 0.0–0.2)

## 2022-07-26 LAB — COMPREHENSIVE METABOLIC PANEL
ALT: 7 U/L (ref 0–44)
AST: 10 U/L — ABNORMAL LOW (ref 15–41)
Albumin: 3.3 g/dL — ABNORMAL LOW (ref 3.5–5.0)
Alkaline Phosphatase: 33 U/L — ABNORMAL LOW (ref 38–126)
Anion gap: 5 (ref 5–15)
BUN: 11 mg/dL (ref 6–20)
CO2: 21 mmol/L — ABNORMAL LOW (ref 22–32)
Calcium: 7.5 mg/dL — ABNORMAL LOW (ref 8.9–10.3)
Chloride: 116 mmol/L — ABNORMAL HIGH (ref 98–111)
Creatinine, Ser: 0.62 mg/dL (ref 0.44–1.00)
GFR, Estimated: >60 mL/min (ref >60)
Glucose, Bld: 108 mg/dL — ABNORMAL HIGH (ref 70–99)
Potassium: 3.1 mmol/L — ABNORMAL LOW (ref 3.5–5.1)
Sodium: 142 mmol/L (ref 135–145)
Total Bilirubin: 0.3 mg/dL (ref 0.3–1.2)
Total Protein: 6 g/dL — ABNORMAL LOW (ref 6.5–8.1)

## 2022-07-26 LAB — RESP PANEL BY RT-PCR (FLU A&B, COVID) ARPGX2
Influenza A by PCR: NEGATIVE
Influenza B by PCR: NEGATIVE
SARS Coronavirus 2 by RT PCR: NEGATIVE

## 2022-07-26 LAB — I-STAT BETA HCG BLOOD, ED (MC, WL, AP ONLY): I-stat hCG, quantitative: <5 m[IU]/mL (ref <5)

## 2022-07-26 LAB — URINALYSIS, ROUTINE W REFLEX MICROSCOPIC
Bilirubin Urine: NEGATIVE
Glucose, UA: NEGATIVE mg/dL
Hgb urine dipstick: NEGATIVE
Ketones, ur: NEGATIVE mg/dL
Leukocytes,Ua: NEGATIVE
Nitrite: NEGATIVE
Protein, ur: NEGATIVE mg/dL
Specific Gravity, Urine: 1.029 (ref 1.005–1.030)
pH: 5 (ref 5.0–8.0)

## 2022-07-26 MED ORDER — SODIUM CHLORIDE 0.9 % IV BOLUS
1000.0000 mL | Freq: Once | INTRAVENOUS | Status: AC
  Administered 2022-07-26: 1000 mL via INTRAVENOUS

## 2022-07-26 MED ORDER — POTASSIUM CHLORIDE CRYS ER 20 MEQ PO TBCR
40.0000 meq | EXTENDED_RELEASE_TABLET | Freq: Once | ORAL | Status: AC
  Administered 2022-07-26: 40 meq via ORAL
  Filled 2022-07-26: qty 2

## 2022-07-26 NOTE — Discharge Instructions (Signed)
Your history, exam, work-up today was overall reassuring.  I suspect your dehydrated in the setting of a viral upper respiratory infection.  Your COVID and flu test were negative and the x-ray did not show pneumonia.  Fluids improved your heart rate and we did find the low potassium which we supplemented for you.  Please increase your calcium in your diet and rest and stay hydrated.  Please call your neurologist to discuss medication changes.  Please rest.  If any symptoms change or worsen acutely, please return to the nearest emergency department.

## 2022-07-26 NOTE — ED Triage Notes (Signed)
Pt arrives from home via home [facility], called out for seizure activity. Family reported seizure activity about 10 minutes prior to arrival of EMS. On EMS arrival, she was shaking all over, pt mentation improved. Pt had another seizure activity lasting about 2.5 minutes. She was given 2.5mg  Midazolam x 2 doses en route. 106/58, hr 110, cbg 127. Pt reported that she does have a history of stress seizures. Pt was c/o pain with the IV and the IV was removed prior to arrival by EMS.

## 2022-07-26 NOTE — ED Provider Notes (Signed)
[facility]-EMERGENCY DEPT Provider Note   CSN: [id number] Arrival date & time: 07/26/22  2027     History  Chief Complaint  Patient presents with   Seizures    [name] is a 21 y.o. female.  The history is provided by the patient, medical records and the EMS personnel (ems report to nursing). No language interpreter was used.  Seizures Seizure activity on arrival: no   Seizure type:  Grand mal Preceding symptoms comment:  "stress and feeling hot" Initial focality:  None Episode characteristics: abnormal movements, generalized shaking and unresponsiveness   Return to baseline: yes   Number of seizures this episode:  2 Progression:  Unchanged Recent head injury:  No recent head injuries PTA treatment:  Midazolam History of seizures: yes   Similar to previous episodes: yes   Severity:  Moderate Seizure control level:  Uncontrolled Current therapy:  Oxcarbazepine Compliance with current therapy:  Good      Home Medications Prior to Admission medications   Medication Sig Start Date End Date Taking? Authorizing Provider  dextromethorphan-guaiFENesin (MUCINEX DM) 30-600 MG 12hr tablet Take 1 tablet by mouth 2 (two) times daily. 06/13/22   Ellsworth Lennox, [name]  fluticasone [facility]) 50 MCG/ACT nasal spray Place 2 sprays into both nostrils daily. 06/13/22   Ellsworth Lennox, [name]  OXcarbazepine (TRILEPTAL) 300 MG tablet Take 1 tablet (300 mg total) by mouth 2 (two) times daily. 05/15/22 05/10/23  Windell Norfolk, MD  loratadine (CLARITIN) 10 MG tablet Take 1 tablet (10 mg total) by mouth daily. 10/15/18 01/28/21  Nestor Ramp, MD      Allergies    Lamotrigine, Codeine, Keppra [levetiracetam], Latex, and Tdap [tetanus-diphth-acell pertussis]    Review of Systems   Review of Systems  Constitutional:  Negative for chills, fatigue and fever.  HENT:  Negative for congestion.   Eyes:  Negative for visual disturbance.  Respiratory:  Positive for cough. Negative  for chest tightness, shortness of breath and wheezing.   Cardiovascular:  Negative for chest pain and palpitations.  Gastrointestinal:  Negative for abdominal pain, constipation, diarrhea, nausea and vomiting.  Genitourinary:  Negative for dysuria, flank pain and frequency.  Musculoskeletal:  Negative for back pain, neck pain and neck stiffness.  Skin:  Negative for rash and wound.  Neurological:  Positive for seizures. Negative for dizziness, weakness, light-headedness, numbness and headaches.  Psychiatric/Behavioral:  Negative for agitation and confusion.   All other systems reviewed and are negative.   Physical Exam Updated Vital Signs BP 113/76   Pulse (!) 108   Temp 98.5 F (36.9 C) (Oral)   Resp (!) 27   LMP 06/25/2022   SpO2 91%  Physical Exam Vitals and nursing note reviewed.  Constitutional:      General: She is not in acute distress.    Appearance: She is well-developed. She is not ill-appearing, toxic-appearing or diaphoretic.  HENT:     Head: Normocephalic and atraumatic.     Nose: No congestion or rhinorrhea.     Mouth/Throat:     Mouth: Mucous membranes are moist.     Pharynx: No oropharyngeal exudate or posterior oropharyngeal erythema.  Eyes:     Extraocular Movements: Extraocular movements intact.     Conjunctiva/sclera: Conjunctivae normal.     Pupils: Pupils are equal, round, and reactive to light.  Cardiovascular:     Rate and Rhythm: Regular rhythm. Tachycardia present.     Heart sounds: No murmur heard. Pulmonary:     Effort: Pulmonary effort  is normal. No respiratory distress.     Breath sounds: Normal breath sounds. No wheezing, rhonchi or rales.  Chest:     Chest wall: No tenderness.  Abdominal:     General: Abdomen is flat.     Palpations: Abdomen is soft.     Tenderness: There is no abdominal tenderness. There is no right CVA tenderness, left CVA tenderness, guarding or rebound.  Musculoskeletal:        General: No swelling or tenderness.      Cervical back: Neck supple. No tenderness.     Right lower leg: No edema.     Left lower leg: No edema.  Skin:    General: Skin is warm and dry.     Capillary Refill: Capillary refill takes less than 2 seconds.     Findings: No erythema or rash.  Neurological:     General: No focal deficit present.     Mental Status: She is alert. Mental status is at baseline.     Sensory: No sensory deficit.     Motor: No weakness.  Psychiatric:        Mood and Affect: Mood normal.     ED Results / Procedures / Treatments   Labs (all labs ordered are listed, but only abnormal results are displayed) Labs Reviewed  COMPREHENSIVE METABOLIC PANEL - Abnormal; Notable for the following components:      Result Value   Potassium 3.1 (*)    Chloride 116 (*)    CO2 21 (*)    Glucose, Bld 108 (*)    Calcium 7.5 (*)    Total Protein 6.0 (*)    Albumin 3.3 (*)    AST 10 (*)    Alkaline Phosphatase 33 (*)    All other components within normal limits  RESP PANEL BY RT-PCR (FLU A&B, COVID) ARPGX2  CBC WITH DIFFERENTIAL/PLATELET  URINALYSIS, ROUTINE W REFLEX MICROSCOPIC  I-STAT BETA HCG BLOOD, ED ([facility], [facility], [facility] ONLY)    EKG EKG Interpretation  Date/Time:  Thursday July 26 2022 21:06:46 EST Ventricular Rate:  116 PR Interval:  113 QRS Duration: 94 QT Interval:  316 QTC Calculation: 439 R Axis:   70 Text Interpretation: Sinus tachycardia RSR' in V1 or V2, probably normal variant LVH by voltage Borderline T abnormalities, inferior leads when compared to prior, similar appeance and slightly faster rate. No STEMI Confirmed by Theda Belfastegeler, Chris ([id number]) on 07/26/2022 9:15:01 PM  Radiology DG Chest Portable 1 View  Result Date: 07/26/2022 CLINICAL DATA:  Cough, seizure EXAM: PORTABLE CHEST 1 VIEW COMPARISON:  01/28/2021 FINDINGS: The heart size and mediastinal contours are within normal limits. Both lungs are clear. The visualized skeletal structures are unremarkable. IMPRESSION: Negative.  Electronically Signed   By: Charlett NoseKevin  Dover M.D.   On: 07/26/2022 21:20    Procedures Procedures    Medications Ordered in ED Medications  potassium chloride SA (KLOR-CON M) CR tablet 40 mEq (has no administration in time range)  sodium chloride 0.9 % bolus 1,000 mL (0 mLs Intravenous Stopped 07/26/22 2248)    ED Course/ Medical Decision Making/ A&P                           Medical Decision Making Amount and/or Complexity of Data Reviewed Labs: ordered. Radiology: ordered.  Risk Prescription drug management.    [name] is a 21 y.o. female with a past medical history significant for seizures, "stress seizures", and history of brief psychotic disorder  who presents with seizure.  According to patient, she has been "stressed about things" but did not elaborate what has focally been bothering her.  She reports that she was feeling hot while at her family house for Thanksgiving and warned them that it was too hot and she was again had a seizure.  She then had several minutes of generalized shaking consistent with her reported previous seizures.  She ended up having 2 minutes of shaking with waxing and waning mental status with EMS and was given midazolam x2.  Patient says she feels more normal now.  She reports no headache, neck pain, neck symptoms.  Denies fevers or chills.  Denies any nausea, vomiting, constipation, diarrhea, or urinary changes.  She denies any pain at this time but is tachycardic on arrival.  She is not hypoxic.  Glucose reportedly normal per EMS report as were vital signs aside from the slight tachycardia.  She reportedly had an IV placed that was removed due to pain in the IV site.  On my exam, patient had intact sensation and strength in extremities.  Symmetric smile.  Clear speech.  Pupils are symmetric and reactive with normal extraocular movements.  Good pulses in extremities.  Neck head no tenderness.  Patient well-appearing with clear speech.  Patient denied  any acute trauma over the last few days and is denying headache, at this time, will hold on head imaging.  We will get screening labs to look for significant electrolyte abnormalities and we will give her some fluids for the tachycardia.  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  We will get a chest x-ray for a cough that she is developed over the last few days, check her for COVID, and get screening labs.  If work-up is reassuring, anticipate possible discharge home.  10:13 PM Patient reassessed.  Her COVID and flu test are negative.  Chest x-ray does not show pneumonia.  I suspect she may have a viral upper EXTR infection otherwise that led to the coughing and some dehydration.  She still waiting on getting the fluids and the labs.  If her work-up is reassuring and she is feeling better after the fluids, I do anticipate discharge home with outpatient PCP and neurology follow-up.  Family is in the room and agrees with this plan.  Anticipate reassessment after fluids, labs, and p.o. challenge.  Patient's labs showed mild hypocalcemia and hypokalemia.  We will supplement the potassium and she will improve her diet for the calcium.  She will call her neurologist tomorrow to discuss medication changes but both she and family agree that she was likely dehydrated and had a "stress seizure or nonepileptiform seizure in the setting of stress of the holidays and URI.  We agreed to hold on lumbar puncture or any more extensive work-up and imaging.  Family agrees.  Patient patient will follow-up and understood return precautions.  Patient discharged in good condition.        Final Clinical Impression(s) / ED Diagnoses Final diagnoses:  Seizure-like activity (HCC)  Viral upper respiratory tract infection  Hypokalemia  Hypocalcemia    Rx / DC Orders ED Discharge Orders     None        Clinical Impression: 1. Seizure-like activity (HCC)   2. Viral upper respiratory tract infection   3. Hypokalemia   4. Hypocalcemia     Disposition: Discharge  Condition: Good  I have discussed the results, Dx and Tx plan with the pt(& family if present). He/she/they expressed understanding and agree(s) with the plan. Discharge instructions discussed at great length. Strict return precautions discussed and pt &/or family have verbalized understanding of the instructions. No further questions at time of discharge.    New Prescriptions   No medications on file    Follow Up: your neurologist     [facility] [address] [id number] [place] [address] [phone]       Harald Quevedo, Canary Brim, MD 07/26/22 [phone]

## 2022-07-30 ENCOUNTER — Telehealth: Payer: Self-pay | Admitting: Neurology

## 2022-07-30 NOTE — Telephone Encounter (Signed)
At present, no sooner appt's available.   Will monitor the schedule.

## 2022-07-30 NOTE — Telephone Encounter (Signed)
Pt grandmother is calling. Stated pt has had four seizure on Thanksgiving Day and had to go to the hospital. Stated she feels medication is not work. She is requesting a appointment with Dr. Teresa Coombs.

## 2022-07-31 NOTE — Telephone Encounter (Signed)
Pt is calling. Stated she needs for Dr. Teresa Coombs  to change her medication because she feel its not working. Pt is requesting a call back from nurse.

## 2022-08-01 DIAGNOSIS — F4310 Post-traumatic stress disorder, unspecified: Secondary | ICD-10-CM | POA: Diagnosis not present

## 2022-08-02 ENCOUNTER — Other Ambulatory Visit: Payer: Self-pay | Admitting: Neurology

## 2022-08-06 ENCOUNTER — Telehealth: Payer: Self-pay | Admitting: Neurology

## 2022-08-06 MED ORDER — OXCARBAZEPINE 300 MG PO TABS: 300.0000 mg | ORAL_TABLET | Freq: Two times a day (BID) | ORAL | 3 refills | Status: DC

## 2022-08-06 NOTE — Telephone Encounter (Signed)
Pt grandmother is calling. Stated a PA is needed for medication OXcarbazepine (TRILEPTAL) 150 MG tablet.  Stated pt is out of medication and really needs this medication.

## 2022-08-06 NOTE — Telephone Encounter (Signed)
Pt's mother has called re: the :OXcarbazepine (TRILEPTAL) 300 MG tablet ,she states pt has borrowed medication and is to a point that she can no longer do that.  Pt's mother is asking that this medication be filled as quickly as possible for pt.

## 2022-08-06 NOTE — Telephone Encounter (Signed)
Called grandmother and informed her the Rx has been sent in. [name] was very grateful for the update.

## 2022-08-06 NOTE — Telephone Encounter (Signed)
I called pharmacy and spoke with Morrie Sheldon.  She sts PA is not needed just updated prescription for the Oxcarbazepine 300 mg bid.  I have sent, please call grandmother back and update RX has been sent. Thank you.

## 2022-08-16 DIAGNOSIS — F4310 Post-traumatic stress disorder, unspecified: Secondary | ICD-10-CM | POA: Diagnosis not present

## 2022-08-31 DIAGNOSIS — F4310 Post-traumatic stress disorder, unspecified: Secondary | ICD-10-CM | POA: Diagnosis not present

## 2022-09-17 ENCOUNTER — Ambulatory Visit: Payer: Medicaid Other | Admitting: Neurology

## 2022-09-17 ENCOUNTER — Encounter: Payer: Self-pay | Admitting: Neurology

## 2022-09-17 VITALS — BP 110/73 | HR 90 | Ht 63.0 in | Wt 112.0 lb

## 2022-09-17 DIAGNOSIS — G40219 Localization-related (focal) (partial) symptomatic epilepsy and epileptic syndromes with complex partial seizures, intractable, without status epilepticus: Secondary | ICD-10-CM | POA: Diagnosis not present

## 2022-09-17 DIAGNOSIS — Z5181 Encounter for therapeutic drug level monitoring: Secondary | ICD-10-CM | POA: Diagnosis not present

## 2022-09-17 NOTE — Progress Notes (Signed)
[facility]  PATIENT: [name] DOB: [birth year]  REFERRING CLINICIAN: Cora Collum, DO HISTORY FROM: Patient/Grandmother and chart review  REASON FOR VISIT: Seizures    HISTORICAL  CHIEF COMPLAINT:  Chief Complaint  Patient presents with   Follow-up    Rm 13, with grandmother  Last seizure 07/28/22    INTERVAL HISTORY 09/17/2022:  [name] presents today for follow-up, she is accompanied by her grandmother.  Last visit was in September.  At that time plan was to continue oxcarbazepine 300 mg twice daily.  She did have a breakthrough seizure on Thanksgiving.  She reported having a total of 4 seizures and this was in the setting of fever.  She was taken to the hospital.  She was not admitted.  Her Trileptal level was not checked.  Since discharge from the hospital she has continued on Trileptal 300 mg twice daily, denies any seizures.  She does report some trouble with sleep, usually will go to sleep around 6 AM and wake up around 2 or 3 PM.  She does report difficulty with keeping up with her medication.  Some days she will forget to take the medication or at least 1 dose of the medication.  Denies any side effect. She is still working to get her disability.    INTERVAL HISTORY 05/15/2022:  Patient presents today for follow-up, she is accompanied by her grandmother.  Overall she is doing well, she is compliant with the oxcarbazepine 150 mg twice daily but reported having 2 breakthrough seizures in the month of June.  She reported that it was stress related as she found her deceased cousin in his room, she had a seizure then and on the day of the funeral also she had another seizure.  Other than that she has been compliant with her medication.  She still continue to follow with behavioral health.  Her last oxcarbazepine level in July was low at 7.   INTERVAL HISTORY 10/17/2021:  Patient present today for follow-up, she is accompanied by her grandmother.  Denies any  seizures since last visit in November.  Denies any side effect from the medication, she is compliant with oxcarbazepine 150 mg twice daily.  Headaches are controlled also with gabapentin 300 mg at night.  Denies any additional complaints and no concerns.  Currently, she is applying for disability regarding her seizure disorder.   INTERVAL HISTORY 07/05/2021 Patient presented there for follow-up with mother grandmother, last visit was on September 30 at that time plan was to continue ox carb 150 twice daily.  She report compliance with medication.  No seizures since last visit and denies any side effect from the medication.  Overall she is doing well.  Her last oxcarb level was 8. doing well, compliant with the medications, no side effects from the medications. In terms of her headaches, she reported improvement, she is on gabapentin 300 mg nightly and also reported good sleep.  No current question or concern.   HISTORY OF PRESENT ILLNESS:  This is a 22 year old woman with past medical history of seizures, anxiety who is presenting after hospital discharge for breakthrough seizure.  Patient was admitted from September 7 September 10 after having a seizure at her outpatient visit.  During her admission, she was trialed on levetiracetam which caused acute psychosis, she was seen by psychiatry, levetiracetam discontinued and patient was started on oxcarbazepine with return to baseline mental status.  Hospital course listed below.  She had a video EEG which did not show  any seizure even though patient report of the leads.  She also had a brain MRI which shows subtle asymmetric atrophic involving the mesial left temporal lobe and left hippocampus as compared to the right.  She reports since being discharged from the hospital she has not had any additional seizures.  She is compliant with her oxcarbazepine 150 mg twice a day, sometimes she forgets to take the medication early morning but she will take it in the  early afternoon.  Denies any additional breakthrough seizures but report nightly headaches.  She is taking Tylenol and ibuprofen for headaches.  Currently she does not have any complaint.  We discussed about pregnancy and she says she is not using any birth control pills and is considering getting pregnant.  When grandmother attempting to provide additional history, patient got upset and left the room.     Hospital discharge summary below  Patient presented to the ED after having witnessed seizure in her PCP clinic.  She has a longstanding history of seizures.  She had been previously well controlled on Depakote, however started Lamictal approximately 1 month ago as she desired to become pregnant and has continued to have breakthrough seizures.  Additionally Lamictal has caused significant side effects which make it difficult for her to take her medicine. Urine drug screen, ethanol and pregnancy test were negative.  Work-up including her CBC, glucose, TSH, electrolytes noncontributory.  Patient was placed Keppra per neurology, and given as needed Ativan for seizures lasting greater than 5 minutes.  Patient's first overnight EEG did not show any seizure activity, despite patient having symptoms.  Patient became very agitated during her first night of admission, experiencing auditory and visual hallucinations, responding to internal stimuli.  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  Neurology discontinued Keppra due to worsening psychosis profile, and switched to oxcarbazepine.  With her continued disoriented state and negative EEG neurology ordered MRI which showed subtle asymmetric atrophy involving the mesial left temporal lobe and left hippocampus as compared to the right, without any acute intracranial abnormality identified.  This was followed up by another overnight EEG which was within normal  limits.  She was placed on 24-hour video surveillance, to better characterize seizure activity episodes, which was normal.  Likely caused by side effects of lamictal. Patient tolerated oxcarbazepine well and returned to baseline cognition and functioning on 9/9. Neurology outpatient follow up scheduled.   Handedness: Right   Seizure Type: ?unclear, described as generalized tonic clonic   Current frequency: 2 to 3 per months but improved since Oxcarbazepine was started   Any injuries from seizures: None reported   Seizure risk factors: Family history of seizures   Previous ASMs: Valproic acid, Levetiracetam, Lamotrigine and Oxcarbazepine   Currenty ASMs: Oxcarbazepine 300mg  BID   ASMs side effects: headaches   Brain Images: Subtle asymmetric atrophy involving the mesial left temporal lobe and left hippocampus as compared to the right  Previous EEGs: Normal    OTHER MEDICAL CONDITIONS: Anxiety, depression, memory problems   REVIEW OF SYSTEMS: Full 14 system review of systems performed and negative with exception of: as noted in the HPI  ALLERGIES: Allergies  Allergen Reactions   Lamotrigine     Dizziness, Hallucinations   Codeine    Keppra [Levetiracetam]     psychosis   Latex    Tdap [Tetanus-Diphth-Acell Pertussis]     HOME MEDICATIONS: Outpatient Medications Prior  to Visit  Medication Sig Dispense Refill   dextromethorphan-guaiFENesin (MUCINEX DM) 30-600 MG 12hr tablet Take 1 tablet by mouth 2 (two) times daily. 20 tablet 0   fluticasone (FLONASE) 50 MCG/ACT nasal spray Place 2 sprays into both nostrils daily. 11.1 g 2   OXcarbazepine (TRILEPTAL) 150 MG tablet Take 150 mg by mouth 2 (two) times daily.     Oxcarbazepine (TRILEPTAL) 300 MG tablet Take 1 tablet (300 mg total) by mouth 2 (two) times daily. 180 tablet 3   No facility-administered medications prior to visit.    PAST MEDICAL HISTORY: Past Medical History:  Diagnosis Date   Seizure ([place])    Seizures  ([place])    Well child check 04/26/2011    PAST SURGICAL HISTORY: Past Surgical History:  Procedure Laterality Date   LEG SURGERY      FAMILY HISTORY: Family History  Problem Relation Age of Onset   Healthy Mother    Seizures Father    Seizures Sister     SOCIAL HISTORY: Social History   Socioeconomic History   Marital status: Single    Spouse name: Not on file   Number of children: Not on file   Years of education: Not on file   Highest education level: Not on file  Occupational History   Not on file  Tobacco Use   Smoking status: Never   Smokeless tobacco: Never  Substance and Sexual Activity   Alcohol use: No   Drug use: No   Sexual activity: Yes    Birth control/protection: None  Other Topics Concern   Not on file  Social History Narrative   Lives at home between [employer] house and grandmother's house    Social Determinants of Health   Financial Resource Strain: Not on file  Food Insecurity: Not on file  Transportation Needs: Not on file  Physical Activity: Not on file  Stress: Not on file  Social Connections: Not on file  Intimate Partner Violence: Not on file     PHYSICAL EXAM  GENERAL EXAM/CONSTITUTIONAL: Vitals:  Vitals:   09/17/22 1456  BP: 110/73  Pulse: 90  Weight: 112 lb (50.8 kg)  Height: 5\' 3"  (1.6 m)   Body mass index is 19.84 kg/m. Wt Readings from Last 3 Encounters:  09/17/22 112 lb (50.8 kg)  05/15/22 112 lb (50.8 kg)  03/26/22 114 lb (51.7 kg)   Patient is in no distress; well developed, nourished and groomed; neck is supple  MUSCULOSKELETAL: Gait, strength, tone, movements noted in Neurologic exam below  NEUROLOGIC: MENTAL STATUS:      No data to display         awake, alert, oriented to person, place and time Difficulty with recent memory Difficulty with attention and concentration language fluent, comprehension intact, naming intact fund of knowledge appropriate   MOTOR:  normal bulk and tone, full strength  in the BUE, BLE   GAIT/STATION:  normal   DIAGNOSTIC DATA (LABS, IMAGING, TESTING) - I reviewed patient records, labs, notes, testing and imaging myself where available.  Lab Results  Component Value Date   WBC 4.3 07/26/2022   HGB 12.3 07/26/2022   HCT 38.7 07/26/2022   MCV 91.1 07/26/2022   PLT 299 07/26/2022      Component Value Date/Time   NA 142 07/26/2022 2200   NA 141 06/02/2021 1137   K 3.1 (L) 07/26/2022 2200   CL 116 (H) 07/26/2022 2200   CO2 21 (L) 07/26/2022 2200   GLUCOSE 108 (H) 07/26/2022 2200  BUN 11 07/26/2022 2200   BUN 13 06/02/2021 1137   CREATININE 0.62 07/26/2022 2200   CREATININE 0.67 03/28/2016 0858   CALCIUM 7.5 (L) 07/26/2022 2200   PROT 6.0 (L) 07/26/2022 2200   ALBUMIN 3.3 (L) 07/26/2022 2200   AST 10 (L) 07/26/2022 2200   ALT 7 07/26/2022 2200   ALKPHOS 33 (L) 07/26/2022 2200   BILITOT 0.3 07/26/2022 2200   GFRNONAA >60 07/26/2022 2200   GFRNONAA SEE NOTE 03/28/2016 0858   GFRAA >60 11/21/2018 2104   GFRAA SEE NOTE 03/28/2016 0858   No results found for: "CHOL", "HDL", "LDLCALC", "LDLDIRECT", "TRIG" No results found for: "HGBA1C" No results found for: "VITAMINB12" Lab Results  Component Value Date   TSH 1.234 05/10/2021   Oxcarbazepine level 06/02/2021            8  Oxcarbazepine level 03/26/2022:     7   Brain MRI  1. Subtle asymmetric atrophy involving the mesial left temporal lobe and left hippocampus as compared to the right. No convincing corresponding signal abnormality as is typically seen with mesial temporal sclerosis. Correlation with EEG recommended. 2. Otherwise normal brain MRI. No other acute intracranial abnormality identified.   EEG  Description: The posterior dominant rhythm consists of 8 Hz activity of moderate voltage (25-35 uV) seen predominantly in posterior head regions, symmetric and reactive to eye opening and eye closing. Hyperventilation and photic stimulation were not performed.      Of note, patient  pulled out her electrodes around 5 AM on 05/11/2021 after which this study was not interpretable.   IMPRESSION: This study is within normal limits. No seizures or epileptiform discharges were seen throughout the recording.   I personally reviewed brain Images and previous EEG reports.    ASSESSMENT AND PLAN  22 y.o. year old female  with long history of seizure disorder who is presenting for follow-up for epilepsy.  Overall doing well on Oxcarbazepine 300 mg BID but continues to have breakthrough seizure, last seizure on Thanksgiving.  I will check a Trileptal level today and continue patient on Trileptal 300 mg twice daily.  Advised her to contact me if she has a breakthrough seizure otherwise I will see her in 6 months.  I also provided a letter to patient explaining that her epilepsy is intractable, and has caused damages to the brain, asymmetry in the left mesial temporal region and hippocampus and likely she cannot hold a job.    1. Partial symptomatic epilepsy with complex partial seizures, intractable, without status epilepticus (HCC)   2. Therapeutic drug monitoring      Patient Instructions  Continue with Trileptal 300 mg twice daily  Will check a Trileptal with BMP today  Follow up in 6 months or sooner if worse     Per [facility] statutes, patients with seizures are not allowed to drive until they have been seizure-free for six months.  Other recommendations include using caution when using heavy equipment or power tools. Avoid working on ladders or at heights. Take showers instead of baths.  Do not swim alone.  Ensure the water temperature is not too high on the home water heater. Do not go swimming alone. Do not lock yourself in a room alone (i.e. bathroom). When caring for infants or small children, sit down when holding, feeding, or changing them to minimize risk of injury to the child in the event you have a seizure. Maintain good sleep hygiene. Avoid alcohol.  Also  recommend adequate sleep,  hydration, good diet and minimize stress.   During the Seizure  - First, ensure adequate ventilation and place patients on the floor on their left side  Loosen clothing around the neck and ensure the airway is patent. If the patient is clenching the teeth, do not force the mouth open with any object as this can cause severe damage - Remove all items from the surrounding that can be hazardous. The patient may be oblivious to what's happening and may not even know what he or she is doing. If the patient is confused and wandering, either gently guide him/her away and block access to outside areas - Reassure the individual and be comforting - Call 911. In most cases, the seizure ends before EMS arrives. However, there are cases when seizures may last over 3 to 5 minutes. Or the individual may have developed breathing difficulties or severe injuries. If a pregnant patient or a person with diabetes develops a seizure, it is prudent to call an ambulance. - Finally, if the patient does not regain full consciousness, then call EMS. Most patients will remain confused for about 45 to 90 minutes after a seizure, so you must use judgment in calling for help. - Avoid restraints but make sure the patient is in a bed with padded side rails - Place the individual in a lateral position with the neck slightly flexed; this will help the saliva drain from the mouth and prevent the tongue from falling backward - Remove all nearby furniture and other hazards from the area - Provide verbal assurance as the individual is regaining consciousness - Provide the patient with privacy if possible - Call for help and start treatment as ordered by the caregiver   After the Seizure (Postictal Stage)  After a seizure, most patients experience confusion, fatigue, muscle pain and/or a headache. Thus, one should permit the individual to sleep. For the next few days, reassurance is essential. Being calm and  helping reorient the person is also of importance.  Most seizures are painless and end spontaneously. Seizures are not harmful to others but can lead to complications such as stress on the lungs, brain and the heart. Individuals with prior lung problems may develop labored breathing and respiratory distress.     Orders Placed This Encounter  Procedures   10-Hydroxycarbazepine   Basic Metabolic Panel     No orders of the defined types were placed in this encounter.    Return in about 6 months (around 03/18/2023).    Windell Norfolk, MD 09/17/2022, 3:57 PM  [facility] [address], [address] [place], [place] [address] [phone]

## 2022-09-17 NOTE — Patient Instructions (Signed)
Continue with Trileptal 300 mg twice daily  Will check a Trileptal with BMP today  Follow up in 6 months or sooner if worse

## 2022-09-21 DIAGNOSIS — F4310 Post-traumatic stress disorder, unspecified: Secondary | ICD-10-CM | POA: Diagnosis not present

## 2022-09-21 LAB — 10-HYDROXYCARBAZEPINE: Oxcarbazepine SerPl-Mcnc: 21 ug/mL (ref 10–35)

## 2022-09-21 LAB — BASIC METABOLIC PANEL
BUN/Creatinine Ratio: 15 (ref 9–23)
BUN: 12 mg/dL (ref 6–20)
CO2: 23 mmol/L (ref 20–29)
Calcium: 9.8 mg/dL (ref 8.7–10.2)
Chloride: 105 mmol/L (ref 96–106)
Creatinine, Ser: 0.78 mg/dL (ref 0.57–1.00)
Glucose: 105 mg/dL — ABNORMAL HIGH (ref 70–99)
Potassium: 3.8 mmol/L (ref 3.5–5.2)
Sodium: 141 mmol/L (ref 134–144)
eGFR: 109 mL/min/{1.73_m2} (ref >59)

## 2022-09-24 NOTE — Progress Notes (Signed)
Please call and advise the patient that the recent labs we checked were within normal limits. No further action is required on these tests at this time. Please remind patient to keep any upcoming appointments or tests and to call us with any interim questions, concerns, problems or updates. Thanks,   Ame Heagle, MD    

## 2022-10-04 DIAGNOSIS — S93401A Sprain of unspecified ligament of right ankle, initial encounter: Secondary | ICD-10-CM | POA: Diagnosis not present

## 2022-10-05 DIAGNOSIS — F4310 Post-traumatic stress disorder, unspecified: Secondary | ICD-10-CM | POA: Diagnosis not present

## 2022-10-10 ENCOUNTER — Ambulatory Visit: Payer: Medicaid Other | Admitting: Neurology

## 2022-11-02 DIAGNOSIS — F4310 Post-traumatic stress disorder, unspecified: Secondary | ICD-10-CM | POA: Diagnosis not present

## 2022-11-16 DIAGNOSIS — F4310 Post-traumatic stress disorder, unspecified: Secondary | ICD-10-CM | POA: Diagnosis not present

## 2022-11-20 ENCOUNTER — Ambulatory Visit: Payer: Medicaid Other | Admitting: Neurology

## 2023-01-01 DIAGNOSIS — F4310 Post-traumatic stress disorder, unspecified: Secondary | ICD-10-CM | POA: Diagnosis not present

## 2023-01-14 DIAGNOSIS — F4310 Post-traumatic stress disorder, unspecified: Secondary | ICD-10-CM | POA: Diagnosis not present

## 2023-02-08 DIAGNOSIS — F4310 Post-traumatic stress disorder, unspecified: Secondary | ICD-10-CM | POA: Diagnosis not present

## 2023-02-19 ENCOUNTER — Ambulatory Visit: Payer: Medicaid Other | Admitting: Neurology

## 2023-02-19 ENCOUNTER — Encounter: Payer: Self-pay | Admitting: Neurology

## 2023-02-19 VITALS — BP 121/73 | HR 81 | Ht 64.0 in | Wt 110.5 lb

## 2023-02-19 DIAGNOSIS — G40219 Localization-related (focal) (partial) symptomatic epilepsy and epileptic syndromes with complex partial seizures, intractable, without status epilepticus: Secondary | ICD-10-CM

## 2023-02-19 MED ORDER — OXCARBAZEPINE 300 MG PO TABS: 300.0000 mg | ORAL_TABLET | Freq: Two times a day (BID) | ORAL | 3 refills | Status: DC

## 2023-02-19 NOTE — Patient Instructions (Signed)
Continue with Oxcarbazepine 300 mg twice daily, refills given  Follow up in 6 months or sooner if worse

## 2023-02-19 NOTE — Progress Notes (Signed)
[facility]  PATIENT: [name] DOB: 2000/11/18  REFERRING CLINICIAN: Cora Collum, DO HISTORY FROM: Patient/Grandmother and chart review  REASON FOR VISIT: Seizures    HISTORICAL  CHIEF COMPLAINT:  Chief Complaint  Patient presents with   Seizures    Rm12, mom present  Seizure: last one was a few weeks ago cannot remember exact date. Pt has no concerns to report     INTERVAL HISTORY 02/19/2023:  Patient presents today for follow-up, she is accompanied by her mother.  Last visit was in January since then she has been doing well on Trileptal 300 mg twice daily.  Her last level was also normal.  She reported 2 weeks ago she had a breakthrough seizure in the setting of sleep deprivation, otherwise she has been doing very well, no other complaint no other concerns   INTERVAL HISTORY 09/17/2022:  [name] presents today for follow-up, she is accompanied by her grandmother.  Last visit was in September.  At that time plan was to continue oxcarbazepine 300 mg twice daily.  She did have a breakthrough seizure on Thanksgiving.  She reported having a total of 4 seizures and this was in the setting of fever.  She was taken to the hospital.  She was not admitted.  Her Trileptal level was not checked.  Since discharge from the hospital she has continued on Trileptal 300 mg twice daily, denies any seizures.  She does report some trouble with sleep, usually will go to sleep around 6 AM and wake up around 2 or 3 PM.  She does report difficulty with keeping up with her medication.  Some days she will forget to take the medication or at least 1 dose of the medication.  Denies any side effect. She is still working to get her disability.    INTERVAL HISTORY 05/15/2022:  Patient presents today for follow-up, she is accompanied by her grandmother.  Overall she is doing well, she is compliant with the oxcarbazepine 150 mg twice daily but reported having 2 breakthrough seizures in the  month of June.  She reported that it was stress related as she found her deceased cousin in his room, she had a seizure then and on the day of the funeral also she had another seizure.  Other than that she has been compliant with her medication.  She still continue to follow with behavioral health.  Her last oxcarbazepine level in July was low at 7.   INTERVAL HISTORY 10/17/2021:  Patient present today for follow-up, she is accompanied by her grandmother.  Denies any seizures since last visit in November.  Denies any side effect from the medication, she is compliant with oxcarbazepine 150 mg twice daily.  Headaches are controlled also with gabapentin 300 mg at night.  Denies any additional complaints and no concerns.  Currently, she is applying for disability regarding her seizure disorder.   INTERVAL HISTORY 07/05/2021 Patient presented there for follow-up with mother grandmother, last visit was on September 30 at that time plan was to continue ox carb 150 twice daily.  She report compliance with medication.  No seizures since last visit and denies any side effect from the medication.  Overall she is doing well.  Her last oxcarb level was 8. doing well, compliant with the medications, no side effects from the medications. In terms of her headaches, she reported improvement, she is on gabapentin 300 mg nightly and also reported good sleep.  No current question or concern.   HISTORY OF  PRESENT ILLNESS:  This is a 22 year old woman with past medical history of seizures, anxiety who is presenting after hospital discharge for breakthrough seizure.  Patient was admitted from September 7 September 10 after having a seizure at her outpatient visit.  During her admission, she was trialed on levetiracetam which caused acute psychosis, she was seen by psychiatry, levetiracetam discontinued and patient was started on oxcarbazepine with return to baseline mental status.  Hospital course listed below.  She had a video  EEG which did not show any seizure even though patient report of the leads.  She also had a brain MRI which shows subtle asymmetric atrophic involving the mesial left temporal lobe and left hippocampus as compared to the right.  She reports since being discharged from the hospital she has not had any additional seizures.  She is compliant with her oxcarbazepine 150 mg twice a day, sometimes she forgets to take the medication early morning but she will take it in the early afternoon.  Denies any additional breakthrough seizures but report nightly headaches.  She is taking Tylenol and ibuprofen for headaches.  Currently she does not have any complaint.  We discussed about pregnancy and she says she is not using any birth control pills and is considering getting pregnant.  When grandmother attempting to provide additional history, patient got upset and left the room.     Hospital discharge summary below  Patient presented to the ED after having witnessed seizure in her PCP clinic.  She has a longstanding history of seizures.  She had been previously well controlled on Depakote, however started Lamictal approximately 1 month ago as she desired to become pregnant and has continued to have breakthrough seizures.  Additionally Lamictal has caused significant side effects which make it difficult for her to take her medicine. Urine drug screen, ethanol and pregnancy test were negative.  Work-up including her CBC, glucose, TSH, electrolytes noncontributory.  Patient was placed Keppra per neurology, and given as needed Ativan for seizures lasting greater than 5 minutes.  Patient's first overnight EEG did not show any seizure activity, despite patient having symptoms.  Patient became very agitated during her first night of admission, experiencing auditory and visual hallucinations, responding to internal stimuli.  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  Neurology discontinued Keppra due to worsening psychosis profile, and switched to oxcarbazepine.  With her continued disoriented state and negative EEG neurology ordered MRI which showed subtle asymmetric atrophy involving the mesial left temporal lobe and left hippocampus as compared to the right, without any acute intracranial abnormality identified.  This was followed up by another overnight EEG which was within normal limits.  She was placed on 24-hour video surveillance, to better characterize seizure activity episodes, which was normal.  Likely caused by side effects of lamictal. Patient tolerated oxcarbazepine well and returned to baseline cognition and functioning on 9/9. Neurology outpatient follow up scheduled.   Handedness: Right   Seizure Type: ?unclear, described as generalized tonic clonic   Current frequency: 2 to 3 per months but improved since Oxcarbazepine was started   Any injuries from seizures: None reported   Seizure risk factors: Family history of seizures   Previous ASMs: Valproic acid, Levetiracetam, Lamotrigine and Oxcarbazepine   Currenty ASMs: Oxcarbazepine 300mg  BID   ASMs side effects: headaches   Brain Images: Subtle asymmetric atrophy involving the mesial left temporal lobe and left  hippocampus as compared to the right  Previous EEGs: Normal    OTHER MEDICAL CONDITIONS: Anxiety, depression, memory problems   REVIEW OF SYSTEMS: Full 14 system review of systems performed and negative with exception of: as noted in the HPI  ALLERGIES: Allergies  Allergen Reactions   Lamotrigine     Dizziness, Hallucinations  Other Reaction(s): Dizziness (intolerance)   Codeine    Latex    Levetiracetam     psychosis  Other Reaction(s): Other (See Comments)   Tdap [Tetanus-Diphth-Acell Pertussis]     HOME MEDICATIONS: Outpatient Medications Prior to Visit  Medication Sig Dispense Refill   Oxcarbazepine (TRILEPTAL)  300 MG tablet Take 1 tablet (300 mg total) by mouth 2 (two) times daily. 180 tablet 3   dextromethorphan-guaiFENesin (MUCINEX DM) 30-600 MG 12hr tablet Take 1 tablet by mouth 2 (two) times daily. 20 tablet 0   fluticasone (FLONASE) 50 MCG/ACT nasal spray Place 2 sprays into both nostrils daily. 11.1 g 2   OXcarbazepine (TRILEPTAL) 150 MG tablet Take 150 mg by mouth 2 (two) times daily.     No facility-administered medications prior to visit.    PAST MEDICAL HISTORY: Past Medical History:  Diagnosis Date   Seizure (HCC)    Seizures (HCC)    Well child check 04/26/2011    PAST SURGICAL HISTORY: Past Surgical History:  Procedure Laterality Date   LEG SURGERY      FAMILY HISTORY: Family History  Problem Relation Age of Onset   Healthy Mother    Seizures Father    Seizures Sister     SOCIAL HISTORY: Social History   Socioeconomic History   Marital status: Single    Spouse name: Not on file   Number of children: 0   Years of education: Not on file   Highest education level: High school graduate  Occupational History   Not on file  Tobacco Use   Smoking status: Never   Smokeless tobacco: Never  Vaping Use   Vaping Use: Never used  Substance and Sexual Activity   Alcohol use: No   Drug use: No   Sexual activity: Not Currently    Birth control/protection: None  Other Topics Concern   Not on file  Social History Narrative   Lives at home between [employer] house and grandmother's house    Social Determinants of Health   Financial Resource Strain: Not on file  Food Insecurity: Not on file  Transportation Needs: Not on file  Physical Activity: Not on file  Stress: Not on file  Social Connections: Not on file  Intimate Partner Violence: Not on file     PHYSICAL EXAM  GENERAL EXAM/CONSTITUTIONAL: Vitals:  Vitals:   02/19/23 1421  BP: 121/73  Pulse: 81  Weight: 110 lb 8 oz (50.1 kg)  Height: 5\' 4"  (1.626 m)    Body mass index is 18.97 kg/m. Wt Readings  from Last 3 Encounters:  02/19/23 110 lb 8 oz (50.1 kg)  09/17/22 112 lb (50.8 kg)  05/15/22 112 lb (50.8 kg)   Patient is in no distress; well developed, nourished and groomed; neck is supple  MUSCULOSKELETAL: Gait, strength, tone, movements noted in Neurologic exam below  NEUROLOGIC: MENTAL STATUS:      No data to display         awake, alert, oriented to person, place and time Difficulty with recent memory Difficulty with attention and concentration language fluent, comprehension intact, naming intact fund of knowledge appropriate   MOTOR:  normal bulk and tone,  full strength in the BUE, BLE   GAIT/STATION:  normal   DIAGNOSTIC DATA (LABS, IMAGING, TESTING) - I reviewed patient records, labs, notes, testing and imaging myself where available.  Lab Results  Component Value Date   WBC 4.3 07/26/2022   HGB 12.3 07/26/2022   HCT 38.7 07/26/2022   MCV 91.1 07/26/2022   PLT 299 07/26/2022      Component Value Date/Time   NA 141 09/17/2022 1519   K 3.8 09/17/2022 1519   CL 105 09/17/2022 1519   CO2 23 09/17/2022 1519   GLUCOSE 105 (H) 09/17/2022 1519   GLUCOSE 108 (H) 07/26/2022 2200   BUN 12 09/17/2022 1519   CREATININE 0.78 09/17/2022 1519   CREATININE 0.67 03/28/2016 0858   CALCIUM 9.8 09/17/2022 1519   PROT 6.0 (L) 07/26/2022 2200   ALBUMIN 3.3 (L) 07/26/2022 2200   AST 10 (L) 07/26/2022 2200   ALT 7 07/26/2022 2200   ALKPHOS 33 (L) 07/26/2022 2200   BILITOT 0.3 07/26/2022 2200   GFRNONAA >60 07/26/2022 2200   GFRNONAA SEE NOTE 03/28/2016 0858   GFRAA >60 11/21/2018 2104   GFRAA SEE NOTE 03/28/2016 0858   No results found for: "CHOL", "HDL", "LDLCALC", "LDLDIRECT", "TRIG" No results found for: "HGBA1C" No results found for: "VITAMINB12" Lab Results  Component Value Date   TSH 1.234 05/10/2021   Oxcarbazepine level 06/02/2021            8  Oxcarbazepine level 03/26/2022:     7   Brain MRI  1. Subtle asymmetric atrophy involving the mesial  left temporal lobe and left hippocampus as compared to the right. No convincing corresponding signal abnormality as is typically seen with mesial temporal sclerosis. Correlation with EEG recommended. 2. Otherwise normal brain MRI. No other acute intracranial abnormality identified.   EEG  Description: The posterior dominant rhythm consists of 8 Hz activity of moderate voltage (25-35 uV) seen predominantly in posterior head regions, symmetric and reactive to eye opening and eye closing. Hyperventilation and photic stimulation were not performed.      Of note, patient pulled out her electrodes around 5 AM on 05/11/2021 after which this study was not interpretable.   IMPRESSION: This study is within normal limits. No seizures or epileptiform discharges were seen throughout the recording.   I personally reviewed brain Images and previous EEG reports.    ASSESSMENT AND PLAN  22 y.o. year old female  with long history of seizure disorder who is presenting for follow-up for epilepsy.  Overall doing well on Oxcarbazepine 300 mg BID, but had a breakthrough seizure in the setting of sleep deprivation.  Plan for now is to continue with Trileptal 300 mg twice daily and follow up in 6 months or sooner if worse.    1. Partial symptomatic epilepsy with complex partial seizures, intractable, without status epilepticus (HCC)     Patient Instructions  Continue with Oxcarbazepine 300 mg twice daily, refills given  Follow up in 6 months or sooner if worse     Per [facility] statutes, patients with seizures are not allowed to drive until they have been seizure-free for six months.  Other recommendations include using caution when using heavy equipment or power tools. Avoid working on ladders or at heights. Take showers instead of baths.  Do not swim alone.  Ensure the water temperature is not too high on the home water heater. Do not go swimming alone. Do not lock yourself in a room alone (i.e.  bathroom).  When caring for infants or small children, sit down when holding, feeding, or changing them to minimize risk of injury to the child in the event you have a seizure. Maintain good sleep hygiene. Avoid alcohol.  Also recommend adequate sleep, hydration, good diet and minimize stress.   During the Seizure  - First, ensure adequate ventilation and place patients on the floor on their left side  Loosen clothing around the neck and ensure the airway is patent. If the patient is clenching the teeth, do not force the mouth open with any object as this can cause severe damage - Remove all items from the surrounding that can be hazardous. The patient may be oblivious to what's happening and may not even know what he or she is doing. If the patient is confused and wandering, either gently guide him/her away and block access to outside areas - Reassure the individual and be comforting - Call 911. In most cases, the seizure ends before EMS arrives. However, there are cases when seizures may last over 3 to 5 minutes. Or the individual may have developed breathing difficulties or severe injuries. If a pregnant patient or a person with diabetes develops a seizure, it is prudent to call an ambulance. - Finally, if the patient does not regain full consciousness, then call EMS. Most patients will remain confused for about 45 to 90 minutes after a seizure, so you must use judgment in calling for help. - Avoid restraints but make sure the patient is in a bed with padded side rails - Place the individual in a lateral position with the neck slightly flexed; this will help the saliva drain from the mouth and prevent the tongue from falling backward - Remove all nearby furniture and other hazards from the area - Provide verbal assurance as the individual is regaining consciousness - Provide the patient with privacy if possible - Call for help and start treatment as ordered by the caregiver   After the Seizure  (Postictal Stage)  After a seizure, most patients experience confusion, fatigue, muscle pain and/or a headache. Thus, one should permit the individual to sleep. For the next few days, reassurance is essential. Being calm and helping reorient the person is also of importance.  Most seizures are painless and end spontaneously. Seizures are not harmful to others but can lead to complications such as stress on the lungs, brain and the heart. Individuals with prior lung problems may develop labored breathing and respiratory distress.     No orders of the defined types were placed in this encounter.    Meds ordered this encounter  Medications   Oxcarbazepine (TRILEPTAL) 300 MG tablet    Sig: Take 1 tablet (300 mg total) by mouth 2 (two) times daily.    Dispense:  180 tablet    Refill:  3     Return in about 6 months (around 08/21/2023).    Windell Norfolk, MD 02/19/2023, 2:33 PM  [facility] [address], [address] [place], [place] [address] [phone]

## 2023-04-22 ENCOUNTER — Telehealth: Payer: Self-pay | Admitting: Neurology

## 2023-04-22 NOTE — Telephone Encounter (Signed)
My chart reschedule

## 2023-04-25 ENCOUNTER — Ambulatory Visit
Admission: EM | Admit: 2023-04-25 | Discharge: 2023-04-25 | Disposition: A | Discharge status: Home / Self Care | Payer: Medicaid Other | Attending: Physician Assistant | Admitting: Physician Assistant

## 2023-04-25 DIAGNOSIS — Z20822 Contact with and (suspected) exposure to covid-19: Secondary | ICD-10-CM

## 2023-04-25 DIAGNOSIS — J019 Acute sinusitis, unspecified: Secondary | ICD-10-CM

## 2023-04-25 DIAGNOSIS — N760 Acute vaginitis: Secondary | ICD-10-CM | POA: Diagnosis not present

## 2023-04-25 MED ORDER — AMOXICILLIN-POT CLAVULANATE 875-125 MG PO TABS: 1.0000 | ORAL_TABLET | Freq: Two times a day (BID) | ORAL | 0 refills | Status: DC

## 2023-04-25 NOTE — ED Provider Notes (Signed)
[name]    CSN: [id number] Arrival date & time: 04/25/23  1620      History   Chief Complaint Chief Complaint  Patient presents with   Generalized Body Aches    With ha and chills    HPI [name] is a 22 y.o. female.   Patient here today for evaluation of bodyaches, chills and headache that started about a week ago.  She reports some sinus congestion as well and sinus pressure.  She is unsure if she had COVID but other family members have been sick as well.  She denies any fever.  She has very minimal cough.  She also reports vaginal discomfort.  She does not report discharge.  She would like screening for BV.   The history is provided by the patient.    Past Medical History:  Diagnosis Date   Seizure (HCC)    Seizures (HCC)    Well child check 04/26/2011    Patient Active Problem List   Diagnosis Date Noted   Birth control counseling 12/03/2021   LGSIL on Pap smear of cervix 12/03/2021   Routine screening for STI (sexually transmitted infection) 07/09/2021   Brief psychotic disorder (HCC)    Hypokalemia    Seizure-like activity (HCC) 01/28/2021   Anemia    Strep throat    Encounter for Nexplanon removal 11/04/2020   Family history of brain cancer 05/20/2015   Seizures (HCC) 10/31/2006    Past Surgical History:  Procedure Laterality Date   LEG SURGERY      OB History   No obstetric history on file.      Home Medications    Prior to Admission medications   Medication Sig Start Date End Date Taking? Authorizing Provider  amoxicillin-clavulanate (AUGMENTIN) 875-125 MG tablet Take 1 tablet by mouth every 12 (twelve) hours. 04/25/23  Yes Tomi Bamberger, [name]  Oxcarbazepine (TRILEPTAL) 300 MG tablet Take 1 tablet (300 mg total) by mouth 2 (two) times daily. 02/19/23 02/14/24 Yes Camara, Amalia Hailey, MD  loratadine (CLARITIN) 10 MG tablet Take 1 tablet (10 mg total) by mouth daily. 10/15/18 01/28/21  Nestor Ramp, MD    Family History Family  History  Problem Relation Age of Onset   Healthy Mother    Seizures Father    Seizures Sister     Social History Social History   Tobacco Use   Smoking status: Never   Smokeless tobacco: Never  Vaping Use   Vaping status: Never Used  Substance Use Topics   Alcohol use: No   Drug use: No     Allergies   Lamotrigine, Codeine, Latex, Levetiracetam, and Tdap [tetanus-diphth-acell pertussis]   Review of Systems Review of Systems  Constitutional:  Negative for chills and fever.  HENT:  Positive for congestion, sinus pressure and sore throat. Negative for ear pain.   Eyes:  Negative for discharge and redness.  Respiratory:  Positive for cough. Negative for shortness of breath and wheezing.   Gastrointestinal:  Negative for abdominal pain, diarrhea, nausea and vomiting.  Genitourinary:  Positive for vaginal bleeding. Negative for vaginal discharge.     Physical Exam Triage Vital Signs ED Triage Vitals  Encounter Vitals Group     BP 04/25/23 1713 97/66     Systolic BP Percentile --      Diastolic BP Percentile --      Pulse Rate 04/25/23 1713 94     Resp 04/25/23 1713 16     Temp 04/25/23 1713 97.8 F (  36.6 C)     Temp Source 04/25/23 1713 Oral     SpO2 04/25/23 1713 99 %     Weight 04/25/23 1711 110 lb (49.9 kg)     Height 04/25/23 1711 5\' 3"  (1.6 m)     Head Circumference --      Peak Flow --      Pain Score 04/25/23 1709 0     Pain Loc --      Pain Education --      Exclude from Growth Chart --    No data found.  Updated Vital Signs BP 97/66 (BP Location: Right Arm)   Pulse 94   Temp 97.8 F (36.6 C) (Oral)   Resp 16   Ht 5\' 3"  (1.6 m)   Wt 110 lb (49.9 kg)   LMP 04/21/2023 (Exact Date)   SpO2 99%   BMI 19.49 kg/m      Physical Exam Vitals and nursing note reviewed.  Constitutional:      General: She is not in acute distress.    Appearance: Normal appearance. She is not ill-appearing.  HENT:     Head: Normocephalic and atraumatic.     Nose:  Congestion present.     Mouth/Throat:     Mouth: Mucous membranes are moist.     Pharynx: Oropharynx is clear. No oropharyngeal exudate or posterior oropharyngeal erythema.  Eyes:     Conjunctiva/sclera: Conjunctivae normal.  Cardiovascular:     Rate and Rhythm: Normal rate and regular rhythm.  Pulmonary:     Effort: Pulmonary effort is normal. No respiratory distress.     Breath sounds: Normal breath sounds. No wheezing, rhonchi or rales.  Neurological:     Mental Status: She is alert.  Psychiatric:        Mood and Affect: Mood normal.        Behavior: Behavior normal.        Thought Content: Thought content normal.      UC Treatments / Results  Labs (all labs ordered are listed, but only abnormal results are displayed) Labs Reviewed  SARS CORONAVIRUS 2 (TAT 6-24 HRS)  CERVICOVAGINAL ANCILLARY ONLY    EKG   Radiology No results found.  Procedures Procedures (including critical care time)  Medications Ordered in UC Medications - No data to display  Initial Impression / Assessment and Plan / UC Course  I have reviewed the triage vital signs and the nursing notes.  Pertinent labs & imaging results that were available during my care of the patient were reviewed by me and considered in my medical decision making (see chart for details).    Will treat with Augmentin given duration of symptoms to cover for possible sinusitis.  Suspect likely initial viral etiology of symptoms and will screen for COVID at patient's request.  Will await results further recommendation.  Screening ordered for BV, yeast, gonorrhea chlamydia and trichomonas.  Encouraged follow-up with any further concerns or questions.  Final Clinical Impressions(s) / UC Diagnoses   Final diagnoses:  Exposure to COVID-19 virus  Acute sinusitis, recurrence not specified, unspecified location  Acute vaginitis   Discharge Instructions   None    ED Prescriptions     Medication Sig Dispense Auth. Provider    amoxicillin-clavulanate (AUGMENTIN) 875-125 MG tablet Take 1 tablet by mouth every 12 (twelve) hours. 14 tablet Tomi Bamberger, [name]      PDMP not reviewed this encounter.   Tomi Bamberger, [name] 04/25/23 1750

## 2023-04-25 NOTE — ED Notes (Signed)
Specimen was obtained by patient, labeled after verifying with 2 identifiers, placed in lab

## 2023-04-25 NOTE — ED Triage Notes (Signed)
"  I went out of town recently and got sick". "I still have body aches, chills, headache". Possible exposure to COVID19. Symptoms began "about a week ago". No fever known. No cough. No runny nose.

## 2023-04-26 LAB — CERVICOVAGINAL ANCILLARY ONLY
Bacterial Vaginitis (gardnerella): NEGATIVE
Candida Glabrata: NEGATIVE
Candida Vaginitis: NEGATIVE
Chlamydia: NEGATIVE
Comment: NEGATIVE
Comment: NEGATIVE
Comment: NEGATIVE
Comment: NEGATIVE
Comment: NEGATIVE
Comment: NORMAL
Neisseria Gonorrhea: NEGATIVE
Trichomonas: NEGATIVE

## 2023-04-26 LAB — SARS CORONAVIRUS 2 (TAT 6-24 HRS): SARS Coronavirus 2: NEGATIVE

## 2023-06-04 ENCOUNTER — Other Ambulatory Visit: Payer: Self-pay | Admitting: Neurology

## 2023-08-06 ENCOUNTER — Ambulatory Visit: Payer: Medicaid Other | Admitting: Neurology

## 2023-08-06 ENCOUNTER — Encounter: Payer: Self-pay | Admitting: Neurology

## 2023-08-26 ENCOUNTER — Ambulatory Visit: Payer: Medicaid Other | Admitting: Neurology

## 2023-11-05 ENCOUNTER — Ambulatory Visit: Payer: Medicaid Other | Admitting: Neurology

## 2023-11-05 ENCOUNTER — Encounter: Payer: Self-pay | Admitting: Neurology

## 2023-11-05 VITALS — BP 112/78 | HR 90 | Ht 65.0 in | Wt 108.5 lb

## 2023-11-05 DIAGNOSIS — G40219 Localization-related (focal) (partial) symptomatic epilepsy and epileptic syndromes with complex partial seizures, intractable, without status epilepticus: Secondary | ICD-10-CM | POA: Diagnosis not present

## 2023-11-05 DIAGNOSIS — Z5181 Encounter for therapeutic drug level monitoring: Secondary | ICD-10-CM

## 2023-11-05 NOTE — Patient Instructions (Signed)
 Continue with oxcarbazepine 300 mg twice daily Will check oxcarbazepine level and BMP Call for any seizures Return in 6 months or sooner if worse.

## 2023-11-05 NOTE — Progress Notes (Signed)
 [facility]  PATIENT: [name] DOB: [birth year]  REFERRING CLINICIAN: Baloch, Mahnoor, MD HISTORY FROM: Patient/Grandmother and chart review  REASON FOR VISIT: Seizures    HISTORICAL  CHIEF COMPLAINT:  Chief Complaint  Patient presents with   Room 12    Pt is here with her Mother. Pt states that she had a seizure on Super Bowl in the car. 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    INTERVAL HISTORY 11/05/2023:  Patient presents today for follow-up, she is accompanied by grandmother.  Last visit was in June, since then she has been doing well on Trileptal 300 mg twice daily.  Tells me her last seizure was on SuperBowl Sunday when she was riding in the car and the glare and the lights caused her to have a seizure.  She does report compliance with her Oxcarbazepine.  Stated at that time she was not sleep deprived.  Could not think of anything that can trigger seizures other than the lights.  She is still waiting a decision regarding disability.   INTERVAL HISTORY 02/19/2023:  Patient presents today for follow-up, she is accompanied by her mother.  Last visit was in January since then she has been doing well on Trileptal 300 mg twice daily.  Her last level was also normal.  She reported 2 weeks ago she had a breakthrough seizure in the setting of sleep deprivation, otherwise she has been doing very well, no other complaint no other concerns   INTERVAL HISTORY 09/17/2022:  [name] presents today for follow-up, she is accompanied by her grandmother.  Last visit was in September.  At that time plan was to continue oxcarbazepine 300 mg twice daily.  She did have a breakthrough seizure on Thanksgiving.  She reported having a total of 4 seizures and this was in the setting of fever.  She was taken to the hospital.  She was not admitted.  Her Trileptal level was not checked.  Since discharge from the hospital  she has continued on Trileptal 300 mg twice daily, denies any seizures.  She does report some trouble with sleep, usually will go to sleep around 6 AM and wake up around 2 or 3 PM.  She does report difficulty with keeping up with her medication.  Some days she will forget to take the medication or at least 1 dose of the medication.  Denies any side effect. She is still working to get her disability.    INTERVAL HISTORY 05/15/2022:  Patient presents today for follow-up, she is accompanied by her grandmother.  Overall she is doing well, she is compliant with the oxcarbazepine 150 mg twice daily but reported having 2 breakthrough seizures in the month of June.  She reported that it was stress related as she found her deceased cousin in his room, she had a seizure then and on the day of the funeral also she had another seizure.  Other than that she has been compliant with her medication.  She still continue to follow with behavioral health.  Her last oxcarbazepine level in July was low at 7.   INTERVAL HISTORY 10/17/2021:  Patient present today for follow-up, she is accompanied by her grandmother.  Denies any seizures since last visit in November.  Denies any side effect from the medication, she is compliant with oxcarbazepine 150 mg twice daily.  Headaches are controlled also with gabapentin 300 mg at night.  Denies any additional complaints and no concerns.  Currently, she is applying for disability regarding her seizure disorder.   INTERVAL HISTORY 07/05/2021 Patient presented there for follow-up with mother grandmother, last visit was on September 30 at that time plan was to continue ox carb 150 twice daily.  She report compliance with medication.  No seizures since last visit and denies any side effect from the medication.  Overall she is doing well.  Her last oxcarb level was 8. doing well, compliant with the medications, no side effects from the medications. In terms of her headaches, she reported  improvement, she is on gabapentin 300 mg nightly and also reported good sleep.  No current question or concern.   HISTORY OF PRESENT ILLNESS:  This is a 23 year old woman with past medical history of seizures, anxiety who is presenting after hospital discharge for breakthrough seizure.  Patient was admitted from September 7 September 10 after having a seizure at her outpatient visit.  During her admission, she was trialed on levetiracetam which caused acute psychosis, she was seen by psychiatry, levetiracetam discontinued and patient was started on oxcarbazepine with return to baseline mental status.  Hospital course listed below.  She had a video EEG which did not show any seizure even though patient report of the leads.  She also had a brain MRI which shows subtle asymmetric atrophic involving the mesial left temporal lobe and left hippocampus as compared to the right.  She reports since being discharged from the hospital she has not had any additional seizures.  She is compliant with her oxcarbazepine 150 mg twice a day, sometimes she forgets to take the medication early morning but she will take it in the early afternoon.  Denies any additional breakthrough seizures but report nightly headaches.  She is taking Tylenol and ibuprofen for headaches.  Currently she does not have any complaint.  We discussed about pregnancy and she says she is not using any birth control pills and is considering getting pregnant.  When grandmother attempting to provide additional history, patient got upset and left the room.     Hospital discharge summary below  Patient presented to the ED after having witnessed seizure in her PCP clinic.  She has a longstanding history of seizures.  She had been previously well controlled on Depakote, however started Lamictal approximately 1 month ago as she desired to become pregnant and has continued to have breakthrough seizures.  Additionally Lamictal has caused significant side effects  which make it difficult for her to take her medicine. Urine drug screen, ethanol and pregnancy test were negative.  Work-up including her CBC, glucose, TSH, electrolytes noncontributory.  Patient was placed Keppra per neurology, and given as needed Ativan for seizures lasting greater than 5 minutes.  Patient's first overnight EEG did not show any seizure activity, despite patient having symptoms.  Patient became very agitated during her first night of admission, experiencing auditory and visual hallucinations, responding to internal stimuli.  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  Neurology discontinued Keppra due to worsening psychosis profile, and switched to oxcarbazepine.  With her continued disoriented state and negative EEG neurology ordered MRI which showed subtle asymmetric atrophy involving the mesial left temporal lobe and left hippocampus as compared to the right, without any acute intracranial abnormality identified.  This was followed up by another overnight EEG which was within normal limits.  She was  placed on 24-hour video surveillance, to better characterize seizure activity episodes, which was normal.  Likely caused by side effects of lamictal. Patient tolerated oxcarbazepine well and returned to baseline cognition and functioning on 9/9. Neurology outpatient follow up scheduled.   Handedness: Right   Seizure Type: ?unclear, described as generalized tonic clonic   Current frequency: 2 to 3 per months but improved since Oxcarbazepine was started   Any injuries from seizures: None reported   Seizure risk factors: Family history of seizures   Previous ASMs: Valproic acid, Levetiracetam, Lamotrigine and Oxcarbazepine   Currenty ASMs: Oxcarbazepine 300mg  BID   ASMs side effects: headaches   Brain Images: Subtle asymmetric atrophy involving the mesial left temporal lobe  and left hippocampus as compared to the right  Previous EEGs: Normal    OTHER MEDICAL CONDITIONS: Anxiety, depression, memory problems   REVIEW OF SYSTEMS: Full 14 system review of systems performed and negative with exception of: as noted in the HPI  ALLERGIES: Allergies  Allergen Reactions   Lamotrigine     Dizziness, Hallucinations  Other Reaction(s): Dizziness (intolerance)   Codeine    Latex    Levetiracetam     psychosis  Other Reaction(s): Other (See Comments)   Tdap [Tetanus-Diphth-Acell Pertussis]     HOME MEDICATIONS: Outpatient Medications Prior to Visit  Medication Sig Dispense Refill   Oxcarbazepine (TRILEPTAL) 300 MG tablet TAKE 1 TABLET BY MOUTH 2 TIMES DAILY. 180 tablet 3   amoxicillin-clavulanate (AUGMENTIN) 875-125 MG tablet Take 1 tablet by mouth every 12 (twelve) hours. (Patient not taking: Reported on 11/05/2023) 14 tablet 0   No facility-administered medications prior to visit.    PAST MEDICAL HISTORY: Past Medical History:  Diagnosis Date   Seizure (HCC)    Seizures (HCC)    Well child check 04/26/2011    PAST SURGICAL HISTORY: Past Surgical History:  Procedure Laterality Date   LEG SURGERY      FAMILY HISTORY: Family History  Problem Relation Age of Onset   Healthy Mother    Seizures Father    Seizures Sister     SOCIAL HISTORY: Social History   Socioeconomic History   Marital status: Single    Spouse name: Not on file   Number of children: 0   Years of education: Not on file   Highest education level: High school graduate  Occupational History   Not on file  Tobacco Use   Smoking status: Never   Smokeless tobacco: Never  Vaping Use   Vaping status: Never Used  Substance and Sexual Activity   Alcohol use: No   Drug use: No   Sexual activity: Not Currently    Birth control/protection: None  Other Topics Concern   Not on file  Social History Narrative   Lives at home between [employer] house and grandmother's house     Social Drivers of Health   Financial Resource Strain: Not on file  Food Insecurity: Not on file  Transportation Needs: Not on file  Physical Activity: Not on file  Stress: Not on file  Social Connections: Not on file  Intimate Partner Violence: Not on file     PHYSICAL EXAM  GENERAL EXAM/CONSTITUTIONAL: Vitals:  Vitals:   11/05/23 1202  BP: 112/78  Pulse: 90  Weight: 108 lb 8 oz (49.2 kg)  Height: 5\' 5"  (1.651 m)     Body mass index is 18.06 kg/m. Wt Readings from Last 3 Encounters:  11/05/23 108 lb 8 oz (49.2 kg)  04/25/23 110 lb (49.9  kg)  02/19/23 110 lb 8 oz (50.1 kg)   Patient is in no distress; well developed, nourished and groomed; neck is supple  MUSCULOSKELETAL: Gait, strength, tone, movements noted in Neurologic exam below  NEUROLOGIC: MENTAL STATUS:      No data to display         awake, alert, oriented to person, place and time Difficulty with recent memory Difficulty with attention and concentration language fluent, comprehension intact, naming intact fund of knowledge appropriate   MOTOR:  normal bulk and tone, full strength in the BUE, BLE   GAIT/STATION:  normal   DIAGNOSTIC DATA (LABS, IMAGING, TESTING) - I reviewed patient records, labs, notes, testing and imaging myself where available.  Lab Results  Component Value Date   WBC 4.3 07/26/2022   HGB 12.3 07/26/2022   HCT 38.7 07/26/2022   MCV 91.1 07/26/2022   PLT 299 07/26/2022      Component Value Date/Time   NA 141 09/17/2022 1519   K 3.8 09/17/2022 1519   CL 105 09/17/2022 1519   CO2 23 09/17/2022 1519   GLUCOSE 105 (H) 09/17/2022 1519   GLUCOSE 108 (H) 07/26/2022 2200   BUN 12 09/17/2022 1519   CREATININE 0.78 09/17/2022 1519   CREATININE 0.67 03/28/2016 0858   CALCIUM 9.8 09/17/2022 1519   PROT 6.0 (L) 07/26/2022 2200   ALBUMIN 3.3 (L) 07/26/2022 2200   AST 10 (L) 07/26/2022 2200   ALT 7 07/26/2022 2200   ALKPHOS 33 (L) 07/26/2022 2200   BILITOT 0.3  07/26/2022 2200   GFRNONAA >60 07/26/2022 2200   GFRNONAA SEE NOTE 03/28/2016 0858   GFRAA >60 11/21/2018 2104   GFRAA SEE NOTE 03/28/2016 0858   No results found for: "CHOL", "HDL", "LDLCALC", "LDLDIRECT", "TRIG" No results found for: "HGBA1C" No results found for: "VITAMINB12" Lab Results  Component Value Date   TSH 1.234 05/10/2021   Oxcarbazepine level 06/02/2021            8  Oxcarbazepine level 03/26/2022:     7   Brain MRI  1. Subtle asymmetric atrophy involving the mesial left temporal lobe and left hippocampus as compared to the right. No convincing corresponding signal abnormality as is typically seen with mesial temporal sclerosis. Correlation with EEG recommended. 2. Otherwise normal brain MRI. No other acute intracranial abnormality identified.   EEG Description: The posterior dominant rhythm consists of 8 Hz activity of moderate voltage (25-35 uV) seen predominantly in posterior head regions, symmetric and reactive to eye opening and eye closing. Hyperventilation and photic stimulation were not performed.      Of note, patient pulled out her electrodes around 5 AM on 05/11/2021 after which this study was not interpretable.   IMPRESSION: This study is within normal limits. No seizures or epileptiform discharges were seen throughout the recording.   I personally reviewed brain Images and previous EEG reports.    ASSESSMENT AND PLAN  23 y.o. year old female  with long history of seizure disorder who is presenting for follow-up for epilepsy.  Overall doing well on Oxcarbazepine 300 mg BID, but had a breakthrough seizure on SuperBowl Sunday.  She reports medication compliance.  Plan will be to check oxcarbazepine level and BMP and continue patient on oxcarbazepine 300 mg twice daily.  I will see her in 6 months for follow-up or sooner if worse.  She understands to contact us if she does have a breakthrough seizure.     1. Partial symptomatic epilepsy with complex partial  seizures, intractable, without status epilepticus (HCC)   2. Therapeutic drug monitoring      Patient Instructions  Continue with oxcarbazepine 300 mg twice daily Will check oxcarbazepine level and BMP Call for any seizures Return in 6 months or sooner if worse.    Per [facility] statutes, patients with seizures are not allowed to drive until they have been seizure-free for six months.  Other recommendations include using caution when using heavy equipment or power tools. Avoid working on ladders or at heights. Take showers instead of baths.  Do not swim alone.  Ensure the water temperature is not too high on the home water heater. Do not go swimming alone. Do not lock yourself in a room alone (i.e. bathroom). When caring for infants or small children, sit down when holding, feeding, or changing them to minimize risk of injury to the child in the event you have a seizure. Maintain good sleep hygiene. Avoid alcohol.  Also recommend adequate sleep, hydration, good diet and minimize stress.   During the Seizure  - First, ensure adequate ventilation and place patients on the floor on their left side  Loosen clothing around the neck and ensure the airway is patent. If the patient is clenching the teeth, do not force the mouth open with any object as this can cause severe damage - Remove all items from the surrounding that can be hazardous. The patient may be oblivious to what's happening and may not even know what he or she is doing. If the patient is confused and wandering, either gently guide him/her away and block access to outside areas - Reassure the individual and be comforting - Call 911. In most cases, the seizure ends before EMS arrives. However, there are cases when seizures may last over 3 to 5 minutes. Or the individual may have developed breathing difficulties or severe injuries. If a pregnant patient or a person with diabetes develops a seizure, it is prudent to call an  ambulance. - Finally, if the patient does not regain full consciousness, then call EMS. Most patients will remain confused for about 45 to 90 minutes after a seizure, so you must use judgment in calling for help. - Avoid restraints but make sure the patient is in a bed with padded side rails - Place the individual in a lateral position with the neck slightly flexed; this will help the saliva drain from the mouth and prevent the tongue from falling backward - Remove all nearby furniture and other hazards from the area - Provide verbal assurance as the individual is regaining consciousness - Provide the patient with privacy if possible - Call for help and start treatment as ordered by the caregiver   After the Seizure (Postictal Stage)  After a seizure, most patients experience confusion, fatigue, muscle pain and/or a headache. Thus, one should permit the individual to sleep. For the next few days, reassurance is essential. Being calm and helping reorient the person is also of importance.  Most seizures are painless and end spontaneously. Seizures are not harmful to others but can lead to complications such as stress on the lungs, brain and the heart. Individuals with prior lung problems may develop labored breathing and respiratory distress.     Orders Placed This Encounter  Procedures   10-Hydroxycarbazepine   Basic Metabolic Panel     No orders of the defined types were placed in this encounter.    Return in about 6 months (around 05/07/2024).    Windell Norfolk,  MD 11/05/2023, 12:39 PM  [facility] [address], [address] [place], [place] [address] [phone]

## 2023-11-08 LAB — BASIC METABOLIC PANEL
BUN/Creatinine Ratio: 35 — ABNORMAL HIGH (ref 9–23)
BUN: 20 mg/dL (ref 6–20)
CO2: 21 mmol/L (ref 20–29)
Calcium: 9.3 mg/dL (ref 8.7–10.2)
Chloride: 106 mmol/L (ref 96–106)
Creatinine, Ser: 0.57 mg/dL (ref 0.57–1.00)
Glucose: 79 mg/dL (ref 70–99)
Potassium: 4.3 mmol/L (ref 3.5–5.2)
Sodium: 142 mmol/L (ref 134–144)
eGFR: 130 mL/min/{1.73_m2} (ref >59)

## 2023-11-08 LAB — 10-HYDROXYCARBAZEPINE: Oxcarbazepine SerPl-Mcnc: 18 ug/mL (ref 10–35)

## 2023-11-11 ENCOUNTER — Encounter: Payer: Self-pay | Admitting: Neurology

## 2023-11-14 ENCOUNTER — Emergency Department (HOSPITAL_COMMUNITY)
Admission: EM | Admit: 2023-11-14 | Discharge: 2023-11-15 | Disposition: A | Discharge status: Home / Self Care | Attending: Emergency Medicine | Admitting: Emergency Medicine

## 2023-11-14 ENCOUNTER — Emergency Department (HOSPITAL_COMMUNITY)

## 2023-11-14 ENCOUNTER — Ambulatory Visit: Admission: EM | Admit: 2023-11-14 | Discharge: 2023-11-14 | Disposition: A | Discharge status: Home / Self Care

## 2023-11-14 ENCOUNTER — Other Ambulatory Visit: Payer: Self-pay

## 2023-11-14 DIAGNOSIS — G40409 Other generalized epilepsy and epileptic syndromes, not intractable, without status epilepticus: Secondary | ICD-10-CM

## 2023-11-14 DIAGNOSIS — Z91040 Latex allergy status: Secondary | ICD-10-CM | POA: Insufficient documentation

## 2023-11-14 DIAGNOSIS — R404 Transient alteration of awareness: Secondary | ICD-10-CM | POA: Diagnosis not present

## 2023-11-14 DIAGNOSIS — R112 Nausea with vomiting, unspecified: Secondary | ICD-10-CM | POA: Diagnosis not present

## 2023-11-14 DIAGNOSIS — R079 Chest pain, unspecified: Secondary | ICD-10-CM | POA: Diagnosis not present

## 2023-11-14 DIAGNOSIS — R569 Unspecified convulsions: Secondary | ICD-10-CM | POA: Insufficient documentation

## 2023-11-14 DIAGNOSIS — R059 Cough, unspecified: Secondary | ICD-10-CM | POA: Diagnosis not present

## 2023-11-14 DIAGNOSIS — R000 Tachycardia, unspecified: Secondary | ICD-10-CM | POA: Diagnosis not present

## 2023-11-14 DIAGNOSIS — R55 Syncope and collapse: Secondary | ICD-10-CM | POA: Diagnosis not present

## 2023-11-14 LAB — COMPREHENSIVE METABOLIC PANEL
ALT: 10 U/L (ref 0–44)
AST: 13 U/L — ABNORMAL LOW (ref 15–41)
Albumin: 3.9 g/dL (ref 3.5–5.0)
Alkaline Phosphatase: 35 U/L — ABNORMAL LOW (ref 38–126)
Anion gap: 8 (ref 5–15)
BUN: 11 mg/dL (ref 6–20)
CO2: 23 mmol/L (ref 22–32)
Calcium: 8.9 mg/dL (ref 8.9–10.3)
Chloride: 107 mmol/L (ref 98–111)
Creatinine, Ser: 0.66 mg/dL (ref 0.44–1.00)
GFR, Estimated: >60 mL/min (ref >60)
Glucose, Bld: 94 mg/dL (ref 70–99)
Potassium: 3.8 mmol/L (ref 3.5–5.1)
Sodium: 138 mmol/L (ref 135–145)
Total Bilirubin: 0.3 mg/dL (ref 0.0–1.2)
Total Protein: 6.8 g/dL (ref 6.5–8.1)

## 2023-11-14 LAB — CBC WITH DIFFERENTIAL/PLATELET
Abs Immature Granulocytes: 0 10*3/uL (ref 0.00–0.07)
Basophils Absolute: 0 10*3/uL (ref 0.0–0.1)
Basophils Relative: 1 %
Eosinophils Absolute: 0 10*3/uL (ref 0.0–0.5)
Eosinophils Relative: 1 %
HCT: 35.1 % — ABNORMAL LOW (ref 36.0–46.0)
Hemoglobin: 11.4 g/dL — ABNORMAL LOW (ref 12.0–15.0)
Immature Granulocytes: 0 %
Lymphocytes Relative: 27 %
Lymphs Abs: 1.1 10*3/uL (ref 0.7–4.0)
MCH: 29.6 pg (ref 26.0–34.0)
MCHC: 32.5 g/dL (ref 30.0–36.0)
MCV: 91.2 fL (ref 80.0–100.0)
Monocytes Absolute: 0.3 10*3/uL (ref 0.1–1.0)
Monocytes Relative: 8 %
Neutro Abs: 2.5 10*3/uL (ref 1.7–7.7)
Neutrophils Relative %: 63 %
Platelets: 273 10*3/uL (ref 150–400)
RBC: 3.85 MIL/uL — ABNORMAL LOW (ref 3.87–5.11)
RDW: 13.2 % (ref 11.5–15.5)
WBC: 4 10*3/uL (ref 4.0–10.5)
nRBC: 0 % (ref 0.0–0.2)

## 2023-11-14 NOTE — ED Triage Notes (Signed)
 Patient arrived with EMS from an urgent care , brief grand mal seizure this evening , no injuries , alert and oriented at arrival /respirations unlabored. She takes anti seizure medication .

## 2023-11-14 NOTE — ED Provider Notes (Signed)
 [name]    CSN: [id number] Arrival date & time: 11/14/23  1719      History   Chief Complaint No chief complaint on file.   HPI [name] is a 23 y.o. female.   Patient initially presented to our clinic and checked in for evaluation of URI symptoms and STD screening.  She had not been evaluated by clinic or triaged before she was brought back to a room where she accompanied her grandmother.  Her grandmother who was evaluated by another provider and found to have a new oxygen requirement so was sent to the emergency room by EMS.  While awaiting EMS transport, patient was upset and contacted several family members.  A few minutes later, grandmother came to the door yelling and requesting help and patient was found to be actively seizing with generalized tonic-clonic movements.  Active seizing lasted for approximately 3 to 4 minutes.  She does have a history of seizures and is followed by neurology.  She is currently prescribed oxcarbazepine.    Past Medical History:  Diagnosis Date   Seizure (HCC)    Seizures (HCC)    Well child check 04/26/2011    Patient Active Problem List   Diagnosis Date Noted   Birth control counseling 12/03/2021   LGSIL on Pap smear of cervix 12/03/2021   Routine screening for STI (sexually transmitted infection) 07/09/2021   Brief psychotic disorder (HCC)    Hypokalemia    Seizure-like activity (HCC) 01/28/2021   Anemia    Strep throat    Encounter for Nexplanon removal 11/04/2020   Family history of brain cancer 05/20/2015   Seizures (HCC) 10/31/2006    Past Surgical History:  Procedure Laterality Date   LEG SURGERY      OB History   No obstetric history on file.      Home Medications    Prior to Admission medications   Medication Sig Start Date End Date Taking? Authorizing Provider  amoxicillin-clavulanate (AUGMENTIN) 875-125 MG tablet Take 1 tablet by mouth every 12 (twelve) hours. Patient not taking: Reported on  11/05/2023 04/25/23   Tomi Bamberger, [name]  Oxcarbazepine (TRILEPTAL) 300 MG tablet TAKE 1 TABLET BY MOUTH 2 TIMES DAILY. 06/04/23   Windell Norfolk, MD  loratadine (CLARITIN) 10 MG tablet Take 1 tablet (10 mg total) by mouth daily. 10/15/18 01/28/21  Nestor Ramp, MD    Family History Family History  Problem Relation Age of Onset   Healthy Mother    Seizures Father    Seizures Sister     Social History Social History   Tobacco Use   Smoking status: Never   Smokeless tobacco: Never  Vaping Use   Vaping status: Never Used  Substance Use Topics   Alcohol use: No   Drug use: No     Allergies   Lamotrigine, Codeine, Latex, Levetiracetam, and Tdap [tetanus-diphth-acell pertussis]   Review of Systems Review of Systems   Physical Exam Triage Vital Signs ED Triage Vitals  Encounter Vitals Group     BP --      Systolic BP Percentile --      Diastolic BP Percentile --      Pulse Rate 11/14/23 1846 (!) 123     Resp --      Temp --      Temp Source 11/14/23 1846 Oral     SpO2 11/14/23 1806 98 %     Weight --      Height --  Head Circumference --      Peak Flow --      Pain Score --      Pain Loc --      Pain Education --      Exclude from Growth Chart --    No data found.  Updated Vital Signs Pulse (!) 123   SpO2 98%   Visual Acuity Right Eye Distance:   Left Eye Distance:   Bilateral Distance:    Right Eye Near:   Left Eye Near:    Bilateral Near:     Physical Exam Cardiovascular:     Rate and Rhythm: Normal rate and regular rhythm.     Heart sounds: Normal heart sounds, S1 normal and S2 normal. No murmur heard. Neurological:     Motor: Seizure activity present.      UC Treatments / Results  Labs (all labs ordered are listed, but only abnormal results are displayed) Labs Reviewed - No data to display  EKG   Radiology No results found.  Procedures Procedures (including critical care time)  Medications Ordered in UC Medications - No  data to display  Initial Impression / Assessment and Plan / UC Course  I have reviewed the triage vital signs and the nursing notes.  Pertinent labs & imaging results that were available during my care of the patient were reviewed by me and considered in my medical decision making (see chart for details).     Patient had 3 to 4 minutes of witnessed generalized tonic-clonic seizure activity.  She maintained pulse and respirations.  She did arouse with ammonia tablet with incoherent verbal utterances but would not follow commands or respond to sternal rub.  CBG was obtained that was 88.  EMS came to transport patient at which point she was no longer seizing and in a postictal state.  She was transported to [facility] for further evaluation and management by [facility].  Final Clinical Impressions(s) / UC Diagnoses   Final diagnoses:  Tonic-clonic seizure [facility])   Discharge Instructions   None    ED Prescriptions   None    PDMP not reviewed this encounter.   Jeani Hawking, [name] 11/14/23 2206

## 2023-11-14 NOTE — ED Provider Triage Note (Signed)
 Emergency Medicine Provider Triage Evaluation Note  Miaya Lafontant , a 23 y.o. female  was evaluated in triage.  Pt complains of seizure.  Patient states that she was at a doctor's appointment when she got upset and believes she had a seizure.  Denies hitting her head.  She states that she has seizures when she is upset.  Currently taking Trileptal for seizures.  Denies any missed doses.  Endorses generalized aches.  She is concerned she might have a upper respiratory infection and would like to get tested for that at this time.  Review of Systems  Positive: Seizure, body aches Negative: Head injury  Physical Exam  BP 120/78 (BP Location: Right Arm)   Pulse 98   Temp 98.5 F (36.9 C) (Oral)   Resp 16   SpO2 100%  Gen:   Awake, no distress   Resp:  Normal effort  MSK:   Moves extremities without difficulty  Other:    Medical Decision Making  Medically screening exam initiated at 7:55 PM.  Appropriate orders placed.  Shanice Poznanski was informed that the remainder of the evaluation will be completed by another provider, this initial triage assessment does not replace that evaluation, and the importance of remaining in the ED until their evaluation is complete.   Maxwell Marion, [name] 11/14/23 1958

## 2023-11-14 NOTE — ED Notes (Addendum)
 Pt was awake and oriented in lobby waiting to be seen for vomiting, body aches, and STI testing. Pt was called back to room 1 to sit with her grandmother (also being seen at [facility] as pt) as she required transport to hospital via EMS. Pt stepped out to call her family members as she did not want to be at hospital alone with family member. Pt became distressed on verge of tears while on phone. Stepped back into room to sit with grandmother while [name], CMA monitored her. Pt began actively seizing in chair for total of 3 mins witnessed. Raspet, [name], called in at beginning of seizure. Allergies, medications, and medical history reviewed with grandmother in room. Hx of seizures. Pt maintained pulse and respirations throughout this time. Pulse ranged from 110-133. Respirations 10-22. Pulse ox readings: 97-99%. CBG: 88. In postictal state, no response to sternal rub. Pt aroused with use of ammonia towelette. Began mumbling, but would not open eyes. EMS arrived on site.

## 2023-11-14 NOTE — ED Notes (Signed)
 Patient is being discharged from the Urgent Care and sent to the Emergency Department via EMS . Per Raspet, [name], patient is in need of higher level of care due to witnessed seizure on site. Patient is aware and verbalizes understanding of plan of care.  Vitals:   11/14/23 1806 11/14/23 1846  Pulse:  (!) 123  SpO2: 98% 98%

## 2023-11-15 LAB — RESP PANEL BY RT-PCR (RSV, FLU A&B, COVID)  RVPGX2
Influenza A by PCR: NEGATIVE
Influenza B by PCR: NEGATIVE
Resp Syncytial Virus by PCR: NEGATIVE
SARS Coronavirus 2 by RT PCR: NEGATIVE

## 2023-11-15 MED ORDER — ONDANSETRON 4 MG PO TBDP: 4.0000 mg | ORAL_TABLET | Freq: Three times a day (TID) | ORAL | 0 refills | Status: DC | PRN

## 2023-11-15 NOTE — Discharge Instructions (Addendum)
 Labs and CXR today were normal.  We have sent covid/flu test, you can follow up the result on mychart. Recommend to follow-up with your neurologist. Return to the ED for new or worsening symptoms.

## 2023-11-15 NOTE — ED Provider Notes (Signed)
 [place] EMERGENCY DEPARTMENT AT [facility] Provider Note   CSN: [id number] Arrival date & time: 11/14/23  1916     History  Chief Complaint  Patient presents with   Seizures    Kare Easler is a 23 y.o. female.  The history is provided by the patient and medical records.  Seizures  23 year old female with history of anemia, seizures, presenting to the ED from urgent care after witnessed seizure there.  She was being seen at urgent care for URI symptoms along with nausea and vomiting over the past few days.  Grandmother also sick with similar.  She did not have any testing done at urgent care and was transferred here for further evaluation.  Seizure self terminated, lasted approximately 3 to 4 minutes, described as tonic-clonic.  There was no head injury.  Patient currently feeling back to baseline and states she is ready to go home.  Home Medications Prior to Admission medications   Medication Sig Start Date End Date Taking? Authorizing Provider  amoxicillin-clavulanate (AUGMENTIN) 875-125 MG tablet Take 1 tablet by mouth every 12 (twelve) hours. Patient not taking: Reported on 11/05/2023 04/25/23   Tomi Bamberger, [name]  Oxcarbazepine (TRILEPTAL) 300 MG tablet TAKE 1 TABLET BY MOUTH 2 TIMES DAILY. 06/04/23   Windell Norfolk, MD  loratadine (CLARITIN) 10 MG tablet Take 1 tablet (10 mg total) by mouth daily. 10/15/18 01/28/21  Nestor Ramp, MD      Allergies    Lamotrigine, Codeine, Latex, Levetiracetam, and Tdap [tetanus-diphth-acell pertussis]    Review of Systems   Review of Systems  Neurological:  Positive for seizures.  All other systems reviewed and are negative.   Physical Exam Updated Vital Signs BP (!) 138/111 (BP Location: Right Arm)   Pulse 98   Temp 98 F (36.7 C)   Resp 18   SpO2 100%   Physical Exam Vitals and nursing note reviewed.  Constitutional:      Appearance: She is well-developed.  HENT:     Head: Normocephalic and atraumatic.      Comments: No visible head trauma Eyes:     Conjunctiva/sclera: Conjunctivae normal.     Pupils: Pupils are equal, round, and reactive to light.  Cardiovascular:     Rate and Rhythm: Normal rate and regular rhythm.     Heart sounds: Normal heart sounds.  Pulmonary:     Effort: Pulmonary effort is normal.     Breath sounds: Normal breath sounds.  Abdominal:     General: Bowel sounds are normal.     Palpations: Abdomen is soft.  Musculoskeletal:        General: Normal range of motion.     Cervical back: Normal range of motion.  Skin:    General: Skin is warm and dry.  Neurological:     Mental Status: She is alert and oriented to person, place, and time.     Comments: AAOx3, answering questions and following commands appropriately; equal strength UE and LE bilaterally; CN grossly intact; moves all extremities appropriately without ataxia; no focal neuro deficits or facial asymmetry appreciated  Psychiatric:     Comments: Seems agitated with simple questioning     ED Results / Procedures / Treatments   Labs (all labs ordered are listed, but only abnormal results are displayed) Labs Reviewed  CBC WITH DIFFERENTIAL/PLATELET - Abnormal; Notable for the following components:      Result Value   RBC 3.85 (*)    Hemoglobin 11.4 (*)  HCT 35.1 (*)    All other components within normal limits  COMPREHENSIVE METABOLIC PANEL - Abnormal; Notable for the following components:   AST 13 (*)    Alkaline Phosphatase 35 (*)    All other components within normal limits  RESP PANEL BY RT-PCR (RSV, FLU A&B, COVID)  RVPGX2  URINALYSIS, ROUTINE W REFLEX MICROSCOPIC  CBG MONITORING, ED  POC URINE PREG, ED    EKG None  Radiology DG Chest 2 View Result Date: 11/14/2023 CLINICAL DATA:  Cough EXAM: CHEST - 2 VIEW COMPARISON:  Chest x-ray 07/26/2022 FINDINGS: The heart size and mediastinal contours are within normal limits. Both lungs are clear. The visualized skeletal structures are unremarkable.  IMPRESSION: No active cardiopulmonary disease. Electronically Signed   By: Darliss Cheney M.D.   On: 11/14/2023 22:39    Procedures Procedures    Medications Ordered in ED Medications - No data to display  ED Course/ Medical Decision Making/ A&P                                 Medical Decision Making Amount and/or Complexity of Data Reviewed Labs: ordered. Radiology: ordered and independent interpretation performed. ECG/medicine tests: ordered and independent interpretation performed.  Risk Prescription drug management.   23 year old female presenting to the ED after witnessed seizure at urgent care.  She was been evaluated there for URI symptoms along with nausea and vomiting.  Grandmother sick with similar.  She is afebrile and nontoxic in appearance here.  She is quite agitated with simple questioning.  Expressed frustration about being in the ER at all.  She does not have any focal neurologic deficits.  She had labs obtained which are grossly reassuring without leukocytosis or electrolyte derangement.  Chest x-ray is clear.  Patient expresses that she is ready to go home.  Will send RVP, can follow-up results in her MyChart.  Nausea medication sent to pharmacy, encouraged to rest and hydrate.  Can follow-up with PCP.  Also encouraged to follow-up with her neurologist..  We discussed driving precautions, grandmother at bedside reports she does not drive at baseline.  She can return here for new concerns.  Final Clinical Impression(s) / ED Diagnoses Final diagnoses:  Seizure (HCC)  Nausea and vomiting, unspecified vomiting type    Rx / DC Orders ED Discharge Orders          Ordered    ondansetron (ZOFRAN-ODT) 4 MG disintegrating tablet  Every 8 hours PRN        11/15/23 0056              Garlon Hatchet, [name] 11/15/23 0249    Zadie Rhine, MD 11/15/23 [phone]

## 2023-12-24 ENCOUNTER — Ambulatory Visit
Admission: EM | Admit: 2023-12-24 | Discharge: 2023-12-24 | Disposition: A | Discharge status: Home / Self Care | Attending: Family Medicine | Admitting: Family Medicine

## 2023-12-24 DIAGNOSIS — Z711 Person with feared health complaint in whom no diagnosis is made: Secondary | ICD-10-CM | POA: Diagnosis not present

## 2023-12-24 DIAGNOSIS — Z113 Encounter for screening for infections with a predominantly sexual mode of transmission: Secondary | ICD-10-CM | POA: Insufficient documentation

## 2023-12-24 DIAGNOSIS — N760 Acute vaginitis: Secondary | ICD-10-CM | POA: Diagnosis not present

## 2023-12-24 DIAGNOSIS — Z3202 Encounter for pregnancy test, result negative: Secondary | ICD-10-CM | POA: Diagnosis present

## 2023-12-24 DIAGNOSIS — B9689 Other specified bacterial agents as the cause of diseases classified elsewhere: Secondary | ICD-10-CM | POA: Diagnosis not present

## 2023-12-24 DIAGNOSIS — N390 Urinary tract infection, site not specified: Secondary | ICD-10-CM | POA: Diagnosis not present

## 2023-12-24 DIAGNOSIS — J029 Acute pharyngitis, unspecified: Secondary | ICD-10-CM | POA: Diagnosis present

## 2023-12-24 LAB — POCT URINALYSIS DIP (MANUAL ENTRY)
Bilirubin, UA: NEGATIVE
Blood, UA: NEGATIVE
Glucose, UA: NEGATIVE mg/dL
Ketones, POC UA: NEGATIVE mg/dL
Nitrite, UA: POSITIVE — AB
Protein Ur, POC: 30 mg/dL — AB
Spec Grav, UA: 1.025
Urobilinogen, UA: 1 U/dL
pH, UA: 7

## 2023-12-24 LAB — POCT RAPID STREP A (OFFICE): Rapid Strep A Screen: NEGATIVE

## 2023-12-24 LAB — POCT URINE PREGNANCY: Preg Test, Ur: NEGATIVE

## 2023-12-24 MED ORDER — ONDANSETRON HCL 4 MG PO TABS: 4.0000 mg | ORAL_TABLET | Freq: Three times a day (TID) | ORAL | 0 refills | Status: DC | PRN

## 2023-12-24 MED ORDER — CEPHALEXIN 500 MG PO CAPS: 500.0000 mg | ORAL_CAPSULE | Freq: Two times a day (BID) | ORAL | 0 refills | Status: AC

## 2023-12-24 NOTE — ED Provider Notes (Signed)
 [name]    CSN: [id number] Arrival date & time: 12/24/23  1444      History   Chief Complaint Chief Complaint  Patient presents with   Sore Throat   SEXUALLY TRANSMITTED DISEASE    Testing   Nausea   Possible Pregnancy    HPI [name] is a 23 y.o. female.   STD Screening  Patient here for TD testing.  Patient reports she engaged in unprotected sex approximately 2 days ago and is concerned that she may have an STD.  She is also experiencing some sore throat and is concerned that that may be related to an STD and would like a throat swab.  Also concerned about a possible pregnancy despite having her last menstrual period on 12/14/2023.  Patient endorses nausea without vomiting.  Has a low-grade fever on arrival 99.6.  The patient she is not experiencing any dysuria or vaginal irritation.   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      Home Medications    Prior to Admission medications   Medication Sig Start Date End Date Taking? Authorizing Provider  cephALEXin  (KEFLEX ) 500 MG capsule Take 1 capsule (500 mg total) by mouth 2 (two) times daily for 5 days. 12/24/23 12/29/23 Yes Buena Carmine, NP  dextromethorphan-guaiFENesin  (MUCINEX  DM) 30-600 MG 12hr tablet Take 1 tablet by mouth 2 (two) times daily as needed. 06/13/22  Yes  [provider]  fluticasone  (FLONASE ) 50 MCG/ACT nasal spray Place 2 sprays into both nostrils daily. 06/13/22  Yes [provider]  ondansetron  (ZOFRAN ) 4 MG tablet Take 1 tablet (4 mg total) by mouth every 8 (eight) hours as needed for nausea or vomiting. 12/24/23  Yes Buena Carmine, NP  Oxcarbazepine  (TRILEPTAL ) 300 MG tablet TAKE 1 TABLET BY MOUTH 2 TIMES DAILY. 06/04/23  Yes Cassandra Cleveland, MD  metroNIDAZOLE  (FLAGYL ) 500 MG tablet Take 1 tablet (500 mg total) by mouth 2 (two) times daily. 12/26/23   Buena Carmine, NP  loratadine  (CLARITIN ) 10 MG tablet Take 1 tablet (10 mg total) by mouth daily. 10/15/18 01/28/21  Charise Companion, MD    Family History Family History  Problem Relation Age of Onset   Healthy Mother    Seizures Father    Seizures Sister     Social History Social History   Tobacco Use   Smoking status: Never   Smokeless tobacco: Never  Vaping Use   Vaping status: Never Used  Substance Use Topics   Alcohol use: No   Drug use: No     Allergies   Lamotrigine , Codeine , Latex, Levetiracetam , and Tdap [tetanus-diphth-acell pertussis]   Review of Systems Review of Systems Pertinent negatives listed in HPI   Physical Exam Triage Vital Signs ED Triage  Vitals  Encounter Vitals Group     BP 12/24/23 1537 101/68     Systolic BP Percentile --      Diastolic BP Percentile --      Pulse Rate 12/24/23 1537 98     Resp 12/24/23 1537 20     Temp 12/24/23 1537 99.6 F (37.6 C)     Temp Source 12/24/23 1537 Oral     SpO2 12/24/23 1537 100 %     Weight 12/24/23 1534 108 lb (49 kg)     Height 12/24/23 1534 5\' 5"  (1.651 m)     Head Circumference --      Peak Flow --      Pain Score 12/24/23 1531 0     Pain Loc --      Pain Education --      Exclude from Growth Chart --    No data found.  Updated Vital Signs BP 101/68 (BP Location: Left Arm)   Pulse 98   Temp 99.6 F (37.6 C) (Oral)   Resp 20   Ht 5\' 5"  (1.651 m)   Wt 108 lb (49 kg)    LMP 12/14/2023 (Exact Date)   SpO2 100%   BMI 17.97 kg/m   Visual Acuity Right Eye Distance:   Left Eye Distance:   Bilateral Distance:    Right Eye Near:   Left Eye Near:    Bilateral Near:     Physical Exam General appearance:Alert, well developed, well nourished, cooperative  Head: Normocephalic, without obvious abnormality, atraumatic Heart:Rate and rhythm normal.  Respiratory: Respirations even and unlabored, normal respiratory rate  CVA:  Negative flank pain Extremities: No gross deformities Skin: Skin color, texture, turgor normal. No rashes seen  Psych: Appropriate mood and affect.  UC Treatments / Results  Labs (all labs ordered are listed, but only abnormal results are displayed) Labs Reviewed  URINE CULTURE - Abnormal; Notable for the following components:      Result Value   Culture >=100,000 COLONIES/mL ESCHERICHIA COLI (*)    Organism ID, Bacteria ESCHERICHIA COLI (*)    All other components within normal limits  POCT URINALYSIS DIP (MANUAL ENTRY) - Abnormal; Notable for the following components:   Clarity, UA cloudy (*)    Protein Ur, POC =30 (*)    Nitrite, UA Positive (*)    Leukocytes, UA Small (1+) (*)    All other components within normal limits  CERVICOVAGINAL ANCILLARY ONLY - Abnormal; Notable for the following components:   Bacterial Vaginitis (gardnerella) Positive (*)    All other components within normal limits  POCT URINE PREGNANCY - Normal  POCT RAPID STREP A (OFFICE) - Normal  RPR   Narrative:    Performed at:  [address] [employer] [address] [employer], [place]  [id number] Lab Director: Pearlean Botts MD, Phone:  [phone]  HIV ANTIBODY (ROUTINE TESTING W REFLEX)  CYTOLOGY, (ORAL, ANAL, URETHRAL) ANCILLARY ONLY    EKG   Radiology No results found.  Procedures Procedures (including critical care time)  Medications Ordered in UC Medications - No data to display  Initial Impression / Assessment and Plan / UC Course  I  have reviewed the triage vital signs and the nursing notes.  Pertinent labs & imaging results that were available during my care of the patient were reviewed by me and considered in my medical decision making (see chart for details).    Rapid strep test is negative.   Cytology pending or STIs.  UA concerning for possible  pyelonephritis given patient is febrile with nausea.  Will cover empirically with Keflex  500 mg twice daily while awaiting urine culture results.  Urine pregnancy negative.  HIV/RPR pending.  Patient educated that STD testing should be repeated within the next 3 weeks as she could possibly have false negative results given that sexual intercourse only took place 48 hours ago.  Patient also informed that if her partner advises of any exposure or diagnosis of STIs she is to return for treatment and evaluation. Final Clinical Impressions(s) / UC Diagnoses   Final diagnoses:  Concern about STD in female without diagnosis  Acute UTI     Discharge Instructions      Results will be available within 3 to 4 days.  Our office will only reach out if any treatment is needed.  Have a urinary tract infection you have been treated with Keflex  500 mg twice daily for 5 days.  I have ordered a urine culture that will verify that the current medication that you are being treated with will resolve your urinary tract infection.  If any changes in treatment are needed we will reach out to you by phone if unable to reach by phone we will send you a MyChart message.  I have also sent you over Zofran  for nausea.  If you develop any fever or severe abdominal pain persistent vomiting that does not resolve with the medication you are being treated with go to the nearest emergency department.     ED Prescriptions     Medication Sig Dispense Auth. Provider   cephALEXin  (KEFLEX ) 500 MG capsule Take 1 capsule (500 mg total) by mouth 2 (two) times daily for 5 days. 10 capsule Buena Carmine, NP    ondansetron  (ZOFRAN ) 4 MG tablet Take 1 tablet (4 mg total) by mouth every 8 (eight) hours as needed for nausea or vomiting. 20 tablet Buena Carmine, NP      PDMP not reviewed this encounter.   Buena Carmine, NP 12/27/23 1258

## 2023-12-24 NOTE — ED Triage Notes (Signed)
"  My throat hurts". "I had went out of town and had unprotected sex, I need to see if I am pregnant, I also want STI testing (Oral, Blood and Vaginal Swab)". "Also, I have had nausea that started a few days after being out of town and continues on/off". No vomiting. No abd pain.

## 2023-12-24 NOTE — Discharge Instructions (Addendum)
 Results will be available within 3 to 4 days.  Our office will only reach out if any treatment is needed.  Have a urinary tract infection you have been treated with Keflex  500 mg twice daily for 5 days.  I have ordered a urine culture that will verify that the current medication that you are being treated with will resolve your urinary tract infection.  If any changes in treatment are needed we will reach out to you by phone if unable to reach by phone we will send you a MyChart message.  I have also sent you over Zofran  for nausea.  If you develop any fever or severe abdominal pain persistent vomiting that does not resolve with the medication you are being treated with go to the nearest emergency department.

## 2023-12-25 ENCOUNTER — Encounter: Payer: Self-pay | Admitting: Family Medicine

## 2023-12-25 LAB — CERVICOVAGINAL ANCILLARY ONLY
Bacterial Vaginitis (gardnerella): POSITIVE — AB
Chlamydia: NEGATIVE
Comment: NEGATIVE
Comment: NEGATIVE
Comment: NEGATIVE
Comment: NORMAL
Neisseria Gonorrhea: NEGATIVE
Trichomonas: NEGATIVE

## 2023-12-25 LAB — CYTOLOGY, (ORAL, ANAL, URETHRAL) ANCILLARY ONLY
Chlamydia: NEGATIVE
Comment: NEGATIVE
Comment: NORMAL
Neisseria Gonorrhea: NEGATIVE

## 2023-12-25 LAB — RPR: RPR Ser Ql: NONREACTIVE

## 2023-12-26 ENCOUNTER — Encounter: Payer: Self-pay | Admitting: Family Medicine

## 2023-12-26 LAB — URINE CULTURE
Culture: >=100000 — AB
Special Requests: NORMAL

## 2023-12-26 MED ORDER — METRONIDAZOLE 500 MG PO TABS: 500.0000 mg | ORAL_TABLET | Freq: Two times a day (BID) | ORAL | 0 refills | Status: DC

## 2023-12-27 LAB — HIV ANTIBODY (ROUTINE TESTING W REFLEX): HIV Screen 4th Generation wRfx: NONREACTIVE

## 2024-02-14 ENCOUNTER — Ambulatory Visit
Admission: EM | Admit: 2024-02-14 | Discharge: 2024-02-14 | Disposition: A | Discharge status: Home / Self Care | Attending: Physician Assistant | Admitting: Physician Assistant

## 2024-02-14 ENCOUNTER — Encounter: Payer: Self-pay | Admitting: Emergency Medicine

## 2024-02-14 DIAGNOSIS — R300 Dysuria: Secondary | ICD-10-CM

## 2024-02-14 DIAGNOSIS — N760 Acute vaginitis: Secondary | ICD-10-CM

## 2024-02-14 DIAGNOSIS — Z113 Encounter for screening for infections with a predominantly sexual mode of transmission: Secondary | ICD-10-CM | POA: Diagnosis not present

## 2024-02-14 DIAGNOSIS — M545 Low back pain, unspecified: Secondary | ICD-10-CM | POA: Diagnosis not present

## 2024-02-14 DIAGNOSIS — N898 Other specified noninflammatory disorders of vagina: Secondary | ICD-10-CM | POA: Diagnosis present

## 2024-02-14 LAB — POCT URINALYSIS DIP (MANUAL ENTRY)
Bilirubin, UA: NEGATIVE
Blood, UA: NEGATIVE
Glucose, UA: NEGATIVE mg/dL
Ketones, POC UA: NEGATIVE mg/dL
Leukocytes, UA: NEGATIVE
Nitrite, UA: NEGATIVE
Spec Grav, UA: >=1.03 — AB (ref 1.010–1.025)
Urobilinogen, UA: 0.2 U/dL
pH, UA: 6 (ref 5.0–8.0)

## 2024-02-14 LAB — POCT URINE PREGNANCY: Preg Test, Ur: NEGATIVE

## 2024-02-14 MED ORDER — NAPROXEN 375 MG PO TABS: 375.0000 mg | ORAL_TABLET | Freq: Two times a day (BID) | ORAL | 0 refills | Status: DC

## 2024-02-14 MED ORDER — FLUCONAZOLE 150 MG PO TABS: 150.0000 mg | ORAL_TABLET | Freq: Once | ORAL | 0 refills | Status: AC

## 2024-02-14 MED ORDER — LIDOCAINE 5 % EX PTCH: 1.0000 | MEDICATED_PATCH | CUTANEOUS | 0 refills | Status: DC

## 2024-02-14 NOTE — ED Triage Notes (Signed)
 Pt here with several concerns. Emesis episodes that started yesterday morning (2 episodes total). Denies nausea and diarrhea. Not currently nauseous. Pt notes that the emesis episodes have exacerbated her chronic headaches and low back pain.   Would also like to complete STI testing (cytology and blood work). Pt notes dysuria with urination, burning sensation, and itching that started yesterday.

## 2024-02-14 NOTE — ED Provider Notes (Signed)
 [name]    CSN: [id number] Arrival date & time: 02/14/24  1451      History   Chief Complaint Chief Complaint  Patient presents with   Dysuria   Exposure to STD   Emesis    HPI [name] is a 23 y.o. female.   Patient presents today with several concerns.  She reports that for the past several days she has had UTI symptoms which she describes as frequency as well as burning sensation.  She reports this is more when the urine touches her skin.  She has also developed vaginal discharge which she describes as thick white discharge.  She was seen and treated a few months ago for UTI but denies additional antibiotics since that time.  She does not believe that she could be pregnant but is open to testing.  She has had some nausea with 1 episode of vomiting.  She is also experiencing lower back pain but states that this is chronic.  She has not been taking any over-the-counter medication for symptom management.  She denies any recent falls or changes in her physical activity.  She is sexually active and so is requesting a complete STI panel.  She denies any history of diabetes and does not take SGLT2 inhibitor.    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      Home Medications    Prior to Admission medications   Medication Sig Start Date  End Date Taking? Authorizing Provider  fluconazole  (DIFLUCAN ) 150 MG tablet Take 1 tablet (150 mg total) by mouth once for 1 dose. 02/14/24 02/14/24 Yes Ramadan Couey K, [name]  lidocaine  (LIDODERM ) 5 % Place 1 patch onto the skin daily. Remove & Discard patch within 12 hours or as directed by MD 02/14/24  Yes Chiante Peden, Betsey Brow, [name]  naproxen  (NAPROSYN ) 375 MG tablet Take 1 tablet (375 mg total) by mouth 2 (two) times daily. 02/14/24  Yes Jorryn Casagrande K, [name]  Oxcarbazepine  (TRILEPTAL ) 300 MG tablet TAKE 1 TABLET BY MOUTH 2 TIMES DAILY. 06/04/23  Yes Camara, Nancye Azure, MD  dextromethorphan-guaiFENesin  (MUCINEX  DM) 30-600 MG 12hr tablet Take 1 tablet by mouth 2 (two) times daily as needed. Patient not taking: Reported on 02/14/2024 06/13/22   [provider]  fluticasone  (FLONASE ) 50 MCG/ACT nasal spray Place 2 sprays into both nostrils daily. 06/13/22   [provider]  ondansetron  (ZOFRAN ) 4 MG tablet Take 1 tablet (4 mg total) by mouth every 8 (eight) hours as needed for nausea or vomiting. Patient not taking: Reported on 02/14/2024 12/24/23   Buena Carmine, NP  loratadine  (CLARITIN ) 10 MG tablet Take 1 tablet (10 mg total) by mouth daily. 10/15/18 01/28/21  Charise Companion, MD    Family History Family History  Problem Relation Age of Onset   Healthy Mother    Seizures Father    Seizures Sister     Social History Social History   Tobacco Use   Smoking status: Never   Smokeless tobacco: Never  Vaping Use   Vaping status: Never Used  Substance Use Topics   Alcohol use: No   Drug use: No     Allergies   Lamotrigine , Codeine , Latex, Levetiracetam , and Tdap [tetanus-diphth-acell pertussis]   Review of Systems Review of Systems  Constitutional:  Positive for activity change. Negative for appetite change, fatigue and fever.  Gastrointestinal:  Positive for nausea. Negative for abdominal pain, diarrhea and vomiting.  Genitourinary:  Positive for vaginal discharge. Negative for  dysuria, frequency, urgency, vaginal bleeding and vaginal pain.  Musculoskeletal:  Positive for back pain. Negative for arthralgias and myalgias.     Physical Exam Triage Vital Signs ED Triage Vitals [02/14/24 1526]  Encounter Vitals Group     BP 115/76     Girls Systolic BP Percentile      Girls Diastolic BP Percentile      Boys Systolic BP Percentile      Boys Diastolic BP Percentile      Pulse Rate 82     Resp 14     Temp 99 F (37.2 C)     Temp Source Oral     SpO2 99 %     Weight      Height      Head Circumference      Peak Flow      Pain Score 0     Pain Loc      Pain Education      Exclude from Growth Chart    No data found.  Updated Vital Signs BP 115/76 (BP Location: Left Arm)   Pulse 82   Temp 99 F (37.2 C) (Oral)   Resp 14   LMP 01/13/2024 (Approximate)   SpO2 99%   Visual Acuity Right Eye Distance:   Left Eye Distance:   Bilateral Distance:    Right Eye Near:   Left Eye Near:    Bilateral Near:     Physical Exam Vitals reviewed.  Constitutional:      General: She is awake. She is not in acute distress.    Appearance: Normal appearance. She is well-developed. She is not ill-appearing.     Comments: Very pleasant female presented age in no acute distress sitting comfortably in exam room  HENT:     Head: Normocephalic and atraumatic.   Cardiovascular:     Rate and Rhythm: Normal rate and regular rhythm.     Heart sounds: Normal heart sounds, S1 normal and S2 normal. No murmur heard. Pulmonary:     Effort: Pulmonary effort is normal.     Breath sounds: Normal breath sounds. No wheezing, rhonchi or rales.     Comments: Clear to auscultation bilaterally Abdominal:     General: Bowel sounds are normal.     Palpations: Abdomen is soft.     Tenderness: There is no abdominal tenderness. There is no right CVA tenderness, left CVA tenderness, guarding or rebound.     Comments: Benign abdominal exam   Musculoskeletal:     Cervical back: No  tenderness or bony tenderness.     Thoracic back: No tenderness or bony tenderness.     Lumbar back: Tenderness present. No bony tenderness. Negative right straight leg raise test and negative left straight leg raise test.  Comments: Back: Tenderness palpation of bilateral lumbar paraspinal muscles without spasm or deformity.  No pain percussion of vertebrae.     Psychiatric:        Behavior: Behavior is cooperative.      UC Treatments / Results  Labs (all labs ordered are listed, but only abnormal results are displayed) Labs Reviewed  POCT URINALYSIS DIP (MANUAL ENTRY) - Abnormal; Notable for the following components:      Result Value   Spec Grav, UA >=1.030 (*)    Protein Ur, POC trace (*)    All other components within normal limits  POCT URINE PREGNANCY - Normal  URINE CULTURE  HIV ANTIBODY (ROUTINE TESTING W REFLEX)  RPR  CERVICOVAGINAL ANCILLARY ONLY    EKG   Radiology No results found.  Procedures Procedures (including critical care time)  Medications Ordered in UC Medications - No data to display  Initial Impression / Assessment and Plan / UC Course  I have reviewed the triage vital signs and the nursing notes.  Pertinent labs & imaging results that were available during my care of the patient were reviewed by me and considered in my medical decision making (see chart for details).     Patient is well-appearing, afebrile, nontoxic, nontachycardic.  Urine pregnancy was negative.  UA showed concentrated urine without evidence of UTI.  She does report ongoing dysuria which I think is more related to urine touching irritated vaginal skin from yeast infection, however, we will send this for culture but defer additional antibiotics until culture results are available.  1 dose of Diflucan  was sent to pharmacy and she was encouraged to use hypoallergenic soaps and detergents and wear loosefitting cotton clothing.  STI swab was collected and is pending.  We will  contact her if need to arrange additional treatment.  She requested HIV and syphilis testing which was obtained as well.  I suspect her lower back pain is more musculoskeletal in nature.  She was given lidocaine  to help with pain we discussed that she should only use 1 patch per 24 hours and apply this for only 12 hours at a time.  She was given Naprosyn  for additional pain relief and we discussed that she is not to take NSAIDs with this medication due to risk of GI bleeding.  She can use heat and gentle stretch.  Recommend close follow-up with her primary care given her recurrent and frequent symptoms as she may benefit from seeing a specialist that we do not have access to in urgent care.  We did discuss that if anything worsens or changes she needs to be seen immediately including abdominal pain, pelvic pain, fever, nausea, vomiting.  Strict return precautions given.  Excuse note provided.  Final Clinical Impressions(s) / UC Diagnoses   Final diagnoses:  Vaginal discharge  Acute vaginitis  Screening examination for STI  Acute bilateral low back pain without sciatica  Dysuria     Discharge Instructions      We are treating you for yeast infection.  Take Diflucan  1 time.  If we need to arrange additional treatment we will contact you.  Your urine was concentrated so ensure you are drinking plenty of fluid.  We will send this for culture and I will contact you if need to arrange additional treatment with antibiotics.  I believe your lower back pain is more muscular in nature.  Apply lidocaine  patch during the day and then remove this at night.  Use only 1 patch per 24 hours.  You can take Naprosyn  for pain relief.  Do not take additional NSAIDs with this medication due to risk of GI bleeding.  Eat a bland diet and drink plenty of fluid.  If you have any worsening or changing symptoms including nausea/vomiting, weakness, dizziness, chest pain, shortness of breath, pelvic pain, urinary symptoms you  should be seen immediately.     ED Prescriptions     Medication Sig Dispense Auth. Provider   fluconazole  (DIFLUCAN ) 150 MG tablet Take 1 tablet (150 mg total) by mouth once for 1 dose. 1 tablet Margeaux Swantek K, [name]   lidocaine  (LIDODERM ) 5 % Place 1 patch onto the skin daily. Remove & Discard patch within 12 hours or as directed by MD 7 patch Maple Odaniel K, [name]   naproxen  (NAPROSYN ) 375 MG tablet Take 1 tablet (375 mg total) by mouth 2 (two) times daily. 10 tablet Jolayne Branson K, [name]      PDMP not reviewed this encounter.   Budd Cargo, [name] 02/14/24 1657

## 2024-02-14 NOTE — Discharge Instructions (Signed)
 We are treating you for yeast infection.  Take Diflucan  1 time.  If we need to arrange additional treatment we will contact you.  Your urine was concentrated so ensure you are drinking plenty of fluid.  We will send this for culture and I will contact you if need to arrange additional treatment with antibiotics.  I believe your lower back pain is more muscular in nature.  Apply lidocaine  patch during the day and then remove this at night.  Use only 1 patch per 24 hours.  You can take Naprosyn  for pain relief.  Do not take additional NSAIDs with this medication due to risk of GI bleeding.  Eat a bland diet and drink plenty of fluid.  If you have any worsening or changing symptoms including nausea/vomiting, weakness, dizziness, chest pain, shortness of breath, pelvic pain, urinary symptoms you should be seen immediately.

## 2024-02-15 ENCOUNTER — Ambulatory Visit (INDEPENDENT_AMBULATORY_CARE_PROVIDER_SITE_OTHER)

## 2024-02-15 ENCOUNTER — Ambulatory Visit (HOSPITAL_COMMUNITY)
Admission: EM | Admit: 2024-02-15 | Discharge: 2024-02-15 | Disposition: A | Discharge status: Home / Self Care | Attending: Family Medicine | Admitting: Family Medicine

## 2024-02-15 DIAGNOSIS — M25571 Pain in right ankle and joints of right foot: Secondary | ICD-10-CM

## 2024-02-15 LAB — URINE CULTURE: Culture: NO GROWTH

## 2024-02-15 LAB — HIV ANTIBODY (ROUTINE TESTING W REFLEX): HIV Screen 4th Generation wRfx: NONREACTIVE

## 2024-02-15 LAB — RPR: RPR Ser Ql: NONREACTIVE

## 2024-02-15 MED ORDER — NAPROXEN 375 MG PO TABS: 375.0000 mg | ORAL_TABLET | Freq: Two times a day (BID) | ORAL | 0 refills | Status: DC

## 2024-02-15 NOTE — ED Triage Notes (Signed)
 Patient is here with right ankle pain and swelling after falling today. Patient states she fall backgrounds. No LOC.

## 2024-02-15 NOTE — Discharge Instructions (Addendum)
 X-ray done today of the right ankle.  Final evaluation by the radiologist is still pending but on brief evaluation there does not appear to be any acute fracture.  Once the radiologist is read the x-ray if there is any changes we will contact you.  For now we will use a lace up ankle brace and have you wear this during the day and with any activity for the next 7 to 10 days.  After this you may remove the brace and increase activity as tolerated.  If the pain returns then recommend following up with an orthopedist.  May use naproxen  375 mg twice daily as needed for pain. Ice the area 2-3 times daily for 10-15 minutes to help with pain and swelling. Do not apply ice directly to the skin.

## 2024-02-15 NOTE — ED Provider Notes (Signed)
 [facility]    CSN: [id number] Arrival date & time: 02/15/24  1745      History   Chief Complaint No chief complaint on file.   HPI [name] is a 23 y.o. female.   23 year old female who presents urgent care with complaints of right ankle pain.  This started today after she tripped on the last step walking downstairs with the baby.  She fell backwards twisting around causing pain around the lateral aspect of her ankle.  She has been able to walk on it but it has been swollen and painful.  She has a history of spraining this ankle in the past.  She has put ice on it.  She denies any other injury in the fall.     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      Home Medications    Prior to Admission medications   Medication Sig Start Date End Date Taking? Authorizing Provider  fluticasone  (FLONASE ) 50 MCG/ACT nasal spray Place 2 sprays into both nostrils daily. 06/13/22  Yes [provider]  lidocaine  (LIDODERM ) 5 % Place 1 patch onto the skin daily. Remove & Discard patch within 12 hours or as directed by MD 02/14/24  Yes Raspet, Erin K, [name]  Oxcarbazepine  (TRILEPTAL ) 300 MG tablet TAKE 1 TABLET BY MOUTH 2 TIMES DAILY. 06/04/23  Yes Cassandra Cleveland, MD  dextromethorphan-guaiFENesin  (MUCINEX  DM) 30-600 MG 12hr tablet Take 1 tablet by  mouth 2 (two) times daily as needed. Patient not taking: Reported on 02/14/2024 06/13/22   [provider]  naproxen  (NAPROSYN ) 375 MG tablet Take 1 tablet (375 mg total) by mouth 2 (two) times daily. 02/15/24   Adison Jerger A, [name]  ondansetron  (ZOFRAN ) 4 MG tablet Take 1 tablet (4 mg total) by mouth every 8 (eight) hours as needed for nausea or vomiting. Patient not taking: Reported on 02/14/2024 12/24/23   Buena Carmine, NP  loratadine  (CLARITIN ) 10 MG tablet Take 1 tablet (10 mg total) by mouth daily. 10/15/18 01/28/21  Charise Companion, MD    Family History Family History  Problem Relation Age of Onset   Healthy Mother    Seizures Father    Seizures Sister     Social History Social History   Tobacco Use   Smoking status: Never   Smokeless tobacco: Never  Vaping Use   Vaping status: Never Used  Substance Use Topics   Alcohol use: No   Drug use: No     Allergies   Lamotrigine , Codeine , Latex, Levetiracetam , and Tdap [tetanus-diphth-acell pertussis]   Review of Systems Review of Systems  Constitutional:  Negative for chills and fever.  HENT:  Negative for ear pain and sore throat.   Eyes:  Negative for pain and visual  disturbance.  Respiratory:  Negative for cough and shortness of breath.   Cardiovascular:  Negative for chest pain and palpitations.  Gastrointestinal:  Negative for abdominal pain and vomiting.  Genitourinary:  Negative for dysuria and hematuria.  Musculoskeletal:  Negative for arthralgias and back pain.       Right ankle pain  Skin:  Negative for color change and rash.  Neurological:  Negative for seizures and syncope.  All other systems reviewed and are negative.    Physical Exam Triage Vital Signs ED Triage Vitals  Encounter Vitals Group     BP 02/15/24 1801 123/81     Girls Systolic BP Percentile --      Girls Diastolic BP Percentile --      Boys Systolic BP Percentile --      Boys Diastolic BP Percentile --      Pulse Rate  02/15/24 1801 88     Resp 02/15/24 1801 18     Temp 02/15/24 1801 98.4 F (36.9 C)     Temp Source 02/15/24 1801 Oral     SpO2 02/15/24 1801 99 %     Weight --      Height --      Head Circumference --      Peak Flow --      Pain Score 02/15/24 1803 0     Pain Loc --      Pain Education --      Exclude from Growth Chart --    No data found.  Updated Vital Signs BP 123/81 (BP Location: Left Arm)   Pulse 88   Temp 98.4 F (36.9 C) (Oral)   Resp 18   LMP 02/02/2024 (Approximate)   SpO2 99%   Visual Acuity Right Eye Distance:   Left Eye Distance:   Bilateral Distance:    Right Eye Near:   Left Eye Near:    Bilateral Near:     Physical Exam Vitals and nursing note reviewed.  Constitutional:      General: She is not in acute distress.    Appearance: She is well-developed.  HENT:     Head: Normocephalic and atraumatic.   Eyes:     Conjunctiva/sclera: Conjunctivae normal.    Cardiovascular:     Rate and Rhythm: Normal rate and regular rhythm.  Pulmonary:     Effort: Pulmonary effort is normal. No respiratory distress.  Abdominal:     Palpations: Abdomen is soft.   Musculoskeletal:        General: No swelling.     Cervical back: Neck supple.     Right ankle: Swelling present. No deformity. Tenderness present over the lateral malleolus. Normal range of motion. Normal pulse.     Right Achilles Tendon: Normal.   Skin:    General: Skin is warm and dry.     Capillary Refill: Capillary refill takes less than 2 seconds.   Neurological:     Mental Status: She is alert.   Psychiatric:        Mood and Affect: Mood normal.      UC Treatments / Results  Labs (all labs ordered are listed, but only abnormal results are displayed) Labs Reviewed - No data to display  EKG   Radiology No results found.  Procedures Procedures (including critical care time)  Medications Ordered in UC Medications - No data to display  Initial Impression / Assessment and  Plan / UC Course  I have reviewed the triage vital signs and the nursing  notes.  Pertinent labs & imaging results that were available during my care of the patient were reviewed by me and considered in my medical decision making (see chart for details).     Acute right ankle pain - Plan: DG Ankle Complete Right, DG Ankle Complete Right   X-ray done today of the right ankle.  Final evaluation by the radiologist is still pending but on brief evaluation there does not appear to be any acute fracture.  Once the radiologist is read the x-ray if there is any changes we will contact you.  For now we will use a lace up ankle brace and have you wear this during the day and with any activity for the next 7 to 10 days.  After this you may remove the brace and increase activity as tolerated.  If the pain returns then recommend following up with an orthopedist.  May use naproxen  375 mg twice daily as needed for pain. Ice the area 2-3 times daily for 10-15 minutes to help with pain and swelling. Do not apply ice directly to the skin.    Final Clinical Impressions(s) / UC Diagnoses   Final diagnoses:  Acute right ankle pain     Discharge Instructions      X-ray done today of the right ankle.  Final evaluation by the radiologist is still pending but on brief evaluation there does not appear to be any acute fracture.  Once the radiologist is read the x-ray if there is any changes we will contact you.  For now we will use a lace up ankle brace and have you wear this during the day and with any activity for the next 7 to 10 days.  After this you may remove the brace and increase activity as tolerated.  If the pain returns then recommend following up with an orthopedist.  May use naproxen  375 mg twice daily as needed for pain. Ice the area 2-3 times daily for 10-15 minutes to help with pain and swelling. Do not apply ice directly to the skin.       ED Prescriptions     Medication Sig Dispense Auth. Provider    naproxen  (NAPROSYN ) 375 MG tablet Take 1 tablet (375 mg total) by mouth 2 (two) times daily. 15 tablet Kreg Pesa, [name]      PDMP not reviewed this encounter.   Kreg Pesa, [place] 02/15/24 1839

## 2024-02-17 ENCOUNTER — Ambulatory Visit (HOSPITAL_COMMUNITY): Payer: Self-pay

## 2024-02-17 LAB — CERVICOVAGINAL ANCILLARY ONLY
Bacterial Vaginitis (gardnerella): NEGATIVE
Candida Glabrata: NEGATIVE
Candida Vaginitis: POSITIVE — AB
Chlamydia: NEGATIVE
Comment: NEGATIVE
Comment: NEGATIVE
Comment: NEGATIVE
Comment: NEGATIVE
Comment: NEGATIVE
Comment: NORMAL
Neisseria Gonorrhea: NEGATIVE
Trichomonas: NEGATIVE

## 2024-02-23 ENCOUNTER — Ambulatory Visit: Payer: Self-pay | Admitting: Internal Medicine

## 2024-04-07 ENCOUNTER — Other Ambulatory Visit: Payer: Self-pay | Admitting: Neurology

## 2024-04-10 ENCOUNTER — Ambulatory Visit (INDEPENDENT_AMBULATORY_CARE_PROVIDER_SITE_OTHER)

## 2024-04-10 ENCOUNTER — Ambulatory Visit
Admission: EM | Admit: 2024-04-10 | Discharge: 2024-04-10 | Disposition: A | Discharge status: Home / Self Care | Attending: Family Medicine | Admitting: Family Medicine

## 2024-04-10 ENCOUNTER — Ambulatory Visit: Payer: Self-pay | Admitting: Urgent Care

## 2024-04-10 DIAGNOSIS — R042 Hemoptysis: Secondary | ICD-10-CM | POA: Diagnosis not present

## 2024-04-10 DIAGNOSIS — J209 Acute bronchitis, unspecified: Secondary | ICD-10-CM

## 2024-04-10 MED ORDER — PROMETHAZINE-DM 6.25-15 MG/5ML PO SYRP: 5.0000 mL | ORAL_SOLUTION | Freq: Every evening | ORAL | 0 refills | Status: DC | PRN

## 2024-04-10 MED ORDER — ALBUTEROL SULFATE HFA 108 (90 BASE) MCG/ACT IN AERS: 1.0000 | INHALATION_SPRAY | Freq: Four times a day (QID) | RESPIRATORY_TRACT | 0 refills | Status: DC | PRN

## 2024-04-10 MED ORDER — PREDNISONE 10 MG PO TABS: 30.0000 mg | ORAL_TABLET | Freq: Every day | ORAL | 0 refills | Status: DC

## 2024-04-10 NOTE — Discharge Instructions (Signed)
 Will let you know about your chest x-ray results later today.  For now, would like to manage you for acute bronchitis.  Start prednisone  for this primary problem, use albuterol  inhaler for shortness of breath, chest tightness, wheezing.  Use cough syrup as needed.

## 2024-04-10 NOTE — ED Triage Notes (Signed)
 Pt reports breathing concern that started last night - reports every time she takes a breath she just has an achy feeling like her lungs hurt. Pt reports an emesis episode earlier today, then stated it was just her spitting up a lot of mucus. Pt reports no recent colds. Pt lastly reports an outbreak in her mouth - I feel like I have bumps all over my mouth. Pt denies pain in mouth and states the bumps have been there a few days. Unknown cause per pt.

## 2024-04-10 NOTE — ED Provider Notes (Signed)
 [facility]  Note:  This document was prepared using Conservation officer, historic buildings and may include unintentional dictation errors.  MRN: [id number] DOB: [birth year]  Subjective:   Val Farnam is a 23 y.o. female presenting for 1 day history of shortness of breath, productive coughing, started having a stuffy nose and hemoptysis today. Had an episode of vomiting while brushing her teeth as well this morning and started to feel wheezing, chest tightness as well. No asthma. No smoking of any kind including cigarettes, cigars, vaping, marijuana use.  No history of PE, dvt, clotting disorder.   No current facility-administered medications for this encounter.  Current Outpatient Medications:    Oxcarbazepine  (TRILEPTAL ) 300 MG tablet, TAKE 1 TABLET BY MOUTH 2 TIMES DAILY., Disp: 180 tablet, Rfl: 3   dextromethorphan-guaiFENesin  (MUCINEX  DM) 30-600 MG 12hr tablet, Take 1 tablet by mouth 2 (two) times daily as needed. (Patient not taking: Reported on 02/14/2024), Disp: , Rfl:    fluconazole  (DIFLUCAN ) 150 MG tablet, Take 150 mg by mouth once. (Patient not taking: Reported on 04/10/2024), Disp: , Rfl:    fluticasone  (FLONASE ) 50 MCG/ACT nasal spray, Place 2 sprays into both nostrils daily. (Patient not taking: Reported on 04/10/2024), Disp: , Rfl:    lidocaine  (LIDODERM ) 5 %, Place 1 patch onto the skin daily. Remove & Discard patch within 12 hours or as directed by MD (Patient not taking: Reported on 04/10/2024), Disp: 7 patch, Rfl: 0   naproxen  (NAPROSYN ) 375 MG tablet, Take 1 tablet (375 mg total) by mouth 2 (two) times daily. (Patient not taking: Reported on 04/10/2024), Disp: 15 tablet, Rfl: 0   ondansetron  (ZOFRAN ) 4 MG tablet, Take 1 tablet (4 mg total) by mouth every 8 (eight) hours as needed for nausea or vomiting. (Patient not taking: Reported on 02/14/2024), Disp: 20 tablet, Rfl: 0   Allergies  Allergen Reactions   Lamotrigine      Dizziness, Hallucinations  Other  Reaction(s): Dizziness (intolerance)  Other Reaction(s): Dizziness   Codeine     Latex    Levetiracetam  Other (See Comments)    psychosis  Other Reaction(s): Other (See Comments)   Tdap [Tetanus-Diphth-Acell Pertussis]     Past Medical History:  Diagnosis Date   Seizure (HCC)    Seizures (HCC)    Well child check 04/26/2011     Past Surgical History:  Procedure Laterality Date   LEG SURGERY      Family History  Problem Relation Age of Onset   Healthy Mother    Seizures Father    Seizures Sister     Social History   Tobacco Use   Smoking status: Never   Smokeless tobacco: Never  Vaping Use   Vaping status: Never Used  Substance Use Topics   Alcohol use: No   Drug use: No    ROS   Objective:   Vitals: BP 111/80 (BP Location: Left Arm)   Pulse 95   Temp 98.8 F (37.1 C) (Oral)   Resp 14   SpO2 98%   Physical Exam Constitutional:      General: She is not in acute distress.    Appearance: Normal appearance. She is well-developed. She is not ill-appearing, toxic-appearing or diaphoretic.  HENT:     Head: Normocephalic and atraumatic.     Right Ear: External ear normal.     Left Ear: External ear normal.     Nose: Nose normal.     Mouth/Throat:     Mouth: Mucous membranes are moist.  Pharynx: No pharyngeal swelling, oropharyngeal exudate, posterior oropharyngeal erythema or uvula swelling.     Tonsils: No tonsillar exudate or tonsillar abscesses. 0 on the right. 0 on the left.  Eyes:     General: No scleral icterus.       Right eye: No discharge.        Left eye: No discharge.     Extraocular Movements: Extraocular movements intact.     Conjunctiva/sclera: Conjunctivae normal.  Cardiovascular:     Rate and Rhythm: Normal rate and regular rhythm.     Heart sounds: Normal heart sounds. No murmur heard.    No friction rub. No gallop.  Pulmonary:     Effort: Pulmonary effort is normal. No respiratory distress.     Breath sounds: No stridor. No  wheezing, rhonchi or rales.  Chest:     Chest wall: No tenderness.  Abdominal:     General: Bowel sounds are normal. There is no distension.     Palpations: Abdomen is soft. There is no mass.     Tenderness: There is no abdominal tenderness. There is no right CVA tenderness, left CVA tenderness, guarding or rebound.  Skin:    General: Skin is warm and dry.  Neurological:     General: No focal deficit present.     Mental Status: She is alert and oriented to person, place, and time.  Psychiatric:        Mood and Affect: Mood normal.        Behavior: Behavior normal.        Thought Content: Thought content normal.        Judgment: Judgment normal.     Assessment and Plan :   PDMP not reviewed this encounter.  1. Acute bronchitis, unspecified organism   2. Hemoptysis    Patient is hemodynamically stable vital signs. X-ray over-read was pending at time of discharge, recommended follow up with only abnormal results. Otherwise will not call for negative over-read. Patient was in agreement. Will manage for acute bronchitis with prednisone  and albuterol , supportive care. Counseled patient on potential for adverse effects with medications prescribed/recommended today, ER and return-to-clinic precautions discussed, patient verbalized understanding.    Christopher Savannah, [name] 04/10/24 1520

## 2024-04-26 ENCOUNTER — Encounter (HOSPITAL_COMMUNITY): Payer: Self-pay | Admitting: Emergency Medicine

## 2024-04-26 ENCOUNTER — Other Ambulatory Visit: Payer: Self-pay

## 2024-04-26 ENCOUNTER — Ambulatory Visit (HOSPITAL_COMMUNITY)
Admission: EM | Admit: 2024-04-26 | Discharge: 2024-04-26 | Disposition: A | Discharge status: Home / Self Care | Attending: Emergency Medicine | Admitting: Emergency Medicine

## 2024-04-26 DIAGNOSIS — J302 Other seasonal allergic rhinitis: Secondary | ICD-10-CM | POA: Diagnosis not present

## 2024-04-26 DIAGNOSIS — H6001 Abscess of right external ear: Secondary | ICD-10-CM

## 2024-04-26 MED ORDER — CETIRIZINE HCL 10 MG PO TABS: 10.0000 mg | ORAL_TABLET | Freq: Every day | ORAL | 0 refills | Status: AC

## 2024-04-26 MED ORDER — DOXYCYCLINE HYCLATE 100 MG PO TABS: 100.0000 mg | ORAL_TABLET | Freq: Two times a day (BID) | ORAL | 0 refills | Status: AC

## 2024-04-26 NOTE — ED Provider Notes (Signed)
 [facility]    CSN: [id number] Arrival date & time: 04/26/24  1749    HISTORY   Chief Complaint  Patient presents with   Otalgia   HPI [name] is a pleasant, 23 y.o. female who presents to urgent care today. Patient complains of a pimple inside her right ear 2 days ago.  States her grandmother tried to pop a couple without success.  Patient states that it is now more swollen and painful than it was before her grandmother messed with it.  Patient also states she was recently diagnosed with bronchitis for which she is still taking prednisone  (not taking it 3 times daily as recommended but instead only once daily and when she remembers it), albuterol  and Promethazine  DM which she states she is almost out of.  Patient also endorses a history of seasonal allergies for which she does not take any medication, states her allergy symptoms are usually worse in the latter time.  Patient denies fever, body aches, chills, nausea, vomiting, diarrhea, known sick contacts.  The history is provided by the patient.   Past Medical History:  Diagnosis Date   Seizure (HCC)    Seizures (HCC)    Well child check 04/26/2011   Patient Active Problem List   Diagnosis Date Noted   Birth control counseling 12/03/2021   LGSIL on Pap smear of cervix 12/03/2021   Routine screening for STI (sexually transmitted infection) 07/09/2021   Brief psychotic disorder (HCC)    Hypokalemia    Seizure-like activity (HCC) 01/28/2021   Anemia    Strep throat    Encounter for Nexplanon  removal 11/04/2020   Family history of brain cancer 05/20/2015   Seizures (HCC) 10/31/2006   Past Surgical History:  Procedure Laterality Date   LEG SURGERY     OB History   No obstetric history on file.    Home Medications    Prior to Admission medications   Medication Sig Start Date End Date Taking? Authorizing Provider  albuterol  (VENTOLIN  HFA) 108 (90 Base) MCG/ACT inhaler Inhale 1-2 puffs into the lungs  every 6 (six) hours as needed for wheezing or shortness of breath. 04/10/24   Christopher Savannah, [name]  dextromethorphan-guaiFENesin  (MUCINEX  DM) 30-600 MG 12hr tablet Take 1 tablet by mouth 2 (two) times daily as needed. Patient not taking: Reported on 02/14/2024 06/13/22   [provider]  fluconazole  (DIFLUCAN ) 150 MG tablet Take 150 mg by mouth once. Patient not taking: Reported on 04/10/2024 02/14/24   [provider]  fluticasone  (FLONASE ) 50 MCG/ACT nasal spray Place 2 sprays into both nostrils daily. Patient not taking: Reported on 04/10/2024 06/13/22   [provider]  lidocaine  (LIDODERM ) 5 % Place 1 patch onto the skin daily. Remove & Discard patch within 12 hours or as directed by MD Patient not taking: Reported on 04/10/2024 02/14/24   Raspet, Erin K, [name]  naproxen  (NAPROSYN ) 375 MG tablet Take 1 tablet (375 mg total) by mouth 2 (two) times daily. Patient not taking: Reported on 04/10/2024 02/15/24   Teresa Almarie LABOR, [name]  ondansetron  (ZOFRAN ) 4 MG tablet Take 1 tablet (4 mg total) by mouth every 8 (eight) hours as needed for nausea or vomiting. Patient not taking: Reported on 02/14/2024 12/24/23   Arloa Suzen RAMAN, NP  Oxcarbazepine  (TRILEPTAL ) 300 MG tablet TAKE 1 TABLET BY MOUTH 2 TIMES DAILY. 04/08/24   Camara, Amadou, MD  predniSONE  (DELTASONE ) 10 MG tablet Take 3 tablets (30 mg total) by mouth daily with breakfast. 04/10/24  Christopher Savannah, [name]  promethazine -dextromethorphan (PROMETHAZINE -DM) 6.25-15 MG/5ML syrup Take 5 mLs by mouth at bedtime as needed for cough. 04/10/24   Christopher Savannah, [name]  loratadine  (CLARITIN ) 10 MG tablet Take 1 tablet (10 mg total) by mouth daily. 10/15/18 01/28/21  Rosalynn Camie CROME, MD    Family History Family History  Problem Relation Age of Onset   Healthy Mother    Seizures Father    Seizures Sister    Social History Social History   Tobacco Use   Smoking status: Never   Smokeless tobacco: Never  Vaping Use   Vaping status: Never Used   Substance Use Topics   Alcohol use: No   Drug use: No   Allergies   Lamotrigine , Codeine , Latex, Levetiracetam , and Tdap [tetanus-diphth-acell pertussis]  Review of Systems Review of Systems Pertinent findings revealed after performing a 14 point review of systems has been noted in the history of present illness.  Physical Exam Vital Signs BP 111/68 (BP Location: Left Arm)   Pulse 99   Temp 98.2 F (36.8 C) (Oral)   Resp 20   LMP 03/18/2024   SpO2 98%   No data found.  Physical Exam Vitals and nursing note reviewed.  Constitutional:      General: She is awake. She is not in acute distress.    Appearance: Normal appearance. She is well-developed and well-groomed. She is not ill-appearing.  HENT:     Head: Normocephalic and atraumatic.     Salivary Glands: Right salivary gland is not diffusely enlarged or tender. Left salivary gland is not diffusely enlarged or tender.     Right Ear: Hearing, ear canal and external ear normal. Tenderness present. No drainage or swelling. A middle ear effusion is present. There is no impacted cerumen. Tympanic membrane is bulging. Tympanic membrane is not injected or erythematous.     Left Ear: Hearing, ear canal and external ear normal. No drainage, swelling or tenderness. A middle ear effusion is present. There is no impacted cerumen. Tympanic membrane is bulging. Tympanic membrane is not injected or erythematous.     Ears:     Comments: Bilateral EACs normal, both TMs bulging with clear fluid.  There is a 4 mm, vesicular lesion filled with purulent fluid present at the antrum of the right EAC with surrounding erythema and tenderness to palpation.    Nose: Rhinorrhea present. No nasal deformity, septal deviation, signs of injury, nasal tenderness, mucosal edema or congestion. Rhinorrhea is clear.     Right Nostril: Occlusion present. No foreign body, epistaxis or septal hematoma.     Left Nostril: Occlusion present. No foreign body, epistaxis or  septal hematoma.     Right Turbinates: Enlarged, swollen and pale.     Left Turbinates: Enlarged, swollen and pale.     Right Sinus: No maxillary sinus tenderness or frontal sinus tenderness.     Left Sinus: No maxillary sinus tenderness or frontal sinus tenderness.     Mouth/Throat:     Lips: Pink. No lesions.     Mouth: Mucous membranes are moist. No oral lesions.     Pharynx: Oropharynx is clear. Uvula midline. Postnasal drip present. No pharyngeal swelling, oropharyngeal exudate, posterior oropharyngeal erythema or uvula swelling.     Tonsils: No tonsillar exudate. 0 on the right. 0 on the left.     Comments: Postnasal drip Eyes:     General: Lids are normal.        Right eye: No discharge.  Left eye: No discharge.     Extraocular Movements: Extraocular movements intact.     Conjunctiva/sclera: Conjunctivae normal.     Right eye: Right conjunctiva is not injected.     Left eye: Left conjunctiva is not injected.  Neck:     Trachea: Trachea and phonation normal.  Cardiovascular:     Rate and Rhythm: Normal rate and regular rhythm.     Pulses: Normal pulses.     Heart sounds: Normal heart sounds. No murmur heard.    No friction rub. No gallop.  Pulmonary:     Effort: Pulmonary effort is normal. No accessory muscle usage, prolonged expiration or respiratory distress.     Breath sounds: Normal breath sounds. No stridor, decreased air movement or transmitted upper airway sounds. No decreased breath sounds, wheezing, rhonchi or rales.  Chest:     Chest wall: No tenderness.  Musculoskeletal:        General: Normal range of motion.     Cervical back: Normal range of motion and neck supple. Normal range of motion.  Lymphadenopathy:     Cervical: No cervical adenopathy.  Skin:    General: Skin is warm and dry.     Findings: No erythema or rash.  Neurological:     General: No focal deficit present.     Mental Status: She is alert and oriented to person, place, and time.   Psychiatric:        Mood and Affect: Mood normal.        Behavior: Behavior normal. Behavior is cooperative.     Visual Acuity Right Eye Distance:   Left Eye Distance:   Bilateral Distance:    Right Eye Near:   Left Eye Near:    Bilateral Near:     UC Couse / Diagnostics / Procedures:     Radiology No results found.  Procedures Procedures (including critical care time) EKG  Pending results:  Labs Reviewed - No data to display  Medications Ordered in UC: Medications - No data to display  UC Diagnoses / Final Clinical Impressions(s)   I have reviewed the triage vital signs and the nursing notes.  Pertinent labs & imaging results that were available during my care of the patient were reviewed by me and considered in my medical decision making (see chart for details).    Final diagnoses:  Seasonal allergies  Abscess, ear canal, right   Patient provided with doxycycline  for 7 days for treatment of infected lesion on her right ear canal and Zyrtec  for relief of seasonal allergies.  Conservative care recommended.  Return precautions advised.  Please see discharge instructions below for details of plan of care as provided to patient. ED Prescriptions     Medication Sig Dispense Auth. Provider   doxycycline  (VIBRA -TABS) 100 MG tablet Take 1 tablet (100 mg total) by mouth 2 (two) times daily for 7 days. 14 tablet Joesph Shaver Scales, [name]   cetirizine  (ZYRTEC  ALLERGY) 10 MG tablet Take 1 tablet (10 mg total) by mouth at bedtime. 90 tablet Joesph Shaver Scales, [name]      PDMP not reviewed this encounter.  Pending results:  Labs Reviewed - No data to display    Discharge Instructions      Please read below to learn more about the medications, dosages and frequencies that I recommend to help alleviate your symptoms and to get you feeling better soon:   Adoxa, Vibramycin  (doxycycline ): Please take one (1) dose twice daily for 7 days.  This  antibiotic can make  you more sensitive to sunlight and may cause you to burn more easily.  Please avoid direct exposure while taking.  Please also avoid taking this medication with foods that contain calcium such as dairy products (milk, yogurt, cheese, ice cream).  Calcium binds with doxycycline  and prevents your body from absorbing it.  Please separate dairy products contain calcium and taking doxycycline  by 2 hours.   Zyrtec  (cetirizine ): This allergy medication is an excellent second-generation antihistamine that helps to reduce respiratory inflammatory response to environmental allergens.  In some patients, this medication can cause daytime sleepiness so I recommend that you take 1 tablet daily at bedtime.     If symptoms have not meaningfully improved in the next 5 to 7 days, please return for repeat evaluation or follow-up with your regular provider.  If symptoms have worsened in the next 3 to 5 days, please go to the emergency room for further evaluation.    Thank you for visiting urgent care today.  We appreciate the opportunity to participate in your care.       Disposition Upon Discharge:  Condition: stable for discharge home  Patient presented with an acute illness with associated systemic symptoms and significant discomfort requiring urgent management. In my opinion, this is a condition that a prudent lay person (someone who possesses an average knowledge of health and medicine) may potentially expect to result in complications if not addressed urgently such as respiratory distress, impairment of bodily function or dysfunction of bodily organs.   Routine symptom specific, illness specific and/or disease specific instructions were discussed with the patient and/or caregiver at length.   As such, the patient has been evaluated and assessed, work-up was performed and treatment was provided in alignment with urgent care protocols and evidence based medicine.  Patient/parent/caregiver has been advised that  the patient may require follow up for further testing and treatment if the symptoms continue in spite of treatment, as clinically indicated and appropriate.  Patient/parent/caregiver has been advised to return to the [facility] or PCP if no better; to PCP or the Emergency Department if new signs and symptoms develop, or if the current signs or symptoms continue to change or worsen for further workup, evaluation and treatment as clinically indicated and appropriate  The patient will follow up with their current PCP if and as advised. If the patient does not currently have a PCP we will assist them in obtaining one.   The patient may need specialty follow up if the symptoms continue, in spite of conservative treatment and management, for further workup, evaluation, consultation and treatment as clinically indicated and appropriate.  Patient/parent/caregiver verbalized understanding and agreement of plan as discussed.  All questions were addressed during visit.  Please see discharge instructions below for further details of plan.  This office note has been dictated using Teaching laboratory technician.  Unfortunately, this method of dictation can sometimes lead to typographical or grammatical errors.  I apologize for your inconvenience in advance if this occurs.  Please do not hesitate to reach out to me if clarification is needed.      Joesph Shaver Scales, [place] 04/26/24 [phone]

## 2024-04-26 NOTE — Discharge Instructions (Signed)
 Please read below to learn more about the medications, dosages and frequencies that I recommend to help alleviate your symptoms and to get you feeling better soon:   Adoxa, Vibramycin  (doxycycline ): Please take one (1) dose twice daily for 7 days.  This antibiotic can make you more sensitive to sunlight and may cause you to burn more easily.  Please avoid direct exposure while taking.  Please also avoid taking this medication with foods that contain calcium such as dairy products (milk, yogurt, cheese, ice cream).  Calcium binds with doxycycline  and prevents your body from absorbing it.  Please separate dairy products contain calcium and taking doxycycline  by 2 hours.   Zyrtec  (cetirizine ): This allergy medication is an excellent second-generation antihistamine that helps to reduce respiratory inflammatory response to environmental allergens.  In some patients, this medication can cause daytime sleepiness so I recommend that you take 1 tablet daily at bedtime.     If symptoms have not meaningfully improved in the next 5 to 7 days, please return for repeat evaluation or follow-up with your regular provider.  If symptoms have worsened in the next 3 to 5 days, please go to the emergency room for further evaluation.    Thank you for visiting urgent care today.  We appreciate the opportunity to participate in your care.

## 2024-04-26 NOTE — ED Triage Notes (Signed)
 Patient reports right ear started hurting while out of town .  Pain started 2 days ago.  Patient has a cough.  Patient has a runny nose.  Patient has not had any medications for symptoms

## 2024-07-12 ENCOUNTER — Ambulatory Visit: Admission: EM | Admit: 2024-07-12 | Discharge: 2024-07-12

## 2024-07-12 ENCOUNTER — Emergency Department (HOSPITAL_COMMUNITY)

## 2024-07-12 ENCOUNTER — Emergency Department (HOSPITAL_COMMUNITY)
Admission: EM | Admit: 2024-07-12 | Discharge: 2024-07-12 | Disposition: A | Discharge status: Home / Self Care | Attending: Emergency Medicine | Admitting: Emergency Medicine

## 2024-07-12 ENCOUNTER — Encounter (HOSPITAL_COMMUNITY): Payer: Self-pay

## 2024-07-12 ENCOUNTER — Other Ambulatory Visit: Payer: Self-pay

## 2024-07-12 ENCOUNTER — Encounter: Payer: Self-pay | Admitting: *Deleted

## 2024-07-12 DIAGNOSIS — T7411XA Adult physical abuse, confirmed, initial encounter: Secondary | ICD-10-CM | POA: Insufficient documentation

## 2024-07-12 DIAGNOSIS — R0789 Other chest pain: Secondary | ICD-10-CM | POA: Diagnosis not present

## 2024-07-12 DIAGNOSIS — S199XXA Unspecified injury of neck, initial encounter: Secondary | ICD-10-CM | POA: Diagnosis not present

## 2024-07-12 DIAGNOSIS — M542 Cervicalgia: Secondary | ICD-10-CM

## 2024-07-12 DIAGNOSIS — S0990XA Unspecified injury of head, initial encounter: Secondary | ICD-10-CM | POA: Diagnosis not present

## 2024-07-12 LAB — BASIC METABOLIC PANEL WITH GFR
Anion gap: 12 (ref 5–15)
BUN: 14 mg/dL (ref 6–20)
CO2: 20 mmol/L — ABNORMAL LOW (ref 22–32)
Calcium: 9.1 mg/dL (ref 8.9–10.3)
Chloride: 106 mmol/L (ref 98–111)
Creatinine, Ser: 0.7 mg/dL (ref 0.44–1.00)
GFR, Estimated: >60 mL/min (ref >60)
Glucose, Bld: 95 mg/dL (ref 70–99)
Potassium: 3.5 mmol/L (ref 3.5–5.1)
Sodium: 138 mmol/L (ref 135–145)

## 2024-07-12 LAB — CBC
HCT: 37.7 % (ref 36.0–46.0)
Hemoglobin: 12 g/dL (ref 12.0–15.0)
MCH: 29.1 pg (ref 26.0–34.0)
MCHC: 31.8 g/dL (ref 30.0–36.0)
MCV: 91.5 fL (ref 80.0–100.0)
Platelets: 322 K/uL (ref 150–400)
RBC: 4.12 MIL/uL (ref 3.87–5.11)
RDW: 13 % (ref 11.5–15.5)
WBC: 5.2 K/uL (ref 4.0–10.5)
nRBC: 0 % (ref 0.0–0.2)

## 2024-07-12 LAB — HCG, SERUM, QUALITATIVE: Preg, Serum: NEGATIVE

## 2024-07-12 MED ORDER — METHOCARBAMOL 500 MG PO TABS: 500.0000 mg | ORAL_TABLET | Freq: Two times a day (BID) | ORAL | 0 refills | Status: AC

## 2024-07-12 MED ORDER — IOHEXOL 350 MG/ML SOLN
75.0000 mL | Freq: Once | INTRAVENOUS | Status: AC | PRN
  Administered 2024-07-12: 75 mL via INTRAVENOUS

## 2024-07-12 MED ORDER — ACETAMINOPHEN 325 MG PO TABS
650.0000 mg | ORAL_TABLET | Freq: Once | ORAL | Status: AC
  Administered 2024-07-12: 650 mg via ORAL
  Filled 2024-07-12: qty 2
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
## 2024-07-12 NOTE — ED Notes (Signed)
Unsuccessful IV attempt in LAC.
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
## 2024-07-12 NOTE — Discharge Instructions (Addendum)
-  Please head to the ER for further management. You need imaging of your jaw and ribs that I am unable to do in UC.
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
## 2024-07-12 NOTE — Discharge Instructions (Addendum)
 As discussed, please follow-up with primary care provider.  Seek emergency care if experiencing any new or worsening symptoms.   Alternating between 650 mg Tylenol  and 400 mg Advil : The best way to alternate taking Acetaminophen  (example Tylenol ) and Ibuprofen  (example Advil /Motrin ) is to take them 3 hours apart. For example, if you take ibuprofen  at 6 am you can then take Tylenol  at 9 am. You can continue this regimen throughout the day, making sure you do not exceed the recommended maximum dose for each drug.
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
## 2024-07-31 ENCOUNTER — Ambulatory Visit: Admission: EM | Admit: 2024-07-31 | Discharge: 2024-07-31 | Disposition: A | Discharge status: Home / Self Care

## 2024-07-31 DIAGNOSIS — N3000 Acute cystitis without hematuria: Secondary | ICD-10-CM | POA: Insufficient documentation

## 2024-07-31 DIAGNOSIS — R112 Nausea with vomiting, unspecified: Secondary | ICD-10-CM | POA: Diagnosis not present

## 2024-07-31 LAB — POCT URINE DIPSTICK
Bilirubin, UA: NEGATIVE
Blood, UA: NEGATIVE
Glucose, UA: NEGATIVE mg/dL
Nitrite, UA: NEGATIVE
POC PROTEIN,UA: 100 — AB
Spec Grav, UA: 1.025 (ref 1.010–1.025)
Urobilinogen, UA: 1 U/dL
pH, UA: 7 (ref 5.0–8.0)

## 2024-07-31 LAB — POCT URINE PREGNANCY: Preg Test, Ur: NEGATIVE

## 2024-07-31 MED ORDER — NITROFURANTOIN MONOHYD MACRO 100 MG PO CAPS: 100.0000 mg | ORAL_CAPSULE | Freq: Two times a day (BID) | ORAL | 0 refills | Status: AC

## 2024-07-31 NOTE — ED Provider Notes (Signed)
 [name]    CSN: [id number] Arrival date & time: 07/31/24  1802      History   Chief Complaint No chief complaint on file.   HPI [name] is a 23 y.o. female.   Patient presents today due to concern for alcohol poisoning.  Patient states that she was out drinking last Saturday and states that she has been experiencing nausea, body aches, and fatigue the entire week.  Patient denies dysuria or urinary frequency.  Patient does admit that she recently had intercourse as well.  The history is provided by the patient.    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      Home Medications    Prior to Admission medications   Medication Sig Start Date End Date Taking? Authorizing Provider  methocarbamol  (ROBAXIN ) 500 MG tablet Take 1 tablet (500 mg total) by mouth 2 (two) times daily. 07/12/24  Yes Meredith, Nidia F, [name]  nitrofurantoin, macrocrystal-monohydrate, (MACROBID) 100 MG capsule Take 1 capsule (100 mg total) by mouth 2 (two) times daily. 07/31/24  Yes Andra Krabbe C, [name]  Oxcarbazepine  (TRILEPTAL ) 300 MG tablet TAKE 1 TABLET BY MOUTH 2 TIMES DAILY. 04/08/24  Yes Camara, Amadou, MD  cetirizine  (ZYRTEC  ALLERGY) 10 MG tablet Take 1 tablet (10 mg total) by mouth at bedtime. 04/26/24 07/25/24  Joesph Shaver Scales, [name]   loratadine  (CLARITIN ) 10 MG tablet Take 1 tablet (10 mg total) by mouth daily. 10/15/18 01/28/21  Rosalynn Camie CROME, MD    Family History Family History  Problem Relation Age of Onset   Healthy Mother    Seizures Father    Seizures Sister     Social History Social History   Tobacco Use   Smoking status: Never   Smokeless tobacco: Never  Vaping Use   Vaping status: Never Used  Substance Use Topics   Alcohol use: No   Drug use: No     Allergies   Lamotrigine , Codeine , Latex, Levetiracetam , and Tdap [tetanus-diphth-acell pertussis]   Review of Systems Review of Systems   Physical Exam Triage Vital Signs ED Triage Vitals [07/31/24 1826]  Encounter Vitals Group     BP 127/77     Girls Systolic BP Percentile      Girls Diastolic BP Percentile      Boys Systolic BP Percentile      Boys Diastolic BP Percentile      Pulse Rate (!) 104     Resp 20     Temp 98.1 F (36.7 C)     Temp Source Oral     SpO2 98 %     Weight      Height      Head Circumference      Peak Flow  Pain Score      Pain Loc      Pain Education      Exclude from Growth Chart    No data found.  Updated Vital Signs BP 127/77 (BP Location: Left Arm)   Pulse (!) 104   Temp 98.1 F (36.7 C) (Oral)   Resp 20   LMP 06/24/2024 (Approximate)   SpO2 98%   Visual Acuity Right Eye Distance:   Left Eye Distance:   Bilateral Distance:    Right Eye Near:   Left Eye Near:    Bilateral Near:     Physical Exam Vitals and nursing note reviewed.  Constitutional:      General: She is not in acute distress.    Appearance: Normal appearance. She is not ill-appearing, toxic-appearing or diaphoretic.  Eyes:     General: No scleral icterus. Cardiovascular:     Rate and Rhythm: Normal rate and regular rhythm.     Heart sounds: Normal heart sounds.  Pulmonary:     Effort: Pulmonary effort is normal. No respiratory distress.     Breath sounds: Normal breath sounds. No wheezing or rhonchi.   Abdominal:     General: Abdomen is flat. Bowel sounds are normal.     Palpations: Abdomen is soft.     Tenderness: There is abdominal tenderness in the suprapubic area. There is no right CVA tenderness or left CVA tenderness.  Skin:    General: Skin is warm.  Neurological:     Mental Status: She is alert and oriented to person, place, and time.  Psychiatric:        Mood and Affect: Mood normal.        Behavior: Behavior normal.      UC Treatments / Results  Labs (all labs ordered are listed, but only abnormal results are displayed) Labs Reviewed  POCT URINE DIPSTICK - Abnormal; Notable for the following components:      Result Value   Clarity, UA cloudy (*)    Ketones, POC UA trace (5) (*)    POC PROTEIN,UA =100 (*)    Leukocytes, UA Trace (*)    All other components within normal limits  POCT URINE PREGNANCY - Normal  URINE CULTURE    EKG   Radiology No results found.  Procedures Procedures (including critical care time)  Medications Ordered in UC Medications - No data to display  Initial Impression / Assessment and Plan / UC Course  I have reviewed the triage vital signs and the nursing notes.  Pertinent labs & imaging results that were available during my care of the patient were reviewed by me and considered in my medical decision making (see chart for details).    Final Clinical Impressions(s) / UC Diagnoses   Final diagnoses:  Nausea and vomiting, unspecified vomiting type  Acute cystitis without hematuria   Discharge Instructions   None    ED Prescriptions     Medication Sig Dispense Auth. Provider   nitrofurantoin, macrocrystal-monohydrate, (MACROBID) 100 MG capsule Take 1 capsule (100 mg total) by mouth 2 (two) times daily. 10 capsule Andra Corean BROCKS, [name]      PDMP not reviewed this encounter.   Andra Corean BROCKS, [name] 07/31/24 1851
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
## 2024-08-02 LAB — URINE CULTURE: Culture: 3000 — AB

## 2024-08-03 ENCOUNTER — Ambulatory Visit (HOSPITAL_COMMUNITY): Payer: Self-pay

## 2024-08-24 ENCOUNTER — Emergency Department (HOSPITAL_COMMUNITY)
Admission: EM | Admit: 2024-08-24 | Discharge: 2024-08-25 | Disposition: A | Discharge status: Home / Self Care | Attending: Emergency Medicine | Admitting: Emergency Medicine

## 2024-08-24 ENCOUNTER — Other Ambulatory Visit: Payer: Self-pay

## 2024-08-24 ENCOUNTER — Encounter (HOSPITAL_COMMUNITY): Payer: Self-pay

## 2024-08-24 DIAGNOSIS — Z91040 Latex allergy status: Secondary | ICD-10-CM | POA: Diagnosis not present

## 2024-08-24 DIAGNOSIS — F10129 Alcohol abuse with intoxication, unspecified: Secondary | ICD-10-CM | POA: Insufficient documentation

## 2024-08-24 DIAGNOSIS — R569 Unspecified convulsions: Secondary | ICD-10-CM

## 2024-08-24 DIAGNOSIS — F10920 Alcohol use, unspecified with intoxication, uncomplicated: Secondary | ICD-10-CM

## 2024-08-24 DIAGNOSIS — R000 Tachycardia, unspecified: Secondary | ICD-10-CM | POA: Insufficient documentation

## 2024-08-24 DIAGNOSIS — G40909 Epilepsy, unspecified, not intractable, without status epilepticus: Secondary | ICD-10-CM | POA: Insufficient documentation

## 2024-08-24 DIAGNOSIS — Z79899 Other long term (current) drug therapy: Secondary | ICD-10-CM | POA: Insufficient documentation

## 2024-08-24 DIAGNOSIS — R064 Hyperventilation: Secondary | ICD-10-CM | POA: Diagnosis not present

## 2024-08-24 LAB — CBC
HCT: 42.5 % (ref 36.0–46.0)
Hemoglobin: 13.9 g/dL (ref 12.0–15.0)
MCH: 29.9 pg (ref 26.0–34.0)
MCHC: 32.7 g/dL (ref 30.0–36.0)
MCV: 91.4 fL (ref 80.0–100.0)
Platelets: 282 K/uL (ref 150–400)
RBC: 4.65 MIL/uL (ref 3.87–5.11)
RDW: 13 % (ref 11.5–15.5)
WBC: 4.5 K/uL (ref 4.0–10.5)
nRBC: 0 % (ref 0.0–0.2)

## 2024-08-24 LAB — COMPREHENSIVE METABOLIC PANEL WITH GFR
ALT: 11 U/L (ref 0–44)
AST: 28 U/L (ref 15–41)
Albumin: 4.7 g/dL (ref 3.5–5.0)
Alkaline Phosphatase: 52 U/L (ref 38–126)
Anion gap: 19 — ABNORMAL HIGH (ref 5–15)
BUN: 12 mg/dL (ref 6–20)
CO2: 17 mmol/L — ABNORMAL LOW (ref 22–32)
Calcium: 9.5 mg/dL (ref 8.9–10.3)
Chloride: 104 mmol/L (ref 98–111)
Creatinine, Ser: 0.63 mg/dL (ref 0.44–1.00)
GFR, Estimated: >60 mL/min
Glucose, Bld: 86 mg/dL (ref 70–99)
Potassium: 3.9 mmol/L (ref 3.5–5.1)
Sodium: 140 mmol/L (ref 135–145)
Total Bilirubin: 0.3 mg/dL (ref 0.0–1.2)
Total Protein: 8.4 g/dL — ABNORMAL HIGH (ref 6.5–8.1)

## 2024-08-24 LAB — ETHANOL: Alcohol, Ethyl (B): 121 mg/dL — ABNORMAL HIGH

## 2024-08-24 MED ORDER — ONDANSETRON HCL 4 MG/2ML IJ SOLN
4.0000 mg | Freq: Once | INTRAMUSCULAR | Status: AC
  Administered 2024-08-24: 4 mg via INTRAVENOUS
  Filled 2024-08-24: qty 2

## 2024-08-24 MED ORDER — SODIUM CHLORIDE 0.9 % IV BOLUS
1000.0000 mL | Freq: Once | INTRAVENOUS | Status: AC
  Administered 2024-08-24: 1000 mL via INTRAVENOUS

## 2024-08-24 NOTE — Discharge Instructions (Addendum)
 Your workup today was reassuring other than your alcohol intoxication which I suspect is why you may have had a seizure.  I would refrain from continued alcohol use, and continue taking your home seizure medication.

## 2024-08-24 NOTE — ED Provider Notes (Signed)
 " [place] [facility] Provider Note   CSN: [id number] Arrival date & time: 08/24/24  2221     Patient presents with: Seizures   [name] is a 23 y.o. female with past medical history significant for psychotic disorder, seizure-like activity who presents concern for reported witnessed seizure.  Patient reports that she has been drinking a lot of liquor today although she cannot tell me exactly how much.  She reports that she has been drinking liquor for the last several days because of some family in town to drink a lot.  Reported seizure-like activity, although EMS reports some purposeful movements.  She is not postictal, responding at her normal mental state at time of my evaluation.  She denies any pain, she denies head injury.  She reports 1 episode of mucus-like vomiting.    Seizures      Prior to Admission medications  Medication Sig Start Date End Date Taking? Authorizing Provider  cetirizine  (ZYRTEC  ALLERGY) 10 MG tablet Take 1 tablet (10 mg total) by mouth at bedtime. 04/26/24 07/25/24  Joesph Shaver Scales, [name]  methocarbamol  (ROBAXIN ) 500 MG tablet Take 1 tablet (500 mg total) by mouth 2 (two) times daily. 07/12/24   Hoy Nidia FALCON, [name]  nitrofurantoin , macrocrystal-monohydrate, (MACROBID ) 100 MG capsule Take 1 capsule (100 mg total) by mouth 2 (two) times daily. 07/31/24   Andra Krabbe C, [name]  Oxcarbazepine  (TRILEPTAL ) 300 MG tablet TAKE 1 TABLET BY MOUTH 2 TIMES DAILY. 04/08/24   Camara, Amadou, MD  loratadine  (CLARITIN ) 10 MG tablet Take 1 tablet (10 mg total) by mouth daily. 10/15/18 01/28/21  Rosalynn Camie CROME, MD    Allergies: Lamotrigine , Codeine , Latex, Levetiracetam , and Tdap [tetanus-diphth-acell pertussis]    Review of Systems  Neurological:  Positive for seizures.  All other systems reviewed and are negative.   Updated Vital Signs BP 119/87 (BP Location: Right Arm)   Pulse 99   Temp 98.3 F (36.8 C) (Oral)    Resp 18   Ht 5' 5 (1.651 m)   Wt 48.5 kg   SpO2 100%   BMI 17.81 kg/m   Physical Exam Vitals and nursing note reviewed.  Constitutional:      General: She is not in acute distress.    Appearance: Normal appearance.     Comments: Clinically intoxicated  HENT:     Head: Normocephalic and atraumatic.  Eyes:     General:        Right eye: No discharge.        Left eye: No discharge.  Cardiovascular:     Rate and Rhythm: Normal rate and regular rhythm.     Heart sounds: No murmur heard.    No friction rub. No gallop.  Pulmonary:     Effort: Pulmonary effort is normal.     Breath sounds: Normal breath sounds.  Abdominal:     General: Bowel sounds are normal.     Palpations: Abdomen is soft.  Skin:    General: Skin is warm and dry.     Capillary Refill: Capillary refill takes less than 2 seconds.  Neurological:     Mental Status: She is alert and oriented to person, place, and time.     Comments: Moving all 4 limbs spontaneously, following all commands spontaneously  Psychiatric:        Mood and Affect: Mood normal.        Behavior: Behavior normal.     (all labs ordered are listed, but  only abnormal results are displayed) Labs Reviewed  COMPREHENSIVE METABOLIC PANEL WITH GFR - Abnormal; Notable for the following components:      Result Value   CO2 17 (*)    Total Protein 8.4 (*)    Anion gap 19 (*)    All other components within normal limits  ETHANOL - Abnormal; Notable for the following components:   Alcohol, Ethyl (B) 121 (*)    All other components within normal limits  CBC    EKG: EKG Interpretation Date/Time:  Monday August 24 2024 22:33:59 EST Ventricular Rate:  105 PR Interval:  128 QRS Duration:  71 QT Interval:  342 QTC Calculation: 452 R Axis:   79  Text Interpretation: Sinus tachycardia No significant change since last tracing Confirmed by Bernard Drivers ([id number]) on 08/24/2024 10:45:33 PM  Radiology: No results found.   Procedures    Medications Ordered in the ED  sodium chloride  0.9 % bolus 1,000 mL (1,000 mLs Intravenous New Bag/Given 08/24/24 2245)  ondansetron  (ZOFRAN ) injection 4 mg (4 mg Intravenous Given 08/24/24 2244)                                    Medical Decision Making  This patient is a 23 y.o. female  who presents to the ED for concern of seizure-like activity, alcohol intoxication.   Differential diagnoses prior to evaluation: The emergent differential diagnosis includes, but is not limited to, seizures, syncope, PNES, versus other. This is not an exhaustive differential.   Past Medical History / Co-morbidities / Social History: psychotic disorder, seizure-like activity  Physical Exam: Physical exam performed. The pertinent findings include: Clinically intoxicated but otherwise well-appearing, moving all 4 limbs spontaneously, no focal neurologic deficits.  No evidence of head injury or other trauma.  Lab Tests/Imaging studies: I personally interpreted labs/imaging and the pertinent results include: CBC unremarkable, CMP notable for bicarb deficit, CO2 17, anion gap of 19, likely secondary to alcohol intoxication, ethanol 121.   Cardiac monitoring: EKG obtained and interpreted by myself and attending physician which shows: Sinus tachycardia, no significant change from last tracing, on reassessment tachycardia improved, normal sinus rhythm   Medications: I ordered medication including Zofran , fluid bolus for vomiting, alcohol intoxication.  I have reviewed the patients home medicines and have made adjustments as needed.   Disposition: After consideration of the diagnostic results and the patients response to treatment, I feel that patient with known seizure disorder, no evidence of ongoing seizure activity, overall well-appearing other than alcohol intoxication, overall no acute pathology identified requiring further emergency department evaluation, stable for discharge at this time.    emergency department workup does not suggest an emergent condition requiring admission or immediate intervention beyond what has been performed at this time. The plan is: as above. The patient is safe for discharge and has been instructed to return immediately for worsening symptoms, change in symptoms or any other concerns.   Final diagnoses:  Seizure-like activity [facility])  Alcoholic intoxication without complication    ED Discharge Orders     None          Rosan Sherlean DEL, [name] 08/24/24 2350  "

## 2024-08-24 NOTE — ED Triage Notes (Signed)
 Pt bib [facility]. Family told ems she was upstairs when she felt nauseous and threw up mucus and then had a 2 minute seizure. A/o x4.  Denies any pain. Does report drinking alcohol tonight. Hx of seizures.

## 2024-09-28 ENCOUNTER — Ambulatory Visit: Payer: Self-pay
